# Patient Record
Sex: Female | Born: 1985
Health system: Southern US, Community
[De-identification: ages and names within clinical notes are randomized; demographics above are authoritative.]

## PROBLEM LIST (undated history)

## (undated) DIAGNOSIS — R569 Unspecified convulsions: Secondary | ICD-10-CM

## (undated) DIAGNOSIS — G039 Meningitis, unspecified: Secondary | ICD-10-CM

## (undated) DIAGNOSIS — E162 Hypoglycemia, unspecified: Secondary | ICD-10-CM

## (undated) DIAGNOSIS — F53 Postpartum depression: Secondary | ICD-10-CM

## (undated) DIAGNOSIS — F411 Generalized anxiety disorder: Principal | ICD-10-CM

## (undated) DIAGNOSIS — F419 Anxiety disorder, unspecified: Secondary | ICD-10-CM

## (undated) DIAGNOSIS — R6889 Other general symptoms and signs: Secondary | ICD-10-CM

## (undated) DIAGNOSIS — G9389 Other specified disorders of brain: Secondary | ICD-10-CM

## (undated) HISTORY — DX: Hypoglycemia, unspecified: E16.2

## (undated) HISTORY — DX: Generalized anxiety disorder: F41.1

## (undated) HISTORY — DX: Unspecified convulsions: R56.9

## (undated) HISTORY — DX: Other specified disorders of brain: G93.89

## (undated) HISTORY — PX: TYMPANOSTOMY TUBE PLACEMENT: SHX32

## (undated) HISTORY — DX: Meningitis, unspecified: G03.9

## (undated) HISTORY — DX: Other general symptoms and signs: R68.89

## (undated) HISTORY — DX: Anxiety disorder, unspecified: F41.9

## (undated) HISTORY — PX: EYE SURGERY: SHX253

---
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
## 2015-10-27 ENCOUNTER — Ambulatory Visit (INDEPENDENT_AMBULATORY_CARE_PROVIDER_SITE_OTHER): Payer: BLUE CROSS/BLUE SHIELD | Admitting: Neurology

## 2015-10-27 ENCOUNTER — Encounter: Payer: Self-pay | Admitting: Neurology

## 2015-10-27 VITALS — BP 125/71 | HR 81 | Ht 61.0 in | Wt 106.0 lb

## 2015-10-27 DIAGNOSIS — Z8661 Personal history of infections of the central nervous system: Secondary | ICD-10-CM | POA: Diagnosis not present

## 2015-10-27 DIAGNOSIS — R5383 Other fatigue: Secondary | ICD-10-CM

## 2015-10-27 DIAGNOSIS — R930 Abnormal findings on diagnostic imaging of skull and head, not elsewhere classified: Secondary | ICD-10-CM

## 2015-10-27 NOTE — Progress Notes (Signed)
PATIENT: Chelsea Collins DOB: 05-02-1986  Chief Complaint  Patient presents with  . Heat Intolerance    She has been having intermittent difficulty with heat intolerance.  When this occurs, she also experiences blurred vision, weakness and fatigue.  She has a history of bacterial meningitis and abnormal MRI scans.     HISTORICAL  Chelsea Collins is a 30 years old right-handed female, seen in refer by [facility] Valora Piccolo in October 27 2015 for evaluation of intermittent blurry vision, heat intolerance, fatigue  I reviewed summarized previous neurology record from [facility] by Dr. Clare Gandy June 2012 to October 2014, she was evaluated for sudden onset left side numbness, weakness, she did have a history of infant meningitis, strabismus, field deficit on the left side  She had a history of partial seizure had childhood, last seizure was reported at age 19, presented with staring spells, she was treated with phenobarbital then, was tapered off at age 27, she deny recurrent seizure, but previous neurology note described episodes of staring spells, sudden onset intense conversation without patient wearing off it, there was also episode she was taken to the emergency room found lost in the campus, herself did not know why she was there,  She contributed her previous symptoms for anxiety when she was under a lot of pressure school  Per record, EEG showed no significant abnormality  I was able to review MRI of the brain without contrast from [facility] October 2014, bilateral occipital and encephalomalacia, left worse than right. Visual evoked potential was prolonged on the left side.  Laboratory in October 15 2015, showed normal CMP, TSH, CBC with hemoglobin of 13 point 4, creatinine was 0.63  She denies recurrent seizure, but reported episodes of sudden onset feeling extreme fatigue, blurry vision usually triggered by heat, this happened more frequent in the past few  weeks, per patient, has resolved with decreased the room temperature, there was one particular episode, she complains of blurry vision, to the point of cannot tell apart for 2 people in front of her.  REVIEW OF SYSTEMS: Full 14 system review of systems performed and notable only for fatigue, blurred vision, ALLERGIES: No Known Allergies  HOME MEDICATIONS: Current Outpatient Prescriptions  Medication Sig Dispense Refill  . mirtazapine (REMERON) 15 MG tablet Take by mouth.     No current facility-administered medications for this visit.    PAST MEDICAL HISTORY: Past Medical History  Diagnosis Date  . Meningitis   . Anxiety disorder   . Heat intolerance     PAST SURGICAL HISTORY: Past Surgical History  Procedure Laterality Date  . Eye surgery    . Tympanostomy tube placement      FAMILY HISTORY: Family History  Problem Relation Age of Onset  . Hypertension Maternal Grandfather   . Heart disease Maternal Grandfather     SOCIAL HISTORY:  Social History   Social History  . Marital Status: Single    Spouse Name: N/A  . Number of Children: 0  . Years of Education: 14   Occupational History  . Student     [employer]   Social History Main Topics  . Smoking status: Never Smoker   . Smokeless tobacco: Not on file  . Alcohol Use: No  . Drug Use: No  . Sexual Activity: Not on file   Other Topics Concern  . Not on file   Social History Narrative   Lives with two roommates.     Right-handed.  Two 12oz cans of soda daily.        PHYSICAL EXAM   Filed Vitals:   10/27/15 1017  BP: 125/71  Pulse: 81  Height:  (1.549 m)  Weight: 106 lb (48.081 kg)    Not recorded      Body mass index is 20.04 kg/(m^2).  PHYSICAL EXAMNIATION:  Gen: NAD, conversant, well nourised, obese, well groomed                     Cardiovascular: Regular rate rhythm, no peripheral edema, warm, nontender. Eyes: Conjunctivae clear without exudates or hemorrhage Neck: Supple, no  carotid bruise. Pulmonary: Clear to auscultation bilaterally   NEUROLOGICAL EXAM:  MENTAL STATUS: Speech:    Speech is normal; fluent and spontaneous with normal comprehension.  Cognition:     Orientation to time, place and person     Normal recent and remote memory     Normal Attention span and concentration     Normal Language, naming, repeating,spontaneous speech     Fund of knowledge   CRANIAL NERVES: CN II: Visual fields are full to confrontation. Fundoscopic exam is normal with sharp discs and no vascular changes. Pupils are round equal and briskly reactive to light. CN III, IV, VI: Right is dominant, she has bilateral, she has bilateral esotropia. CN V: Facial sensation is intact to pinprick in all 3 divisions bilaterally. Corneal responses are intact.  CN VII: Face is symmetric with normal eye closure and smile. CN VIII: Hearing is normal to rubbing fingers CN IX, X: Palate elevates symmetrically. Phonation is normal. CN XI: Head turning and shoulder shrug are intact CN XII: Tongue is midline with normal movements and no atrophy.  MOTOR: There is no pronator drift of out-stretched arms. Muscle bulk and tone are normal. Muscle strength is normal.  REFLEXES: Reflexes are 2+ and symmetric at the biceps, triceps, knees, and ankles. Plantar responses are flexor.  SENSORY: Intact to light touch, pinprick, position sense, and vibration sense are intact in fingers and toes.  COORDINATION: Rapid alternating movements and fine finger movements are intact. There is no dysmetria on finger-to-nose and heel-knee-shin.    GAIT/STANCE: She tends to walk with bilateral toes pointed inward, steady   DIAGNOSTIC DATA (LABS, IMAGING, TESTING) - I reviewed patient records, labs, notes, testing and imaging myself where available.   ASSESSMENT AND PLAN  Chelsea Collins is a 30 y.o. female    History of infantile meningitis, bilateral occipital encephalomalacia Previous history of  complex partial seizure Recurrent episode of transient confusion, blurry vision, heat intolerance, fatigue,  Complex partial seizure remain on the differentiation diagnosis,  I have suggested a repeat EEG, even consider empirically treatment with lamotrigine was frequent occurrence,  She decided not to proceed with the test or medication treatment  I have advised her continued document all event, call clinic for any episodes suspicious for seizure  Levert Feinstein, M.D. Ph.D.  [facility] [address] [place], [place] [address] Ph: [phone] Fax: [phone]  CC: Valora Piccolo, PA-C

## 2015-11-20 ENCOUNTER — Telehealth: Payer: Self-pay | Admitting: Neurology

## 2015-11-20 NOTE — Telephone Encounter (Signed)
Pt called about maybe starting any mediation that was suggested. She wants to know if it will affect her in any way. She is an Engineer, mining and wants to know how or if it will change anything for her in the future. Will it impact treating other people with disabilities. She also would like to know if Dr. Terrace Arabia can speak with her Psychiatrist before prescribing medication to make sure medication is compatible or not.  Dr. Ralene Muskrat phone: [phone]. Please call and advise pt at  [phone]

## 2015-11-21 NOTE — Telephone Encounter (Signed)
Pt called back today, said was calling about starting any medication that Dr Terrace ArabiaYan suggest. 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 Is there anything else would be out? And could Dr Terrace ArabiaYan call psych.

## 2015-11-24 MED ORDER — LAMOTRIGINE 100 MG PO TABS
100.0000 mg | ORAL_TABLET | Freq: Two times a day (BID) | ORAL | Status: DC
Start: 2015-11-24 — End: 2015-12-12

## 2015-11-24 MED ORDER — LAMOTRIGINE 25 MG PO TABS: ORAL_TABLET | ORAL | Status: DC

## 2015-11-24 NOTE — Addendum Note (Signed)
Addended by: Levert FeinsteinYAN, Murice Barbar on: 11/24/2015 01:50 PM   Modules accepted: Orders

## 2015-11-24 NOTE — Telephone Encounter (Signed)
Rx placed up front for pick up - patient aware.  She will keep her pending appts for EEG and follow up.

## 2015-11-24 NOTE — Telephone Encounter (Signed)
Patient called back and would like a return call [phone].  She goes to work @ 1:30 but may also be reached after 5:00 at same number.  Thanks!

## 2015-11-24 NOTE — Telephone Encounter (Signed)
[name], please let patient know, I was able to tolerate with a psychiatrist Dr. Ralene Muskratoreen Hughes Fax [phone], office No. [phone].  She had a diagnosis of generalized anxiety disorder, Dr. Kizzie BaneHughes agreed on Lamotrigine, start from 25 mg, titrating to 100 mg twice a day,

## 2015-11-24 NOTE — Telephone Encounter (Addendum)
I have called her psychiatrist Dr. Ralene Muskratoreen Hughes phone: [phone]. Left message twice  I also called patient at  [phone], left message, I have suggested EEG, lamotrigine treatment at her initial visit October 27 2015, she refused both  [name]:  Please call patient, check on her symptoms, we may start lamotrigine treatment, titrating to 100 mg twice a day, potential side effect includes lightheadedness, dizziness, rash,  She should also have EEG, and a follow-up appointment

## 2015-11-26 ENCOUNTER — Telehealth: Payer: Self-pay | Admitting: Neurology

## 2015-11-26 NOTE — Telephone Encounter (Signed)
Left message for a return call

## 2015-11-26 NOTE — Telephone Encounter (Signed)
Reviewed dosing instructions with patient again.  She verbalized understanding.

## 2015-11-26 NOTE — Telephone Encounter (Signed)
She has some questions on the RX that she was giving she wants to know how long she is taking 3 Times a day before she moves up to the full dose. The best number to contact patient is [phone]

## 2015-12-11 ENCOUNTER — Telehealth: Payer: Self-pay | Admitting: Neurology

## 2015-12-11 NOTE — Telephone Encounter (Signed)
The patient called, she has recent started Lamictal within the last 3 weeks, she has developed a slightly tender swollen lymph node just under the jaw on the right side. No associated sore throat, fevers. I do not think that this is a drug allergy, the patient believes that the lymph node is reducing in size at this point. She is to monitor this, if more diffuse involvement of lymph nodes is noted, she is to go to her primary care physician. I would continue the Lamictal for now. There has been no rash.

## 2015-12-12 MED ORDER — LEVETIRACETAM 500 MG PO TABS: ORAL_TABLET | ORAL | Status: DC

## 2015-12-12 NOTE — Telephone Encounter (Signed)
Patient called back to advise she went to PCP and they didn't find any infection. Patient states she now has a rash on arms and legs.

## 2015-12-12 NOTE — Telephone Encounter (Signed)
I called the patient, left a message. The patient will stop the Lamictal. I will call in a prescription for the Keppra that she will start once the rash goes away.

## 2015-12-12 NOTE — Addendum Note (Signed)
Addended by: Stephanie AcreWILLIS, CHARLES on: 12/12/2015 01:40 PM   Modules accepted: Orders, Medications

## 2015-12-12 NOTE — Telephone Encounter (Signed)
Pt called back.  [facility].  She now has rash on legs and upper arms.(red, blotchy).  No other sx, other then swollen lymph glands in front slight larger.  She is not running fever, is tired.  Did have mono test done and was negative.   Will have a repeat on Monday when [facility].  She is on lamictal 150mg  po bid starting today.  (1200-2200) .  Has not taken today.  ? Continue taking lamictal.  Will send to [facility] today.  Told to hold until consulted.  She is using mobile phone.(Home #).

## 2015-12-17 ENCOUNTER — Ambulatory Visit (INDEPENDENT_AMBULATORY_CARE_PROVIDER_SITE_OTHER): Payer: BLUE CROSS/BLUE SHIELD | Admitting: Neurology

## 2015-12-17 ENCOUNTER — Telehealth: Payer: Self-pay | Admitting: Neurology

## 2015-12-17 DIAGNOSIS — R569 Unspecified convulsions: Secondary | ICD-10-CM | POA: Diagnosis not present

## 2015-12-17 NOTE — Procedures (Signed)
   HISTORY: [age] years old female, with history of seizure, bilateral occipital encephalomalacia, presented with intermittent confusion episodes  TECHNIQUE:  16 channel EEG was performed based on standard 10-16 international system. One channel was dedicated to EKG, which has demonstrates sinus rhythm of 54 beats per minutes.  Upon awakening, the posterior background activity was well-developed, in alpha range, 8-9 Hz, reactive to eye opening and closure.  Photic stimulation was performed, which induced a symmetric photic driving.  Hyperventilation was performed twice, during hyperventilation, there was intermittent short bursts of generalized spike sharp wave, which is most noticeable at age 30, 6770, 8171, 66166.  Stage II sleep was achieved.  CONCLUSION: This is an abnormal EEG.  There is electrodiagnostic evidence of short bursting of spikes slow waves induced by hyperventilation, indicating a generalized epilepsy disorder

## 2015-12-17 NOTE — Telephone Encounter (Signed)
I have called Chelsea Collins, her EEG showed bilateral generalized spike slow waves, consistent with epileptiform discharge, she had abnormal MRI of the brain, bilateral occipital encephalomalacia, she continue have spells of confusion, we have tried her on lamotrigine early March 2017, she noticed that bilateral upper extremity rash, was stopped, Dr. Anne HahnWillis has called in Keppra 500 twice a day December 12 2015, she has not started the medication yet  I have advised her to start Keppra 500 mg twice a day, keep follow-up in December 24 2015

## 2015-12-22 ENCOUNTER — Telehealth: Payer: Self-pay | Admitting: Neurology

## 2015-12-22 ENCOUNTER — Ambulatory Visit (INDEPENDENT_AMBULATORY_CARE_PROVIDER_SITE_OTHER): Payer: BLUE CROSS/BLUE SHIELD | Admitting: Neurology

## 2015-12-22 ENCOUNTER — Encounter: Payer: Self-pay | Admitting: Neurology

## 2015-12-22 VITALS — BP 103/69 | HR 95 | Ht 61.0 in | Wt 106.0 lb

## 2015-12-22 DIAGNOSIS — Z8661 Personal history of infections of the central nervous system: Secondary | ICD-10-CM | POA: Diagnosis not present

## 2015-12-22 DIAGNOSIS — R569 Unspecified convulsions: Secondary | ICD-10-CM

## 2015-12-22 MED ORDER — CETIRIZINE HCL 10 MG PO TABS: 10.0000 mg | ORAL_TABLET | Freq: Every day | ORAL | Status: DC

## 2015-12-22 MED ORDER — METHYLPREDNISOLONE 4 MG PO TBPK: ORAL_TABLET | ORAL | Status: DC

## 2015-12-22 MED ORDER — METHYLPREDNISOLONE 4 MG PO TBPK
ORAL_TABLET | ORAL | Status: DC
Start: 2015-12-22 — End: 2015-12-22

## 2015-12-22 MED ORDER — ALPRAZOLAM 0.5 MG PO TABS: ORAL_TABLET | ORAL | Status: DC

## 2015-12-22 NOTE — Telephone Encounter (Signed)
Chelsea Collins called on April second 2017, weekend, she complains of diffuse rash after taking Keppra, I have advised her stopped taking Keppra keep herself well hydration, she has follow-up appointment in December 24 2015  [name], please call patient check on her again,

## 2015-12-22 NOTE — Telephone Encounter (Signed)
Dr. Terrace ArabiaYan spoke with patient and she is being worked into the schedule today.

## 2015-12-22 NOTE — Telephone Encounter (Signed)
Pt called sts her eyes are almost swollen shut, face and neck swollen and is covered in a rash. This call was transferred to Dr Terrace ArabiaYan.

## 2015-12-22 NOTE — Progress Notes (Signed)
Chief Complaint  Patient presents with  . Seizures    She is here to discuss a medication reaction to Keppra.  She started devolping a rash and is now experiencing facial/neck swelling.  She also had a rash with Lamictal.      PATIENT: Chelsea Collins DOB: 1986-02-26  Chief Complaint  Patient presents with  . Seizures    She is here to discuss a medication reaction to Keppra.  She started devolping a rash and is now experiencing facial/neck swelling.  She also had a rash with Lamictal.     HISTORICAL  Chelsea Collins is a 30 years old right-handed female, seen in refer by [facility] Valora Piccolo in October 27 2015 for evaluation of intermittent blurry vision, heat intolerance, fatigue  I reviewed summarized previous neurology record from [facility] by Dr. Clare Gandy June 2012 to October 2014, she was evaluated for sudden onset left side numbness, weakness, she did have a history of infant meningitis, strabismus, field deficit on the left side  She had a history of partial seizure had childhood, last seizure was reported at age 30, presented with staring spells, she was treated with phenobarbital then, was tapered off at age 20, she deny recurrent seizure, but previous neurology note described episodes of staring spells, sudden onset intense conversation without patient wearing off it, there was also episode she was taken to the emergency room found lost in the campus, herself did not know why she was there,  She contributed her previous symptoms for anxiety when she was under a lot of pressure school  Per record, EEG showed no significant abnormality  I was able to review MRI of the brain without contrast from [facility] October 2014, bilateral occipital and encephalomalacia, left worse than right. Visual evoked potential was prolonged on the left side.  Laboratory in October 15 2015, showed normal CMP, TSH, CBC with hemoglobin of 13 point 4, creatinine was  0.63  She denies recurrent seizure, but reported episodes of sudden onset feeling extreme fatigue, blurry vision usually triggered by heat, this happened more frequent in the past few weeks, per patient, has resolved with decreased the room temperature, there was one particular episode, she complains of blurry vision, to the point of cannot tell apart for 2 people in front of her.  UPDATE December 22 2015: She was started on lamotrigine titrating dose since early March 2017, did complain of rash at bilateral anterior thigh, lamotrigine was stopped in December 11 2015, she has not taking any antiepileptic medication from March 24 to December 17 2015, the rash from lamotrigine at bilateral anterior thigh disappeared in 2-3 days around December 14 2015.   I called her for abnormal EEG in December 17 2015 which showed evidence of generalized epileptiform discharge, she started taking Keppra 500 mg half tablet twice a day since December 18 2015, she totally only take 5 of half tablet of Keppra 500 mg, by Saturday April first 2017, she noticed rash at bilateral lateral arm, then quickly spread to involving her arms, face, trunk, has tried Benadryl without benefit, last dose of Keppra 250 mg was April first 2017.  She has no recurrent seizure.  REVIEW OF SYSTEMS: Full 14 system review of systems performed and notable only for fatigue, blurred vision, ALLERGIES: Allergies  Allergen Reactions  . Keppra [Levetiracetam] Swelling and Rash  . Lamictal [Lamotrigine] Rash    HOME MEDICATIONS: Current Outpatient Prescriptions  Medication Sig Dispense Refill  . DiphenhydrAMINE HCl (BENADRYL PO)  Take by mouth as needed.    . mirtazapine (REMERON) 15 MG tablet Take by mouth.     No current facility-administered medications for this visit.    PAST MEDICAL HISTORY: Past Medical History  Diagnosis Date  . Meningitis   . Anxiety disorder   . Heat intolerance     PAST SURGICAL HISTORY: Past Surgical History  Procedure  Laterality Date  . Eye surgery    . Tympanostomy tube placement      FAMILY HISTORY: Family History  Problem Relation Age of Onset  . Hypertension Maternal Grandfather   . Heart disease Maternal Grandfather     SOCIAL HISTORY:  Social History   Social History  . Marital Status: Single    Spouse Name: N/A  . Number of Children: 0  . Years of Education: 14   Occupational History  . Student     [employer]   Social History Main Topics  . Smoking status: Never Smoker   . Smokeless tobacco: Not on file  . Alcohol Use: No  . Drug Use: No  . Sexual Activity: Not on file   Other Topics Concern  . Not on file   Social History Narrative   Lives with two roommates.     Right-handed.   Two 12oz cans of soda daily.        PHYSICAL EXAM   Filed Vitals:   12/22/15 1240  BP: 103/69  Pulse: 95  Height:  (1.549 m)  Weight: 106 lb (48.081 kg)    Not recorded      Body mass index is 20.04 kg/(m^2).  PHYSICAL EXAMNIATION:  Gen: NAD, conversant, well nourised, obese, well groomed                     Cardiovascular: Regular rate rhythm, no peripheral edema, warm, nontender. Eyes: Conjunctivae clear without exudates or hemorrhage Neck: Supple, no carotid bruise. Pulmonary: Clear to auscultation bilaterally  Skin: She has diffuse erythematous rash fused together at her face, arms, chest,  NEUROLOGICAL EXAM:  MENTAL STATUS: Speech:    Speech is normal; fluent and spontaneous with normal comprehension.  Cognition:     Orientation to time, place and person     Normal recent and remote memory     Normal Attention span and concentration     Normal Language, naming, repeating,spontaneous speech     Fund of knowledge   CRANIAL NERVES: CN II: Visual fields are full to confrontation. Fundoscopic exam is normal with sharp discs and no vascular changes. Pupils are round equal and briskly reactive to light. CN III, IV, VI: Right is dominant, she has bilateral, she has  bilateral esotropia. CN V: Facial sensation is intact to pinprick in all 3 divisions bilaterally. Corneal responses are intact.  CN VII: Face is symmetric with normal eye closure and smile. CN VIII: Hearing is normal to rubbing fingers CN IX, X: Palate elevates symmetrically. Phonation is normal. CN XI: Head turning and shoulder shrug are intact CN XII: Tongue is midline with normal movements and no atrophy.  MOTOR: There is no pronator drift of out-stretched arms. Muscle bulk and tone are normal. Muscle strength is normal.  REFLEXES: Reflexes are 2+ and symmetric at the biceps, triceps, knees, and ankles. Plantar responses are flexor.  SENSORY: Intact to light touch, pinprick, position sense, and vibration sense are intact in fingers and toes.  COORDINATION: Rapid alternating movements and fine finger movements are intact. There is no dysmetria on finger-to-nose and  heel-knee-shin.    GAIT/STANCE: She tends to walk with bilateral toes pointed inward, steady   DIAGNOSTIC DATA (LABS, IMAGING, TESTING) - I reviewed patient records, labs, notes, testing and imaging myself where available.   ASSESSMENT AND PLAN  Chelsea Collins is a 30 y.o. female    History of infantile meningitis, bilateral occipital encephalomalacia Complex partial seizure Severe allergic reaction to Keppra  Could not tolerate lamotrigine, develop a rash at bilateral lower extremity,  Severe allergic reaction to Keppra  I have written Metro pack, Zyrtec  Laboratory evaluations   Levert FeinsteinYijun Fortunato Nordin, M.D. Ph.D.  [facility] [address], [address] [place], [place] [address] Ph: [phone] Fax: [phone]  CC: Valora PiccoloMatthew Strupp, PA-C

## 2015-12-23 ENCOUNTER — Telehealth: Payer: Self-pay | Admitting: Neurology

## 2015-12-23 LAB — COMPREHENSIVE METABOLIC PANEL
A/G RATIO: 1.7 (ref 1.2–2.2)
ALBUMIN: 4.3 g/dL (ref 3.5–5.5)
ALT: 35 IU/L — AB (ref 0–32)
AST: 20 IU/L (ref 0–40)
Alkaline Phosphatase: 54 IU/L (ref 39–117)
BUN / CREAT RATIO: 13 (ref 9–23)
BUN: 8 mg/dL (ref 6–20)
Bilirubin Total: 0.7 mg/dL (ref 0.0–1.2)
CALCIUM: 9.3 mg/dL (ref 8.7–10.2)
CO2: 20 mmol/L (ref 18–29)
Chloride: 102 mmol/L (ref 96–106)
Creatinine, Ser: 0.64 mg/dL (ref 0.57–1.00)
GFR, EST AFRICAN AMERICAN: 140 mL/min/{1.73_m2} (ref >59)
GFR, EST NON AFRICAN AMERICAN: 122 mL/min/{1.73_m2} (ref >59)
Globulin, Total: 2.6 g/dL (ref 1.5–4.5)
Glucose: 92 mg/dL (ref 65–99)
POTASSIUM: 4.4 mmol/L (ref 3.5–5.2)
Sodium: 140 mmol/L (ref 134–144)
TOTAL PROTEIN: 6.9 g/dL (ref 6.0–8.5)

## 2015-12-23 LAB — CBC
Hematocrit: 40.5 % (ref 34.0–46.6)
Hemoglobin: 13.5 g/dL (ref 11.1–15.9)
MCH: 29 pg (ref 26.6–33.0)
MCHC: 33.3 g/dL (ref 31.5–35.7)
MCV: 87 fL (ref 79–97)
PLATELETS: 343 10*3/uL (ref 150–379)
RBC: 4.66 x10E6/uL (ref 3.77–5.28)
RDW: 13.3 % (ref 12.3–15.4)
WBC: 7.5 10*3/uL (ref 3.4–10.8)

## 2015-12-23 LAB — ANA W/REFLEX IF POSITIVE: Anti Nuclear Antibody(ANA): NEGATIVE

## 2015-12-23 NOTE — Telephone Encounter (Signed)
Please call her, laboratory evaluation showed no significant abnormality, check on to make sure she is doing well, rashes is not getting worse.

## 2015-12-23 NOTE — Telephone Encounter (Signed)
Spoke to patient - aware of lab results.  She still has a rash but her swelling is getting better.

## 2015-12-24 ENCOUNTER — Ambulatory Visit: Payer: Self-pay | Admitting: Neurology

## 2015-12-24 NOTE — Telephone Encounter (Signed)
Patient called back regarding allergic reaction to Keppra, facial swelling and rash, states no one has called her back.

## 2015-12-24 NOTE — Telephone Encounter (Signed)
Pt said the rt eyelid is puffy in the morning when the medication wears off. This is only occuring during the night because there is a big gap between doses. It is effective when she takes it during the day. Sts the left eyelid is fine. Pt was upset and sts she is not coming back in that this can be figured out over the phone.

## 2015-12-24 NOTE — Telephone Encounter (Signed)
Spoke to Schering-PloughCrystal - reports her rash and swelling have started to improve but she thought her symptoms would be gone completely within two days of starting her steroids.  I explained to her it will take longer for the medications to work and to continue her medications, as prescribed.  Instructed her to call back with any further concerns.

## 2016-01-07 ENCOUNTER — Encounter: Payer: Self-pay | Admitting: Neurology

## 2016-01-07 ENCOUNTER — Ambulatory Visit (INDEPENDENT_AMBULATORY_CARE_PROVIDER_SITE_OTHER): Payer: BLUE CROSS/BLUE SHIELD | Admitting: Neurology

## 2016-01-07 VITALS — BP 98/63 | HR 62 | Ht 61.0 in | Wt 106.0 lb

## 2016-01-07 DIAGNOSIS — Z8661 Personal history of infections of the central nervous system: Secondary | ICD-10-CM

## 2016-01-07 DIAGNOSIS — R569 Unspecified convulsions: Secondary | ICD-10-CM | POA: Diagnosis not present

## 2016-01-07 DIAGNOSIS — R930 Abnormal findings on diagnostic imaging of skull and head, not elsewhere classified: Secondary | ICD-10-CM | POA: Diagnosis not present

## 2016-01-07 MED ORDER — OXCARBAZEPINE 300 MG PO TABS: 300.0000 mg | ORAL_TABLET | Freq: Two times a day (BID) | ORAL | Status: DC

## 2016-01-07 NOTE — Progress Notes (Signed)
Chief Complaint  Patient presents with  . Seizures    She denies any seizure activity. The rash and swelling from her Keppra reaction has now resolved.      PATIENT: Chelsea Collins DOB: Oct 11, 1985  Chief Complaint  Patient presents with  . Seizures    She denies any seizure activity. The rash and swelling from her Keppra reaction has now resolved.     HISTORICAL  Chelsea Collins is a 30 years old right-handed female, seen in refer by [facility] Valora Piccolo in October 27 2015 for evaluation of intermittent blurry vision, heat intolerance, fatigue  I reviewed summarized previous neurology record from [facility] by Dr. Clare Gandy June 2012 to October 2014, she was evaluated for sudden onset left side numbness, weakness, she did have a history of infant meningitis, strabismus, field deficit on the left side  She had a history of partial seizure had childhood, last seizure was reported at age 11, presented with staring spells, she was treated with phenobarbital then, was tapered off at age 48, she deny recurrent seizure, but previous neurology note described episodes of staring spells, sudden onset intense conversation without patient wearing off it, there was also episode she was taken to the emergency room found lost in the campus, herself did not know why she was there,  She contributed her previous symptoms for anxiety when she was under a lot of pressure school  Per record, EEG showed no significant abnormality  I was able to review MRI of the brain without contrast from [facility] October 2014, bilateral occipital and encephalomalacia, left worse than right. Visual evoked potential was prolonged on the left side.  Laboratory in October 15 2015, showed normal CMP, TSH, CBC with hemoglobin of 13 point 4, creatinine was 0.63  She denies recurrent seizure, but reported episodes of sudden onset feeling extreme fatigue, blurry vision usually triggered by heat, this  happened more frequent in the past few weeks, per patient, has resolved with decreased the room temperature, there was one particular episode, she complains of blurry vision, to the point of cannot tell apart for 2 people in front of her.  UPDATE December 22 2015: She was started on lamotrigine titrating dose since early March 2017, did complain of rash at bilateral anterior thigh, lamotrigine was stopped in December 11 2015, she has not taking any antiepileptic medication from March 24 to December 17 2015, the rash from lamotrigine at bilateral anterior thigh disappeared in 2-3 days around December 14 2015.   I called her for abnormal EEG in December 17 2015 which showed evidence of generalized epileptiform discharge, she started taking Keppra 500 mg half tablet twice a day since December 18 2015, she totally only take 5 of half tablet of Keppra 500 mg, by Saturday April first 2017, she noticed rash at bilateral lateral arm, then quickly spread to involving her arms, face, trunk, has tried Benadryl without benefit, last dose of Keppra 250 mg was April first 2017.  She has no recurrent seizure.  UPDATE April 19th 2017: Her severe allergic rash from Keppra has disappeared, laboratory evaluation showed normal CMP CBC negative ANA,  We have discussed long-term antiepileptic medication choice, reviewed related data at [facility], we decided to proceed with Trileptal 300 mg twice a day, which is pregnancy category C, potential side effect explained to her.    REVIEW OF SYSTEMS: Full 14 system review of systems performed and notable only for fatigue, blurred vision, ALLERGIES: Allergies  Allergen Reactions  .  Keppra [Levetiracetam] Swelling and Rash  . Lamictal [Lamotrigine] Rash    HOME MEDICATIONS: Current Outpatient Prescriptions  Medication Sig Dispense Refill  . ALPRAZolam (XANAX) 0.5 MG tablet Take 1 tablet as needed for seizure 30 tablet 0   No current facility-administered medications for this visit.     PAST MEDICAL HISTORY: Past Medical History  Diagnosis Date  . Meningitis   . Anxiety disorder   . Heat intolerance     PAST SURGICAL HISTORY: Past Surgical History  Procedure Laterality Date  . Eye surgery    . Tympanostomy tube placement      FAMILY HISTORY: Family History  Problem Relation Age of Onset  . Hypertension Maternal Grandfather   . Heart disease Maternal Grandfather     SOCIAL HISTORY:  Social History   Social History  . Marital Status: Single    Spouse Name: N/A  . Number of Children: 0  . Years of Education: 14   Occupational History  . Student     [employer]   Social History Main Topics  . Smoking status: Never Smoker   . Smokeless tobacco: Not on file  . Alcohol Use: No  . Drug Use: No  . Sexual Activity: Not on file   Other Topics Concern  . Not on file   Social History Narrative   Lives with two roommates.     Right-handed.   Two 12oz cans of soda daily.        PHYSICAL EXAM   Filed Vitals:   01/07/16 1336  BP: 98/63  Pulse: 62  Height: 5\' 1"  (1.549 m)  Weight: 106 lb (48.081 kg)    Not recorded      Body mass index is 20.04 kg/(m^2).  PHYSICAL EXAMNIATION:  Gen: NAD, conversant, well nourised, obese, well groomed                     Cardiovascular: Regular rate rhythm, no peripheral edema, warm, nontender. Eyes: Conjunctivae clear without exudates or hemorrhage Neck: Supple, no carotid bruise. Pulmonary: Clear to auscultation bilaterally  Skin: She has diffuse erythematous rash fused together at her face, arms, chest,  NEUROLOGICAL EXAM:  MENTAL STATUS: Speech:    Speech is normal; fluent and spontaneous with normal comprehension.  Cognition:     Orientation to time, place and person     Normal recent and remote memory     Normal Attention span and concentration     Normal Language, naming, repeating,spontaneous speech     Fund of knowledge   CRANIAL NERVES: CN II: Peripheral visual field are severely  decreased bilaterally.. Fundoscopic exam is normal with sharp discs and no vascular changes. Pupils are round equal and briskly reactive to light. CN III, IV, VI: Right is dominant, she has bilateral, she has bilateral esotropia. CN V: Facial sensation is intact to pinprick in all 3 divisions bilaterally. Corneal responses are intact.  CN VII: Face is symmetric with normal eye closure and smile. CN VIII: Hearing is normal to rubbing fingers CN IX, X: Palate elevates symmetrically. Phonation is normal. CN XI: Head turning and shoulder shrug are intact CN XII: Tongue is midline with normal movements and no atrophy.  MOTOR: There is no pronator drift of out-stretched arms. Muscle bulk and tone are normal. Muscle strength is normal.  REFLEXES: Reflexes are 2+ and symmetric at the biceps, triceps, knees, and ankles. Plantar responses are flexor.  SENSORY: Intact to light touch, pinprick, position sense, and vibration sense are intact  in fingers and toes.  COORDINATION: Rapid alternating movements and fine finger movements are intact. There is no dysmetria on finger-to-nose and heel-knee-shin.    GAIT/STANCE: She tends to walk with bilateral toes pointed inward, steady   DIAGNOSTIC DATA (LABS, IMAGING, TESTING) - I reviewed patient records, labs, notes, testing and imaging myself where available.   ASSESSMENT AND PLAN  Chelsea Collins is a 30 y.o. female    History of infantile meningitis, bilateral occipital encephalomalacia Complex partial seizure Severe allergic reaction to Keppra  Could not tolerate lamotrigine, develop a rash at bilateral lower extremity,  Severe allergic reaction to Keppra  After prolonged discussion with Merrie, we decided to start her on Trileptal 300 mg twice a day, it is pregnancy category C, I also reviewed known data on pregnancy registry, there was no reported human abnormality based on the database at [facility].   No driving because severe peripheral  visual field defect  Face to face time was 40 minutes, greater than 50% of the time was spent in counseling and coordination of care with the patient   Levert Feinstein, M.D. Ph.D.  [facility] [address], [address] [place], [place] [address] Ph: [phone] Fax: [phone]  CC: Valora Piccolo, PA-C

## 2016-04-06 ENCOUNTER — Ambulatory Visit (INDEPENDENT_AMBULATORY_CARE_PROVIDER_SITE_OTHER): Payer: BLUE CROSS/BLUE SHIELD | Admitting: Neurology

## 2016-04-06 ENCOUNTER — Encounter: Payer: Self-pay | Admitting: Neurology

## 2016-04-06 VITALS — BP 104/68 | HR 89 | Ht 61.0 in | Wt 96.2 lb

## 2016-04-06 DIAGNOSIS — R5383 Other fatigue: Secondary | ICD-10-CM | POA: Diagnosis not present

## 2016-04-06 DIAGNOSIS — Z8661 Personal history of infections of the central nervous system: Secondary | ICD-10-CM | POA: Diagnosis not present

## 2016-04-06 DIAGNOSIS — R930 Abnormal findings on diagnostic imaging of skull and head, not elsewhere classified: Secondary | ICD-10-CM | POA: Diagnosis not present

## 2016-04-06 DIAGNOSIS — R569 Unspecified convulsions: Secondary | ICD-10-CM

## 2016-04-06 NOTE — Progress Notes (Signed)
Chief Complaint  Patient presents with  . Seizures    She is currently taking Trileptal 300mg , one tablet BID.  Reports having a seizure on 03/09/16.  She has also noticed weight loss and needs to discuss her anticonvulsant and psychiatic medications.  Her psychiatrist is Ralene MuskratDoreen Hughes at [facility] in [place] [phone]).      PATIENT: Chelsea Collins DOB: Oct 10, 1985  Chief Complaint  Patient presents with  . Seizures    She is currently taking Trileptal 300mg , one tablet BID.  Reports having a seizure on 03/09/16.  She has also noticed weight loss and needs to discuss her anticonvulsant and psychiatic medications.  Her psychiatrist is Ralene MuskratDoreen Hughes at [facility] in [place] [phone]).     HISTORICAL  Shivonne Collins is a 30 years old right-handed female, [facility] Valora PiccoloMatthew Strupp in October 27 2015 for evaluation of intermittent blurry vision, heat intolerance, fatigue  I reviewed summarized previous neurology record from [facility] by Dr. Clare Gandyavid Meyer,from June 2012 to October 2014, she was evaluated for sudden onset left side numbness, weakness, she did have a history of infant meningitis, strabismus, field deficit on the left side  She had a history of partial seizure at childhood, last seizure was reported at age 89, presented with staring spells, she was treated with phenobarbital then, was tapered off at age 30, she deny recurrent seizure, but previous neurology note described episodes of staring spells, sudden onset intense conversation without patient aware of it, there was also episode she was taken to the emergency room found lost in the campus, herself did not know why she was there,  She contributed her previous symptoms for anxiety when she was under a lot of pressure school  Per record, EEG showed no significant abnormality  I was able to review MRI of the brain without contrast [facility] October 2014, bilateral  occipital and encephalomalacia, left worse than right. Visual evoked potential was prolonged on the left side.  Laboratory in October 15 2015, showed normal CMP, TSH, CBC with hemoglobin of 13 point 4, creatinine was 0.63  She denies recurrent seizure, but reported episodes of sudden onset feeling extreme fatigue, blurry vision usually triggered by heat, this happened more frequent in the past few weeks, per patient, has resolved with decreased the room temperature, there was one particular episode, she complains of blurry vision, to the point of cannot tell apart for 2 people in front of her.  UPDATE December 22 2015: She was started on lamotrigine titrating dose since early March 2017, did complain of rash at bilateral anterior thigh, lamotrigine was stopped in December 11 2015, she has not taking any antiepileptic medication from March 24 to December 17 2015, the rash from lamotrigine at bilateral anterior thigh disappeared in 2-3 days around December 14 2015.   I called her for abnormal EEG in December 17 2015 which showed evidence of generalized epileptiform discharge, she started taking Keppra 500 mg half tablet twice a day since December 18 2015, she totally only take 5 of half tablet of Keppra 500 mg, by Saturday April first 2017, she noticed rash at bilateral lateral arm, then quickly spread to involving her arms, face, trunk, has tried Benadryl without benefit, last dose of Keppra 250 mg was April first 2017.  She has no recurrent seizure.  UPDATE April 19th 2017: Her severe allergic rash from Keppra has disappeared, laboratory evaluation showed normal CMP CBC negative ANA,  We  have discussed long-term antiepileptic medication choice, reviewed related data at [facility], we decided to proceed with Trileptal 300 mg twice a day, which is pregnancy category C, potential side effect explained to her.   UPDATE July 18th 2017: She had one episode suspicious for seizure in June 2017,  She was sitting at the  edge of the pool with her fee at the water, suddenly she had blurry vision, this happened in this scenario she was forced to swim, when she feel very nervous about it, this particular episode could be panic attack, it is not her typical seizure episodes.  she is tolerating trileptal  bid, She is overly stressed for few months because of her schoolwork,.  She has lost 10 Lb over last 3 months, she has stopped take mirtazapine  REVIEW OF SYSTEMS: Full 14 system review of systems performed and notable only for fatigue, blurred vision, ALLERGIES: Allergies  Allergen Reactions  . Keppra [Levetiracetam] Swelling and Rash  . Lamictal [Lamotrigine] Rash    HOME MEDICATIONS: Current Outpatient Prescriptions  Medication Sig Dispense Refill  . Oxcarbazepine (TRILEPTAL) 300 MG tablet Take 1 tablet (300 mg total) by mouth 2 (two) times daily. 60 tablet 11   No current facility-administered medications for this visit.    PAST MEDICAL HISTORY: Past Medical History  Diagnosis Date  . Meningitis   . Anxiety disorder   . Heat intolerance     PAST SURGICAL HISTORY: Past Surgical History  Procedure Laterality Date  . Eye surgery    . Tympanostomy tube placement      FAMILY HISTORY: Family History  Problem Relation Age of Onset  . Hypertension Maternal Grandfather   . Heart disease Maternal Grandfather     SOCIAL HISTORY:  Social History   Social History  . Marital Status: Single    Spouse Name: N/A  . Number of Children: 0  . Years of Education: 14   Occupational History  . Student     [employer]   Social History Main Topics  . Smoking status: Never Smoker   . Smokeless tobacco: Not on file  . Alcohol Use: No  . Drug Use: No  . Sexual Activity: Not on file   Other Topics Concern  . Not on file   Social History Narrative   Lives with two roommates.     Right-handed.   Two 12oz cans of soda daily.        PHYSICAL EXAM   Filed Vitals:   04/06/16 1414  BP: 104/68    Pulse: 89  Height:  (1.549 m)  Weight: 96 lb 4 oz (43.659 kg)    Not recorded      Body mass index is 18.2 kg/(m^2).  PHYSICAL EXAMNIATION:  Gen: NAD, conversant, well nourised, obese, well groomed                     Cardiovascular: Regular rate rhythm, no peripheral edema, warm, nontender. Eyes: Conjunctivae clear without exudates or hemorrhage Neck: Supple, no carotid bruise. Pulmonary: Clear to auscultation bilaterally  Skin: She has diffuse erythematous rash fused together at her face, arms, chest,  NEUROLOGICAL EXAM:  MENTAL STATUS: Speech:    Speech is normal; fluent and spontaneous with normal comprehension.  Cognition:     Orientation to time, place and person     Normal recent and remote memory     Normal Attention span and concentration     Normal Language, naming, repeating,spontaneous speech     [employer]  of knowledge   CRANIAL NERVES: CN II: Peripheral visual field are severely decreased bilaterally.. Fundoscopic exam is normal with sharp discs and no vascular changes. Pupils are round equal and briskly reactive to light. CN III, IV, VI: Right is dominant, she has bilateral, she has bilateral esotropia. CN V: Facial sensation is intact to pinprick in all 3 divisions bilaterally. Corneal responses are intact.  CN VII: Face is symmetric with normal eye closure and smile. CN VIII: Hearing is normal to rubbing fingers CN IX, X: Palate elevates symmetrically. Phonation is normal. CN XI: Head turning and shoulder shrug are intact CN XII: Tongue is midline with normal movements and no atrophy.  MOTOR: There is no pronator drift of out-stretched arms. Muscle bulk and tone are normal. Muscle strength is normal.  REFLEXES: Reflexes are 2+ and symmetric at the biceps, triceps, knees, and ankles. Plantar responses are flexor.  SENSORY: Intact to light touch, pinprick, position sense, and vibration sense are intact in fingers and toes.  COORDINATION: Rapid  alternating movements and fine finger movements are intact. There is no dysmetria on finger-to-nose and heel-knee-shin.    GAIT/STANCE: She tends to walk with bilateral toes pointed inward, steady   DIAGNOSTIC DATA (LABS, IMAGING, TESTING) - I reviewed patient records, labs, notes, testing and imaging myself where available.   ASSESSMENT AND PLAN  Tamitha Collins is a 30 y.o. female    History of infantile meningitis, bilateral occipital encephalomalacia Complex partial seizure Severe allergic reaction to Keppra  Could not tolerate lamotrigine, develop a rash at bilateral lower extremity,  Severe allergic reaction to Keppra  She is overall doing well onTrileptal 300 mg twice a day,   No driving because severe peripheral visual field defect     Levert Feinstein, M.D. Ph.D.  [facility] [address], [address] [place], [place] [address] Ph: [phone] Fax: [phone]  CC: Valora Piccolo, PA-C

## 2016-10-07 ENCOUNTER — Ambulatory Visit: Payer: BLUE CROSS/BLUE SHIELD | Admitting: Adult Health

## 2016-10-07 ENCOUNTER — Ambulatory Visit: Payer: BLUE CROSS/BLUE SHIELD | Admitting: Neurology

## 2016-11-01 ENCOUNTER — Ambulatory Visit (INDEPENDENT_AMBULATORY_CARE_PROVIDER_SITE_OTHER): Payer: BLUE CROSS/BLUE SHIELD | Admitting: Neurology

## 2016-11-01 ENCOUNTER — Encounter: Payer: Self-pay | Admitting: Neurology

## 2016-11-01 VITALS — BP 110/70 | HR 62 | Ht 61.0 in | Wt 91.0 lb

## 2016-11-01 DIAGNOSIS — R569 Unspecified convulsions: Secondary | ICD-10-CM | POA: Diagnosis not present

## 2016-11-01 DIAGNOSIS — Z8661 Personal history of infections of the central nervous system: Secondary | ICD-10-CM | POA: Diagnosis not present

## 2016-11-01 MED ORDER — LACOSAMIDE 100 MG PO TABS: 100.0000 mg | ORAL_TABLET | Freq: Two times a day (BID) | ORAL | 11 refills | Status: DC

## 2016-11-01 NOTE — Progress Notes (Signed)
Chief Complaint  Patient presents with  . Seizures    She is here with her fiance, Chelsea NeedleMichael.  They are getting married in May and she would like to start birth control.  She would like to discuss her Trileptal and the interaction it has with birth control.  No seizure activity reported.      PATIENT: Chelsea Collins DOB: Jan 04, 1986  Chief Complaint  Patient presents with  . Seizures    She is here with her fiance, Chelsea NeedleMichael.  They are getting married in May and she would like to start birth control.  She would like to discuss her Trileptal and the interaction it has with birth control.  No seizure activity reported.     HISTORICAL  Chelsea Collins is a 31 years old right-handed female, [facility] Valora PiccoloMatthew Strupp in October 27 2015 for evaluation of intermittent blurry vision, heat intolerance, fatigue  I reviewed summarized previous neurology record from [facility] by Dr. Clare Gandyavid Meyer,from June 2012 to October 2014, she was evaluated for sudden onset left side numbness, weakness, she did have a history of infant meningitis, strabismus, field deficit on the left side  She had a history of partial seizure at childhood, last seizure was reported at age 89, presented with staring spells, she was treated with phenobarbital then, was tapered off at age 31, she deny recurrent seizure, but previous neurology note described episodes of staring spells, sudden onset intense conversation without patient aware of it, there was also episode she was taken to the emergency room found lost in the campus, herself did not know why she was there,  She contributed her previous symptoms for anxiety when she was under a lot of pressure school  Per record, EEG showed no significant abnormality  I was able to review MRI of the brain without contrast from [facility] October 2014, bilateral occipital and encephalomalacia, left worse than right. Visual evoked potential was prolonged on the  left side.  Laboratory in October 15 2015, showed normal CMP, TSH, CBC with hemoglobin of 13 point 4, creatinine was 0.63  She denies recurrent seizure, but reported episodes of sudden onset feeling extreme fatigue, blurry vision usually triggered by heat, this happened more frequent in the past few weeks, per patient, has resolved with decreased the room temperature, there was one particular episode, she complains of blurry vision, to the point of cannot tell apart for 2 people in front of her.  UPDATE December 22 2015: She was started on lamotrigine titrating dose since early March 2017, did complain of rash at bilateral anterior thigh, lamotrigine was stopped in December 11 2015, she has not taking any antiepileptic medication from March 24 to December 17 2015, the rash from lamotrigine at bilateral anterior thigh disappeared in 2-3 days around December 14 2015.   I called her for abnormal EEG in December 17 2015 which showed evidence of generalized epileptiform discharge, she started taking Keppra 500 mg half tablet twice a day since December 18 2015, she totally only take 5 of half tablet of Keppra 500 mg, by Saturday April first 2017, she noticed rash at bilateral lateral arm, then quickly spread to involving her arms, face, trunk, has tried Benadryl without benefit, last dose of Keppra 250 mg was April first 2017.  She has no recurrent seizure.  UPDATE April 19th 2017: Her severe allergic rash from Keppra has disappeared, laboratory evaluation showed normal CMP CBC negative ANA,  We have discussed long-term antiepileptic  medication choice, reviewed related data at [facility], we decided to proceed with Trileptal 300 mg twice a day, which is pregnancy category C, potential side effect explained to her.   UPDATE July 18th 2017: She had one episode suspicious for seizure in June 2017,  She was sitting at the edge of the pool with her fee at the water, suddenly she had blurry vision, this happened in this  scenario she was forced to swim, when she feel very nervous about it, this particular episode could be panic attack, it is not her typical seizure episodes.  she is tolerating trileptal 300mg  bid, She is overly stressed for few months because of her schoolwork,.  She has lost 10 Lb over last 3 months, she has stopped take mirtazapine  UPDATE Feb 12th 2018: She is with her fianc at today's clinical visit, had no recurrent spells after taking Trileptal 300 mg twice a day, previously developed rash with lamotrigine, and Keppra,  She is going to be put on contraceptives, concerned about the potential interaction with Trileptal, after discussion, we decided to switch to Vimpat 100 mg twice a day, potential side effect including unknown side effect for fetus was explained to her, she voiced understanding,   REVIEW OF SYSTEMS: Full 14 system review of systems performed and notable only for fatigue, blurred vision, ALLERGIES: Allergies  Allergen Reactions  . Keppra [Levetiracetam] Swelling and Rash  . Lamictal [Lamotrigine] Rash    HOME MEDICATIONS: Current Outpatient Prescriptions  Medication Sig Dispense Refill  . mirtazapine (REMERON) 15 MG tablet Take 15 mg by mouth at bedtime.    . Oxcarbazepine (TRILEPTAL) 300 MG tablet Take 1 tablet (300 mg total) by mouth 2 (two) times daily. 60 tablet 11   No current facility-administered medications for this visit.     PAST MEDICAL HISTORY: Past Medical History:  Diagnosis Date  . Anxiety disorder   . Heat intolerance   . Meningitis     PAST SURGICAL HISTORY: Past Surgical History:  Procedure Laterality Date  . EYE SURGERY    . TYMPANOSTOMY TUBE PLACEMENT      FAMILY HISTORY: Family History  Problem Relation Age of Onset  . Hypertension Maternal Grandfather   . Heart disease Maternal Grandfather     SOCIAL HISTORY:  Social History   Social History  . Marital status: Single    Spouse name: N/A  . Number of children: 0  .  Years of education: 14   Occupational History  . Student     [employer]   Social History Main Topics  . Smoking status: Never Smoker  . Smokeless tobacco: Never Used  . Alcohol use No  . Drug use: No  . Sexual activity: Not on file   Other Topics Concern  . Not on file   Social History Narrative   Lives with two roommates.     Right-handed.   Two 12oz cans of soda daily.        PHYSICAL EXAM   Vitals:   11/01/16 0725  BP: 110/70  Pulse: 62  Weight: 91 lb (41.3 kg)  Height: 5\' 1"  (1.549 m)    Not recorded      Body mass index is 17.19 kg/m.  PHYSICAL EXAMNIATION:  Gen: NAD, conversant, well nourised, obese, well groomed                     Cardiovascular: Regular rate rhythm, no peripheral edema, warm, nontender. Eyes: Conjunctivae clear without exudates or hemorrhage Neck:  Supple, no carotid bruise. Pulmonary: Clear to auscultation bilaterally  Skin: She has diffuse erythematous rash fused together at her face, arms, chest,  NEUROLOGICAL EXAM:  MENTAL STATUS: Speech:    Speech is normal; fluent and spontaneous with normal comprehension.  Cognition:     Orientation to time, place and person     Normal recent and remote memory     Normal Attention span and concentration     Normal Language, naming, repeating,spontaneous speech     Fund of knowledge   CRANIAL NERVES: CN II: Peripheral visual field are severely decreased bilaterally.. Fundoscopic exam is normal with sharp discs and no vascular changes. Pupils are round equal and briskly reactive to light. CN III, IV, VI: Right is dominant, she has bilateral, she has bilateral esotropia. CN V: Facial sensation is intact to pinprick in all 3 divisions bilaterally. Corneal responses are intact.  CN VII: Face is symmetric with normal eye closure and smile. CN VIII: Hearing is normal to rubbing fingers CN IX, X: Palate elevates symmetrically. Phonation is normal. CN XI: Head turning and shoulder shrug are  intact CN XII: Tongue is midline with normal movements and no atrophy.  MOTOR: There is no pronator drift of out-stretched arms. Muscle bulk and tone are normal. Muscle strength is normal.  REFLEXES: Reflexes are 2+ and symmetric at the biceps, triceps, knees, and ankles. Plantar responses are flexor.  SENSORY: Intact to light touch, pinprick, position sense, and vibration sense are intact in fingers and toes.  COORDINATION: Rapid alternating movements and fine finger movements are intact. There is no dysmetria on finger-to-nose and heel-knee-shin.    GAIT/STANCE: She tends to walk with bilateral toes pointed inward, steady   DIAGNOSTIC DATA (LABS, IMAGING, TESTING) - I reviewed patient records, labs, notes, testing and imaging myself where available.   ASSESSMENT AND PLAN  Sarahi Collins is a 31 y.o. female    History of infantile meningitis, bilateral occipital encephalomalacia Complex partial seizure Severe allergic reaction to Keppra, and lamotrigine   After discuss with patient, we decided to switch her to Vimpat 100 mg twice a day, other option would be Topamax low-dose less than 200 mg daily  No driving because severe peripheral visual field defect     Levert Feinstein, M.D. Ph.D.  [facility] [address], [address] [place], [place] [address] Ph: [phone] Fax: [phone]  CC: Valora Piccolo, PA-C

## 2017-01-24 ENCOUNTER — Ambulatory Visit (INDEPENDENT_AMBULATORY_CARE_PROVIDER_SITE_OTHER): Payer: BLUE CROSS/BLUE SHIELD | Admitting: Neurology

## 2017-01-24 ENCOUNTER — Encounter: Payer: Self-pay | Admitting: Neurology

## 2017-01-24 VITALS — BP 98/46 | HR 102 | Temp 98.1°F | Ht 60.75 in | Wt 96.0 lb

## 2017-01-24 DIAGNOSIS — G40309 Generalized idiopathic epilepsy and epileptic syndromes, not intractable, without status epilepticus: Secondary | ICD-10-CM | POA: Diagnosis not present

## 2017-01-24 MED ORDER — OXCARBAZEPINE 300 MG PO TABS: 300.0000 mg | ORAL_TABLET | Freq: Two times a day (BID) | ORAL | 11 refills | Status: DC

## 2017-01-24 NOTE — Patient Instructions (Addendum)
1. Restart Trileptal 300mg : Take 1/2 tablet twice a day for 1 week, then increase to 1 tablet twice a day 2. After 2 weeks of taking Trileptal, start reducing Vimpat 100mg  to 1/2 tablet twice a day for a week, then reduce to 1/2 tablet daily for 1 week, then stop 3. Follow-up in 3 months, call for any changes  Seizure Precautions: 1. If medication has been prescribed for you to prevent seizures, take it exactly as directed.  Do not stop taking the medicine without talking to your doctor first, even if you have not had a seizure in a long time.   2. Avoid activities in which a seizure would cause danger to yourself or to others.  Don't operate dangerous machinery, swim alone, or climb in high or dangerous places, such as on ladders, roofs, or girders.  Do not drive unless your doctor says you may.  3. If you have any warning that you may have a seizure, lay down in a safe place where you can't hurt yourself.    4.  No driving for 6 months from last seizure, [place].   Please refer to the following link on the Epilepsy Foundation of America's website for more information: http://www.epilepsyfoundation.org/answerplace/Social/driving/drivingu.cfm   5.  Maintain good sleep hygiene. Avoid alcohol  6.  Notify your neurology if you are planning pregnancy or if you become pregnant.  7.  Contact your doctor if you have any problems that may be related to the medicine you are taking.  8.  Call 911 and bring the patient back to the ED if:        A.  The seizure lasts longer than 5 minutes.       B.  The patient doesn't awaken shortly after the seizure  C.  The patient has new problems such as difficulty seeing, speaking or moving  D.  The patient was injured during the seizure  E.  The patient has a temperature over 102 F (39C)  F.  The patient vomited and now is having trouble breathing

## 2017-01-24 NOTE — Progress Notes (Signed)
NEUROLOGY CONSULTATION NOTE  Chelsea Collins MRN: [id number] DOB: 09-02-1986  Referring provider: Dr. Roland Rack Primary care provider: Dr. Roland Rack  Reason for consult:  seizures  Dear Dr Louanna Raw:  Thank you for your kind referral of Chelsea Collins for consultation of the above symptoms. Although her history is well known to you, please allow me to reiterate it for the purpose of our medical record. Records and images were personally reviewed where available.  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 She was diagnosed with epilepsy in childhood when she was having staring spells. She reports that seizures stopped at age 75 and she was taken off seizure medication at age 31. She denies any myoclonic jerks or convulsions. In 2012, she started having episodes where her left left would become weak for a minute or so, sometimes it felt like her left arm was also affected. She had an MRI brain in [place] reported as showing previous injury to the occipital lobe. She saw a neurologist at that time with concern for MS, she was reassured there was no evidence of MS. In 2014, she was in a classroom and had an episode where she apparently walked toward the wall and stood there just staring and unresponsive. She was amnestic of the event until her supervisor called her to the office and told her what happened. She recalls another episode that same semester, she was stressed and waiting in the lobby, then did not know how she got there. She had an MRI brain without contrast ehich did not show any acute changes, there was note of ulceration in the calcarine cortex of both hemispheres, left greater than right, increased FLAIR signal going into the deep while ammer with decreased signal centrally. Wake and drowsy EEG was normal. When she left  school, she reports episodes were not as bad, she was still having difficulties with her left side and started having troubles with anxiety as well. She would be at a store and would not remember a word or a color. 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 She had to sit on the floor and was pale. When she moved to [place], she started having episodes of feeling hot, nauseated, weird, pale, as well as zoning out ("walking into traffic"). She saw neurologist Dr. Terrace Arabia and had a repeat EEG which was abnormal, during hyperventilation which was performed twice, there were intermittent short bursts of generalized spike sharp wave, indicating a generalized epilepsy disorder. She was started on Lamotrigine but 5 weeks later had a severe allergic reaction with swollen lymph nodes, night sweats, then rash. She was switched to Keppra and had a more severe immediate reaction within 1-2 days with rash and facial swelling. She was then switched to Trileptal which she tolerated well, and reports that all the symptoms she was having went away. She last saw Dr. Terrace Arabia in February to discuss Trileptal and contraceptives, and made the decision to switch to Vimpat. She presents today asking to be switched back to Trileptal, stating that the Vimpat was not controlling her symptoms. She would have episodes of turning Chelsea Collins, nausea, vision change, staring off into space unresponsive. She  was also having headaches with vomiting. Her psychiatrist was concerned that Remeron was also contributing to symptoms, but she had been taking Remeron with Trileptal without these symptoms.   She also reports episodes where she would have a sudden speech impediment lasting 30 seconds, where she knows what she wants to say but cannot get the words out or form the words. Twice  in the past 2 years she has felt a sensation of fear prior to the speech difficulties. She denies any olfactory/gustatory hallucinations, the left-sided symptoms have not recurred since 2014, no myoclonic jerks. She has had headaches every other day when she is late to take her Vimpat dose, resolving after she takes the medication. Pain is around her eyes with some photosensitivity, no nausea/vomiting. She denies any diplopia, dizziness, neck/back pain, bowel/bladder dysfunction.  Epilepsy Risk Factors:  Bacterial meningitis in infancy with encephalomalacia in the bilateral occipital lobes. Her maternal cousin had seizures in childhood, a maternal uncle had 2 seizures felt to be related to glucose levels. Otherwise she had a normal birth and early development.  There is no history of febrile convulsions, significant traumatic brain injury, neurosurgical procedures.  Prior AEDs: Keppra, Lamotrigine, Trileptal  PAST MEDICAL HISTORY: Past Medical History:  Diagnosis Date  . Anxiety disorder   . Heat intolerance   . Meningitis     PAST SURGICAL HISTORY: Past Surgical History:  Procedure Laterality Date  . EYE SURGERY    . TYMPANOSTOMY TUBE PLACEMENT      MEDICATIONS: Current Outpatient Prescriptions on File Prior to Visit  Medication Sig Dispense Refill  . Lacosamide (VIMPAT) 100 MG TABS Take 1 tablet (100 mg total) by mouth 2 (two) times daily. 60 tablet 11  . mirtazapine (REMERON) 15 MG tablet Take 15 mg by mouth at bedtime.    . Oxcarbazepine (TRILEPTAL) 300 MG tablet Take 1 tablet (300 mg total) by mouth 2 (two) times daily. (Patient not taking: Reported on 01/24/2017) 60 tablet 11   No current facility-administered medications on file prior to visit.     ALLERGIES: Allergies  Allergen Reactions  . Keppra [Levetiracetam] Swelling and Rash  . Lamictal [Lamotrigine] Rash    FAMILY HISTORY: Family History  Problem Relation Age of Onset  . Hypertension Maternal Grandfather   .  Heart disease Maternal Grandfather     SOCIAL HISTORY: Social History   Social History  . Marital status: Single    Spouse name: N/A  . Number of children: 0  . Years of education: 14   Occupational History  . Student     [employer]   Social History Main Topics  . Smoking status: Never Smoker  . Smokeless tobacco: Never Used  . Alcohol use No  . Drug use: No  . Sexual activity: Not on file   Other Topics Concern  . Not on file   Social History Narrative   Lives with two roommates.     Right-handed.   Two 12oz cans of soda daily.       REVIEW OF SYSTEMS: Constitutional: No fevers, chills, or sweats, no generalized fatigue, change in appetite Eyes: No visual changes, double vision, eye pain Ear, nose and throat: No hearing loss, ear pain, nasal congestion, sore throat Cardiovascular: No chest pain, palpitations Respiratory:  No shortness of breath at rest or with exertion, wheezes GastrointestinaI: No nausea, vomiting, diarrhea, abdominal pain, fecal incontinence Genitourinary:  No dysuria, urinary retention or frequency Musculoskeletal:  No neck pain, back pain Integumentary: No rash, pruritus, skin lesions  Neurological: as above Psychiatric: No depression, insomnia, anxiety Endocrine: No palpitations, fatigue, diaphoresis, mood swings, change in appetite, change in weight, increased thirst Hematologic/Lymphatic:  No anemia, purpura, petechiae. Allergic/Immunologic: no itchy/runny eyes, nasal congestion, recent allergic reactions, rashes  PHYSICAL EXAM: Vitals:   01/24/17 1005  BP: (!) 98/46  Pulse: (!) 102  Temp: 98.1 F (36.7 C)   General: No acute distress Head:  Normocephalic/atraumatic Eyes: Fundoscopic exam shows bilateral sharp discs, no vessel changes, exudates, or hemorrhages Neck: supple, no paraspinal tenderness, full range of motion Back: No paraspinal tenderness Heart: regular rate and rhythm Lungs: Clear to auscultation bilaterally. Vascular: No  carotid bruits. Skin/Extremities: No rash, no edema Neurological Exam: Mental status: alert and oriented to person, place, and time, no dysarthria or aphasia, Fund of knowledge is appropriate.  Recent and remote memory are intact. 3/3 delayed recall. Attention and concentration are normal.    Able to name objects and repeat phrases. Cranial nerves: CN I: not tested CN II: pupils equal, round and reactive to light, visual acuity 20/40 OD 20/50 OS, peripheral field deficits bilaterally, she sees movement in the superior quadrants, unable to see in the inferior quadrants, fundi unremarkable. CN III, IV, VI:  Limited left eye abduction with left esotropia, no nystagmus, no ptosis CN V: facial sensation intact CN VII: upper and lower face symmetric CN VIII: hearing intact to finger rub CN IX, X: gag intact, uvula midline CN XI: sternocleidomastoid and trapezius muscles intact CN XII: tongue midline Bulk & Tone: normal, no fasciculations. Motor: 5/5 throughout with no pronator drift. Sensation: intact to light touch, cold, pin, vibration and joint position sense.  No extinction to double simultaneous stimulation.  Romberg test negative Deep Tendon Reflexes: +2 throughout, no ankle clonus Plantar responses: downgoing bilaterally Cerebellar: no incoordination on finger to nose, heel to shin. No dysdiadochokinesia Gait: narrow-based and steady, able to tandem walk adequately. Tremor: none  IMPRESSION: This is a pleasant 31 year old right-handed woman with a history of bacterial meningitis in infancy with bilateral occipital encephalomalacia, seizures in childhood with staring spells that initially stopped at age 89 but recurred in 2014. She also started having recurrent episodes of left-sided weakness in 2014, denies any further similar symptoms since then. She reports recurrent episodes where she becomes pale, nauseated, and has had staring spells that stopped after Trileptal, but recurred with switch  to Vimpat. EEG showed generalized discharged during hyperventilation suggestive of a generalized epilepsy. She is getting married this week and is not as worried about contraceptives as before, wanting to resume Trileptal. We had an extensive discussion about issues in women with epilepsy, including Trileptal and pregnancy, starting a daily folic acid. She will restart Trileptal 300mg  1/2 tab BID x 1 week, then increase to 1 tab BID. She will continue taking Vimpat for another 2 weeks, then start tapering down as instructed. She does not drive. She will follow-up in 3 months and knows to call for any changes.   Thank you for allowing me to participate in the care of this patient. Please do not hesitate to call for any questions or concerns.   Patrcia DollyKaren Aquino, M.D.  CC: Dr. Louanna RawAbbott

## 2017-01-30 DIAGNOSIS — G40909 Epilepsy, unspecified, not intractable, without status epilepticus: Secondary | ICD-10-CM | POA: Insufficient documentation

## 2017-02-07 ENCOUNTER — Telehealth: Payer: Self-pay | Admitting: Neurology

## 2017-02-07 NOTE — Telephone Encounter (Signed)
Patient called and states that [facility] when she was here in the office at her appt.  On 01-24-17. She stating she needs this today please call her when ready [phone]

## 2017-02-07 NOTE — Telephone Encounter (Signed)
Patient needs a letter for work but Casimiro NeedleMichael ( who called in ) was unsure of what it needed to say. He said that you could call patient at [phone] and if you didn't get her you could call him at [phone]

## 2017-02-07 NOTE — Telephone Encounter (Signed)
LMOM letting pt know that I had faxed her paperwork to her employer on Thursday, May 17th, 2018

## 2017-03-03 ENCOUNTER — Ambulatory Visit: Payer: Self-pay | Admitting: Neurology

## 2017-04-21 ENCOUNTER — Ambulatory Visit (INDEPENDENT_AMBULATORY_CARE_PROVIDER_SITE_OTHER): Payer: 59 | Admitting: Neurology

## 2017-04-21 ENCOUNTER — Encounter: Payer: Self-pay | Admitting: Neurology

## 2017-04-21 VITALS — BP 118/70 | HR 75 | Ht 60.75 in | Wt 94.0 lb

## 2017-04-21 DIAGNOSIS — G40309 Generalized idiopathic epilepsy and epileptic syndromes, not intractable, without status epilepticus: Secondary | ICD-10-CM | POA: Diagnosis not present

## 2017-04-21 MED ORDER — OXCARBAZEPINE 300 MG PO TABS: 300.0000 mg | ORAL_TABLET | Freq: Two times a day (BID) | ORAL | 3 refills | Status: DC

## 2017-04-21 NOTE — Progress Notes (Signed)
NEUROLOGY FOLLOW UP OFFICE NOTE  Chelsea Collins [id number] [birth year]  HISTORY OF PRESENT ILLNESS: I had the pleasure of seeing Chelsea Collins in follow-up in the neurology clinic on 04/21/2017. She is accompanied by her husband who helps supplement the history today. The patient was last seen 3 months ago for primary generalized epilepsy. She reported improvement of symptoms with Trileptal that was previously prescribed, and asked to be tapered off Vimpat on her last visit in May. Since her last visit, she and her husband deny and seizures or seizure-like symptoms. She has been doing very well on the Trileptal but wanted to come today to discuss if she needed to switch medication due to pregnancy planning for next year. They are scheduled for preconception counseling in December. She denies any staring/unresponsive episodes, gaps in time, speech changes, olfactory/gustatory hallucinations, focal numbness/tingling/weakness, myoclonic jerks. No headaches, dizziness, diplopia, neck/back pain, no falls.   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 She was diagnosed with epilepsy in childhood when she was having staring spells. She reports that seizures stopped at age 89 and she was taken off seizure medication at age 31. She denies any myoclonic jerks or convulsions. In 2012, she started having episodes where her left leg would become weak for a minute or so, sometimes it felt like her left arm was also affected. She had an MRI brain in [place] reported as showing previous injury to the occipital lobe. She saw a neurologist at that time with concern for MS, she was reassured there was no evidence of MS. In 2014, she was in a classroom and had an episode where she apparently walked toward the wall and stood there just staring and unresponsive. She was amnestic of the event until her supervisor called  her to the office and told her what happened. She recalls another episode that same semester, she was stressed and waiting in the lobby, then did not know how she got there. She had an MRI brain without contrast which did not show any acute changes, there was note of ulceration in the calcarine cortex of both hemispheres, left greater than right, increased FLAIR signal going into the deep while matter with decreased signal centrally. Wake and drowsy EEG was normal. When she left school, she reports episodes were not as bad, she was still having difficulties with her left side and started having troubles with anxiety as well. She would be at a store and would not remember a word or a color. 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 She had to sit on the floor and was pale. When she moved to [place], she started having episodes of feeling hot, nauseated, weird, pale, as well as zoning out ("walking into traffic"). She saw neurologist Dr. Terrace ArabiaYan and had a repeat EEG which was abnormal, during hyperventilation which was performed twice, there were intermittent short bursts of generalized spike sharp wave, indicating a generalized epilepsy disorder. She was started on Lamotrigine but 5 weeks later had a severe allergic reaction with swollen lymph nodes, night sweats, then rash. She was switched to Keppra and had a more severe immediate reaction within 1-2 days with rash and facial swelling. She was then switched  to Trileptal which she tolerated well, and reports that all the symptoms she was having went away. She last saw Dr. Terrace Arabia in February to discuss Trileptal and contraceptives, and made the decision to switch to Vimpat. She presents today asking to be switched back to Trileptal, stating that the Vimpat was not controlling her symptoms.  She would have episodes of turning [place], nausea, vision change, staring off into space unresponsive. She was also having headaches with vomiting. Her psychiatrist was concerned that Remeron was also contributing to symptoms, but she had been taking Remeron with Trileptal without these symptoms.   She also reports episodes where she would have a sudden speech impediment lasting 30 seconds, where she knows what she wants to say but cannot get the words out or form the words. Twice in the past 2 years she has felt a sensation of fear prior to the speech difficulties. She denies any olfactory/gustatory hallucinations, the left-sided symptoms have not recurred since 2014, no myoclonic jerks. She has had headaches every other day when she is late to take her Vimpat dose, resolving after she takes the medication. Pain is around her eyes with some photosensitivity, no nausea/vomiting. She denies any diplopia, dizziness, neck/back pain, bowel/bladder dysfunction.  Epilepsy Risk Factors:  Bacterial meningitis in infancy with encephalomalacia in the bilateral occipital lobes. Her maternal cousin had seizures in childhood, a maternal uncle had 2 seizures felt to be related to glucose levels. Otherwise she had a normal birth and early development.  There is no history of febrile convulsions, significant traumatic brain injury, neurosurgical procedures.  Prior AEDs: Keppra, Lamotrigine, Trileptal  PAST MEDICAL HISTORY: Past Medical History:  Diagnosis Date  . Anxiety disorder   . Heat intolerance   . Meningitis     MEDICATIONS: Current Outpatient Prescriptions on File Prior to Visit  Medication Sig Dispense Refill  . mirtazapine (REMERON) 15 MG tablet Take 15 mg by mouth at bedtime.    . norethindrone (MICRONOR,CAMILA,ERRIN) 0.35 MG tablet Take 1 tablet by mouth daily.    . Oxcarbazepine (TRILEPTAL) 300 MG tablet Take 1 tablet (300 mg total) by mouth 2 (two) times daily. 60 tablet 11   No current  facility-administered medications on file prior to visit.     ALLERGIES: Allergies  Allergen Reactions  . Keppra [Levetiracetam] Swelling and Rash  . Lamictal [Lamotrigine] Rash    FAMILY HISTORY: Family History  Problem Relation Age of Onset  . Hypertension Maternal Grandfather   . Heart disease Maternal Grandfather     SOCIAL HISTORY: Social History   Social History  . Marital status: Married    Spouse name: N/A  . Number of children: 0  . Years of education: 14   Occupational History  . Student     [employer]   Social History Main Topics  . Smoking status: Never Smoker  . Smokeless tobacco: Never Used  . Alcohol use No  . Drug use: No  . Sexual activity: Not on file   Other Topics Concern  . Not on file   Social History Narrative   Lives with two roommates.     Right-handed.   Two 12oz cans of soda daily.       REVIEW OF SYSTEMS: Constitutional: No fevers, chills, or sweats, no generalized fatigue, change in appetite Eyes: No visual changes, double vision, eye pain Ear, nose and throat: No hearing loss, ear pain, nasal congestion, sore throat Cardiovascular: No chest pain, palpitations Respiratory:  No shortness of breath at rest or  with exertion, wheezes GastrointestinaI: No nausea, vomiting, diarrhea, abdominal pain, fecal incontinence Genitourinary:  No dysuria, urinary retention or frequency Musculoskeletal:  No neck pain, back pain Integumentary: No rash, pruritus, skin lesions Neurological: as above Psychiatric: No depression, insomnia, anxiety Endocrine: No palpitations, fatigue, diaphoresis, mood swings, change in appetite, change in weight, increased thirst Hematologic/Lymphatic:  No anemia, purpura, petechiae. Allergic/Immunologic: no itchy/runny eyes, nasal congestion, recent allergic reactions, rashes  PHYSICAL EXAM: Vitals:   04/21/17 1005  BP: 118/70  Pulse: 75   General: No acute distress Head:  Normocephalic/atraumatic Neck: supple,  no paraspinal tenderness, full range of motion Back: No paraspinal tenderness Heart: regular rate and rhythm Lungs: Clear to auscultation bilaterally. Vascular: No carotid bruits. Skin/Extremities: No rash, no edema Neurological Exam: Mental status: alert and oriented to person, place, and time, no dysarthria or aphasia, Fund of knowledge is appropriate.  Recent and remote memory are intact. Attention and concentration are normal.    Able to name objects and repeat phrases. Cranial nerves: CN I: not tested CN II: pupils equal, round and reactive to light, peripheral field deficits bilaterally, she sees movement in the superior quadrants, unable to see in the inferior quadrants (similar to prior) CN III, IV, VI:  Limited left eye abduction with left esotropia, no nystagmus, no ptosis CN V: facial sensation intact CN VII: upper and lower face symmetric CN VIII: hearing intact to finger rub CN IX, X: gag intact, uvula midline CN XI: sternocleidomastoid and trapezius muscles intact CN XII: tongue midline Bulk & Tone: normal, no fasciculations. Motor: 5/5 throughout with no pronator drift. Sensation: intact to light touch.  No extinction to double simultaneous stimulation.  Romberg test negative Deep Tendon Reflexes: +2 throughout, no ankle clonus Plantar responses: downgoing bilaterally Cerebellar: no incoordination on finger to nose testing Gait: narrow-based and steady, able to tandem walk adequately. Tremor: none  IMPRESSION: This is a pleasant 31 yo RH woman with a history of bacterial meningitis in infancy with bilateral occipital encephalomalacia, seizures in childhood with staring spells that initially stopped at age 89 but recurred in 2014. She also started having recurrent episodes of left-sided weakness in 2014, denies any further similar symptoms since then. She reports recurrent episodes where she becomes pale, nauseated, and has had staring spells that stopped after Trileptal,  but recurred with switch to Vimpat. EEG showed generalized discharged during hyperventilation suggestive of a generalized epilepsy. She has tapered off Vimpat and is back on Trileptal 300mg  BID with no seizures or side effects. She is wondering about switching medications in preparation for pregnancy. We discussed medication options in women with epilepsy and effects on the unborn fetus, she had significant allergic reactions to Lamotrigine and Levetiracetam, which are usually the first choice medications when planning for pregnancy (2% risk of major malformations). We discussed that Trileptal has a reported risk of around 2% but the sample size is lower compared to LTG and LEV. Current studies still show that the risk of malformations are still lower with the newer AEDs such as Trileptal, compared to traditional AEDs (valproate, phenytoin). We also discussed that women who are seizure-free for 9 months or more prior to becoming pregnant are likely to remain seizure-free during pregnancy, so the fact that she has been doing well on Trileptal, I would be hesitant to switch to a different medication at this time. Agree with taking higher dose folic acid 4-5mg  daily. Continue prenatal vitamins. We discussed that more than 95% of women with epilepsy have full-term pregnancies and  a healthy child. Continue follow-up with high-risk OB. She does not drive. She will follow-up in 5 months and knows to call for any changes.   Thank you for allowing me to participate in her care.  Please do not hesitate to call for any questions or concerns.  The duration of this appointment visit was 25 minutes of face-to-face time with the patient.  Greater than 50% of this time was spent in counseling, explanation of diagnosis, planning of further management, and coordination of care.   Patrcia Dolly, M.D.   CC: Dr. Louanna Raw, Dr. Senaida Ores

## 2017-04-21 NOTE — Patient Instructions (Signed)
1. Continue Trileptal 300mg  twice a day 2. Start taking folic acid 5mg  daily 3. Follow-up in 5 months, call for any changes  Seizure Precautions: 1. If medication has been prescribed for you to prevent seizures, take it exactly as directed.  Do not stop taking the medicine without talking to your doctor first, even if you have not had a seizure in a long time.   2. Avoid activities in which a seizure would cause danger to yourself or to others.  Don't operate dangerous machinery, swim alone, or climb in high or dangerous places, such as on ladders, roofs, or girders.  Do not drive unless your doctor says you may.  3. If you have any warning that you may have a seizure, lay down in a safe place where you can't hurt yourself.    4.  No driving for 6 months from last seizure, as per [place].   Please refer to the following link on the [employer]'s website for more information: http://www.epilepsyfoundation.org/answerplace/Social/driving/drivingu.cfm   5.  Maintain good sleep hygiene. Avoid alcohol.  6.  Notify your neurology if you are planning pregnancy or if you become pregnant.  7.  Contact your doctor if you have any problems that may be related to the medicine you are taking.  8.  Call 911 and bring the patient back to the ED if:        A.  The seizure lasts longer than 5 minutes.       B.  The patient doesn't awaken shortly after the seizure  C.  The patient has new problems such as difficulty seeing, speaking or moving  D.  The patient was injured during the seizure  E.  The patient has a temperature over 102 F (39C)  F.  The patient vomited and now is having trouble breathing

## 2017-04-26 ENCOUNTER — Ambulatory Visit: Payer: BLUE CROSS/BLUE SHIELD | Admitting: Adult Health

## 2017-04-26 ENCOUNTER — Encounter: Payer: Self-pay | Admitting: Neurology

## 2017-04-26 NOTE — Progress Notes (Signed)
Note faxed.

## 2017-05-10 ENCOUNTER — Ambulatory Visit: Payer: BLUE CROSS/BLUE SHIELD | Admitting: Neurology

## 2017-07-27 DIAGNOSIS — H5211 Myopia, right eye: Secondary | ICD-10-CM | POA: Diagnosis not present

## 2017-09-14 DIAGNOSIS — G40909 Epilepsy, unspecified, not intractable, without status epilepticus: Secondary | ICD-10-CM | POA: Diagnosis not present

## 2017-09-14 DIAGNOSIS — Z319 Encounter for procreative management, unspecified: Secondary | ICD-10-CM | POA: Diagnosis not present

## 2017-10-04 ENCOUNTER — Encounter: Payer: Self-pay | Admitting: Neurology

## 2017-10-04 ENCOUNTER — Ambulatory Visit: Payer: 59 | Admitting: Neurology

## 2017-10-04 VITALS — BP 102/68 | HR 56 | Resp 10 | Ht 60.75 in | Wt 93.0 lb

## 2017-10-04 DIAGNOSIS — G40309 Generalized idiopathic epilepsy and epileptic syndromes, not intractable, without status epilepticus: Secondary | ICD-10-CM

## 2017-10-04 MED ORDER — OXCARBAZEPINE 300 MG PO TABS: 300.0000 mg | ORAL_TABLET | Freq: Two times a day (BID) | ORAL | 3 refills | Status: DC

## 2017-10-04 NOTE — Progress Notes (Signed)
NEUROLOGY FOLLOW UP OFFICE NOTE  Chelsea LoaCrystal Collins [id number] 03/04/1986  HISTORY OF PRESENT ILLNESS: I had the pleasure of seeing Chelsea Collins in follow-up in the neurology clinic on 10/04/2017. She is again accompanied by her husband who helps supplement the history today. The patient was last seen 5 months ago for primary generalized epilepsy. She continues to do well on Trileptal 300mg  BID with no seizures or seizure-like symptoms. They are currently planning for pregnancy, she is taking high dose folic acid and sees high-risk OB. She denies any staring/unresponsive episodes, gaps in time, speech changes, olfactory/gustatory hallucinations, focal numbness/tingling/weakness, myoclonic jerks. No headaches, dizziness, diplopia, neck/back pain, no falls. She has been switched from mirtazapine to Buspar, with no side effects. She has a new job as a Scientist, physiologicalreceptionist.   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 She was diagnosed with epilepsy in childhood when she was having staring spells. She reports that seizures stopped at age 89 and she was taken off seizure medication at age 32. She denies any myoclonic jerks or convulsions. In 2012, she started having episodes where her left leg would become weak for a minute or so, sometimes it felt like her left arm was also affected. She had [place] brain in [place] reported as showing previous injury to the occipital lobe. She saw a neurologist at that time with concern for MS, she was reassured there was no evidence of MS. In 2014, she was in a classroom and had an episode where she apparently walked toward the wall and stood there just staring and unresponsive. She was amnestic of the event until her supervisor called her to the office and told her what happened. She recalls another episode that same semester, she was stressed and waiting in the lobby, then did  not know how she got there. She had an MRI brain without contrast which did not show any acute changes, there was note of ulceration in the calcarine cortex of both hemispheres, left greater than right, increased FLAIR signal going into the deep while matter with decreased signal centrally. Wake and drowsy EEG was normal. When she left school, she reports episodes were not as bad, she was still having difficulties with her left side and started having troubles with anxiety as well. She would be at a store and would not remember a word or a color. She had an incident in December 2016 while working at [employer] in [place], she was setting up then started feeling sick and nauseated, walked to the back of the store and felt like she could not stand up, vision was blurred. She had to sit on the floor and was pale. When she moved to [place], she started having episodes of feeling hot, nauseated, weird, pale, as well as zoning out ("walking into traffic"). She saw neurologist Dr. Terrace ArabiaYan and had a repeat EEG which was abnormal, during hyperventilation which was performed twice, there were intermittent short bursts of generalized spike sharp wave, indicating a generalized epilepsy disorder. She was started on Lamotrigine but 5 weeks later had a severe allergic reaction with swollen lymph nodes, night sweats, then rash. She was switched to Keppra and had a more severe immediate reaction within 1-2 days with rash and facial swelling. She was then switched to Trileptal which she tolerated well, and reports that all the symptoms she was having went away. She last saw Dr. Terrace ArabiaYan  in February to discuss Trileptal and contraceptives, and made the decision to switch to Vimpat. She presents today asking to be switched back to Trileptal, stating that the Vimpat was not controlling her symptoms. She would have episodes of turning Babich, nausea, vision change, staring off into space unresponsive. She was also having headaches with vomiting.  Her psychiatrist was concerned that Remeron was also contributing to symptoms, but she had been taking Remeron with Trileptal without these symptoms.   She also reports episodes where she would have a sudden speech impediment lasting 30 seconds, where she knows what she wants to say but cannot get the words out or form the words. Twice in the past 2 years she has felt a sensation of fear prior to the speech difficulties. She denies any olfactory/gustatory hallucinations, the left-sided symptoms have not recurred since 2014, no myoclonic jerks. She has had headaches every other day when she is late to take her Vimpat dose, resolving after she takes the medication. Pain is around her eyes with some photosensitivity, no nausea/vomiting. She denies any diplopia, dizziness, neck/back pain, bowel/bladder dysfunction.  Epilepsy Risk Factors:  Bacterial meningitis in infancy with encephalomalacia in the bilateral occipital lobes. Her maternal cousin had seizures in childhood, a maternal uncle had 2 seizures felt to be related to glucose levels. Otherwise she had a normal birth and early development.  There is no history of febrile convulsions, significant traumatic brain injury, neurosurgical procedures.  Prior AEDs: Keppra, Lamotrigine, Trileptal  PAST MEDICAL HISTORY: Past Medical History:  Diagnosis Date  . Anxiety disorder   . Heat intolerance   . Meningitis     MEDICATIONS: Current Outpatient Medications on File Prior to Visit  Medication Sig Dispense Refill  . BUSPIRONE HCL PO buspirone    . folic acid (FOLVITE) 1 MG tablet folic acid 1 mg tablet  Take 2 tablets twice a day by oral route.    . mirtazapine (REMERON) 15 MG tablet Take 15 mg by mouth at bedtime.    . Oxcarbazepine (TRILEPTAL) 300 MG tablet Take 1 tablet (300 mg total) by mouth 2 (two) times daily. 180 tablet 3  . Prenatal MV-Min-Fe Fum-FA-DHA (PRENATAL 1 PO) Take by mouth.     No current facility-administered medications on  file prior to visit.     ALLERGIES: Allergies  Allergen Reactions  . Keppra [Levetiracetam] Swelling and Rash  . Lamictal [Lamotrigine] Rash    FAMILY HISTORY: Family History  Problem Relation Age of Onset  . Hypertension Maternal Grandfather   . Heart disease Maternal Grandfather     SOCIAL HISTORY: Social History   Socioeconomic History  . Marital status: Married    Spouse name: Not on file  . Number of children: 0  . Years of education: 45  . Highest education level: Not on file  Social Needs  . Financial resource strain: Not on file  . Food insecurity - worry: Not on file  . Food insecurity - inability: Not on file  . Transportation needs - medical: Not on file  . Transportation needs - non-medical: Not on file  Occupational History  . Occupation: Student    Comment: [employer]  Tobacco Use  . Smoking status: Never Smoker  . Smokeless tobacco: Never Used  Substance and Sexual Activity  . Alcohol use: No    Alcohol/week: 0.0 oz  . Drug use: No  . Sexual activity: Not on file  Other Topics Concern  . Not on file  Social History Narrative   Lives  with two roommates.     Right-handed.   Two 12oz cans of soda daily.    REVIEW OF SYSTEMS: Constitutional: No fevers, chills, or sweats, no generalized fatigue, change in appetite Eyes: No visual changes, double vision, eye pain Ear, nose and throat: No hearing loss, ear pain, nasal congestion, sore throat Cardiovascular: No chest pain, palpitations Respiratory:  No shortness of breath at rest or with exertion, wheezes GastrointestinaI: No nausea, vomiting, diarrhea, abdominal pain, fecal incontinence Genitourinary:  No dysuria, urinary retention or frequency Musculoskeletal:  No neck pain, back pain Integumentary: No rash, pruritus, skin lesions Neurological: as above Psychiatric: No depression, insomnia, anxiety Endocrine: No palpitations, fatigue, diaphoresis, mood swings, change in appetite, change in weight,  increased thirst Hematologic/Lymphatic:  No anemia, purpura, petechiae. Allergic/Immunologic: no itchy/runny eyes, nasal congestion, recent allergic reactions, rashes  PHYSICAL EXAM: Vitals:   10/04/17 1544  BP: 102/68  Pulse: (!) 56  Resp: 10   General: No acute distress Head:  Normocephalic/atraumatic Neck: supple, no paraspinal tenderness, full range of motion Back: No paraspinal tenderness Heart: regular rate and rhythm Lungs: Clear to auscultation bilaterally. Vascular: No carotid bruits. Skin/Extremities: No rash, no edema Neurological Exam: Mental status: alert and oriented to person, place, and time, no dysarthria or aphasia, Fund of knowledge is appropriate.  Recent and remote memory are intact. Attention and concentration are normal.    Able to name objects and repeat phrases. Cranial nerves: CN I: not tested CN II: pupils equal, round and reactive to light, peripheral field deficits bilaterally, she sees movement in the superior quadrants, unable to see in the inferior quadrants (similar to prior) CN III, IV, VI:  Limited left eye abduction with left esotropia, no nystagmus, no ptosis CN V: facial sensation intact CN VII: upper and lower face symmetric CN VIII: hearing intact to finger rub CN IX, X: gag intact, uvula midline CN XI: sternocleidomastoid and trapezius muscles intact CN XII: tongue midline Bulk & Tone: normal, no fasciculations. Motor: 5/5 throughout with no pronator drift. Sensation: intact to light touch.  No extinction to double simultaneous stimulation.  Romberg test negative Deep Tendon Reflexes: +2 throughout, no ankle clonus Plantar responses: downgoing bilaterally Cerebellar: no incoordination on finger to nose testing Gait: narrow-based and steady, able to tandem walk adequately. Tremor: none  IMPRESSION: This is a pleasant 32 yo RH woman with a history of bacterial meningitis in infancy with bilateral occipital encephalomalacia, seizures in  childhood with staring spells that initially stopped at age 38 but recurred in 2014. She also started having recurrent episodes of left-sided weakness in 2014, denies any further similar symptoms since then. She reports recurrent episodes where she becomes pale, nauseated, and has had staring spells that stopped after Trileptal, but recurred with switch to Vimpat. EEG showed generalized discharges during hyperventilation suggestive of a generalized epilepsy. She has tapered off Vimpat and is back on Trileptal 300mg  BID with no seizures or side effects. We had discussed issues in women with epilepsy and malformations risks with Trileptal on her last visit. They are planning for pregnancy, she is on high dose folic acid. No seizures. Continue current dose Trileptal 300mg  BID. A baseline Trileptal level will be checked. We will be periodically checking her Trileptal level during pregnancy to maintain pre-pregnancy level. She does not drive. She will follow-up in 6 months and knows to call for any changes.   Thank you for allowing me to participate in her care.  Please do not hesitate to call for any questions  or concerns.  The duration of this appointment visit was 15 minutes of face-to-face time with the patient.  Greater than 50% of this time was spent in counseling, explanation of diagnosis, planning of further management, and coordination of care.   Patrcia Dolly, M.D.   CC: Dr. Louanna Raw, Dr. Senaida Ores

## 2017-10-04 NOTE — Patient Instructions (Addendum)
Great seeing both of you! Continue Trileptal 300mg  twice a day. We will check a baseline oxcarbazepine level.Continue folic acid. Follow-up in 6 months, call for any changes.  We have asked to have your trileptal levels checked.  The best time to do this is in the morning, prior to your first does.  Our lab is located within [facility], [id number] in this building.  Since you will be coming on a day that you do not have an appointment with us, we do ask that you check in at our front office ([id number]) prior to going to the lab.    Seizure Precautions: 1. If medication has been prescribed for you to prevent seizures, take it exactly as directed.  Do not stop taking the medicine without talking to your doctor first, even if you have not had a seizure in a long time.   2. Avoid activities in which a seizure would cause danger to yourself or to others.  Don't operate dangerous machinery, swim alone, or climb in high or dangerous places, such as on ladders, roofs, or girders.  Do not drive unless your doctor says you may.  3. If you have any warning that you may have a seizure, lay down in a safe place where you can't hurt yourself.    4.  No driving for 6 months from last seizure, as per [place] [facility] [place] law.   Please refer to the following link on the Epilepsy Foundation of America's website for more information: http://www.epilepsyfoundation.org/answerplace/Social/driving/drivingu.cfm   5.  Maintain good sleep hygiene. Avoid alcohol.  6.  Notify your neurology if you are planning pregnancy or if you become pregnant.  7.  Contact your doctor if you have any problems that may be related to the medicine you are taking.  8.  Call 911 and bring the patient back to the ED if:        A.  The seizure lasts longer than 5 minutes.       B.  The patient doesn't awaken shortly after the seizure  C.  The patient has new problems such as difficulty seeing, speaking or moving  D.  The patient  was injured during the seizure  E.  The patient has a temperature over 102 F (39C)  F.  The patient vomited and now is having trouble breathing

## 2017-10-10 MED FILL — FOLIC ACID 1 MG TABLET: 1 | 30 days supply | Qty: 120 | Fill #0

## 2017-10-10 MED FILL — OXcarbazepine 300 MG TABS: 300 | 90 days supply | Qty: 180 | Fill #0

## 2017-10-11 MED FILL — busPIRone HCL 10 MG TABS: 10 | 30 days supply | Qty: 60 | Fill #0

## 2017-10-31 MED FILL — busPIRone HCL 10 MG TABS: 10 | 30 days supply | Qty: 120 | Fill #0

## 2017-11-16 MED FILL — FOLIC ACID 1 MG TABLET: 1 | 30 days supply | Qty: 120 | Fill #1

## 2017-11-28 ENCOUNTER — Telehealth: Payer: Self-pay | Admitting: Neurology

## 2017-11-28 NOTE — Telephone Encounter (Signed)
Patient called regarding needing to have a Referral sent to a new Psychiatrist or a Designer[facility] fashion[facility]clothingamily Practice. She said the Doctor she is seeing is having to reduce the number of patients that she is seeing. Please Call. Thanks

## 2017-12-01 MED FILL — busPIRone HCL 10 MG TABS: 10 | 30 days supply | Qty: 120 | Fill #1

## 2017-12-01 NOTE — Telephone Encounter (Signed)
Was she seeing a psychiatrist? Does she need both? If just PCP, pls give her [facility] info to make an appt. Thanks

## 2017-12-16 MED FILL — FOLIC ACID 1 MG TABLET: 1 | 30 days supply | Qty: 120 | Fill #2

## 2017-12-19 LAB — HM PAP SMEAR

## 2018-01-02 MED FILL — busPIRone HCL 10 MG TABS: 10 | 30 days supply | Qty: 120 | Fill #2

## 2018-01-03 DIAGNOSIS — Z681 Body mass index (BMI) 19 or less, adult: Secondary | ICD-10-CM | POA: Diagnosis not present

## 2018-01-03 DIAGNOSIS — Z01419 Encounter for gynecological examination (general) (routine) without abnormal findings: Secondary | ICD-10-CM | POA: Diagnosis not present

## 2018-01-03 DIAGNOSIS — Z130 Encounter for screening for diseases of the blood and blood-forming organs and certain disorders involving the immune mechanism: Secondary | ICD-10-CM | POA: Diagnosis not present

## 2018-01-03 DIAGNOSIS — Z1389 Encounter for screening for other disorder: Secondary | ICD-10-CM | POA: Diagnosis not present

## 2018-01-03 DIAGNOSIS — Z124 Encounter for screening for malignant neoplasm of cervix: Secondary | ICD-10-CM | POA: Diagnosis not present

## 2018-01-12 MED FILL — OXcarbazepine 300 MG TABS: 300 | 90 days supply | Qty: 180 | Fill #1

## 2018-01-12 MED FILL — FOLIC ACID 1 MG TABLET: 1 | 30 days supply | Qty: 120 | Fill #3

## 2018-01-16 ENCOUNTER — Telehealth: Payer: Self-pay | Admitting: Neurology

## 2018-01-16 NOTE — Telephone Encounter (Signed)
Pt left a VM message saying she had a positive urine test and wanted to know if she still needed a blood test also to start treatment

## 2018-01-16 NOTE — Telephone Encounter (Signed)
Spoke with pt.  She states that she has a positive pregnancy urine test and thought we required a positive blood test.  I advised that we do not, however we would like to check her Trileptal levels.  Pt states she will be in the lab tomorrow morning for the draw.  Pt advised not to take AM Trileptal dose.

## 2018-01-17 ENCOUNTER — Other Ambulatory Visit: Payer: 59

## 2018-01-17 DIAGNOSIS — G40309 Generalized idiopathic epilepsy and epileptic syndromes, not intractable, without status epilepticus: Secondary | ICD-10-CM | POA: Diagnosis not present

## 2018-01-20 LAB — 10-HYDROXYCARBAZEPINE: Triliptal/MTB(Oxcarbazepin): 9.6 ug/mL (ref 8.0–35.0)

## 2018-01-31 DIAGNOSIS — Z3201 Encounter for pregnancy test, result positive: Secondary | ICD-10-CM | POA: Diagnosis not present

## 2018-01-31 DIAGNOSIS — N912 Amenorrhea, unspecified: Secondary | ICD-10-CM | POA: Diagnosis not present

## 2018-02-06 MED FILL — DICLEGIS DR 10-10 MG TABLET: 10-10 | 30 days supply | Qty: 120 | Fill #0

## 2018-02-09 MED FILL — busPIRone HCL 10 MG TABS: 10 | 30 days supply | Qty: 120 | Fill #0

## 2018-02-20 MED FILL — FOLIC ACID 1 MG TABS: 1 | 30 days supply | Qty: 120 | Fill #4

## 2018-02-27 DIAGNOSIS — Z3A00 Weeks of gestation of pregnancy not specified: Secondary | ICD-10-CM | POA: Diagnosis not present

## 2018-02-27 DIAGNOSIS — Z113 Encounter for screening for infections with a predominantly sexual mode of transmission: Secondary | ICD-10-CM | POA: Diagnosis not present

## 2018-02-27 DIAGNOSIS — O99351 Diseases of the nervous system complicating pregnancy, first trimester: Secondary | ICD-10-CM | POA: Diagnosis not present

## 2018-02-27 DIAGNOSIS — O99352 Diseases of the nervous system complicating pregnancy, second trimester: Secondary | ICD-10-CM | POA: Diagnosis not present

## 2018-02-27 DIAGNOSIS — Z363 Encounter for antenatal screening for malformations: Secondary | ICD-10-CM | POA: Diagnosis not present

## 2018-02-27 DIAGNOSIS — O99340 Other mental disorders complicating pregnancy, unspecified trimester: Secondary | ICD-10-CM | POA: Diagnosis not present

## 2018-02-27 DIAGNOSIS — O99341 Other mental disorders complicating pregnancy, first trimester: Secondary | ICD-10-CM | POA: Diagnosis not present

## 2018-02-27 DIAGNOSIS — O26891 Other specified pregnancy related conditions, first trimester: Secondary | ICD-10-CM | POA: Diagnosis not present

## 2018-02-27 DIAGNOSIS — Z3A19 19 weeks gestation of pregnancy: Secondary | ICD-10-CM | POA: Diagnosis not present

## 2018-02-27 DIAGNOSIS — Z3689 Encounter for other specified antenatal screening: Secondary | ICD-10-CM | POA: Diagnosis not present

## 2018-02-27 DIAGNOSIS — Z3A10 10 weeks gestation of pregnancy: Secondary | ICD-10-CM | POA: Diagnosis not present

## 2018-02-27 DIAGNOSIS — O99350 Diseases of the nervous system complicating pregnancy, unspecified trimester: Secondary | ICD-10-CM | POA: Diagnosis not present

## 2018-02-27 LAB — OB RESULTS CONSOLE ANTIBODY SCREEN: ANTIBODY SCREEN: NEGATIVE

## 2018-02-27 LAB — OB RESULTS CONSOLE GC/CHLAMYDIA
Chlamydia: NEGATIVE
GC PROBE AMP, GENITAL: NEGATIVE

## 2018-02-27 LAB — OB RESULTS CONSOLE HIV ANTIBODY (ROUTINE TESTING): HIV: NONREACTIVE

## 2018-02-27 LAB — OB RESULTS CONSOLE ABO/RH: RH TYPE: POSITIVE

## 2018-02-27 LAB — OB RESULTS CONSOLE RUBELLA ANTIBODY, IGM: Rubella: NON-IMMUNE/NOT IMMUNE

## 2018-02-27 LAB — OB RESULTS CONSOLE HEPATITIS B SURFACE ANTIGEN: Hepatitis B Surface Ag: NEGATIVE

## 2018-02-27 LAB — OB RESULTS CONSOLE RPR: RPR: NONREACTIVE

## 2018-03-01 ENCOUNTER — Ambulatory Visit (HOSPITAL_COMMUNITY): Payer: 59 | Admitting: Psychiatry

## 2018-03-21 ENCOUNTER — Ambulatory Visit: Payer: 59 | Admitting: Family Medicine

## 2018-03-21 ENCOUNTER — Other Ambulatory Visit: Payer: Self-pay

## 2018-03-21 ENCOUNTER — Encounter: Payer: Self-pay | Admitting: Family Medicine

## 2018-03-21 VITALS — BP 102/68 | HR 98 | Temp 98.6°F | Ht 63.0 in | Wt 98.2 lb

## 2018-03-21 DIAGNOSIS — F419 Anxiety disorder, unspecified: Secondary | ICD-10-CM | POA: Insufficient documentation

## 2018-03-21 DIAGNOSIS — G9389 Other specified disorders of brain: Secondary | ICD-10-CM

## 2018-03-21 DIAGNOSIS — F411 Generalized anxiety disorder: Secondary | ICD-10-CM | POA: Diagnosis not present

## 2018-03-21 DIAGNOSIS — G40209 Localization-related (focal) (partial) symptomatic epilepsy and epileptic syndromes with complex partial seizures, not intractable, without status epilepticus: Secondary | ICD-10-CM | POA: Insufficient documentation

## 2018-03-21 DIAGNOSIS — Z331 Pregnant state, incidental: Secondary | ICD-10-CM

## 2018-03-21 HISTORY — DX: Other specified disorders of brain: G93.89

## 2018-03-21 HISTORY — DX: Generalized anxiety disorder: F41.1

## 2018-03-21 MED ORDER — BUSPIRONE HCL 10 MG PO TABS: 20.0000 mg | ORAL_TABLET | Freq: Two times a day (BID) | ORAL | 3 refills | Status: AC

## 2018-03-21 MED FILL — busPIRone HCL 10 MG TABS: 10 | 90 days supply | Qty: 360 | Fill #0

## 2018-03-21 NOTE — Progress Notes (Signed)
Subjective  CC:  Chief Complaint  Patient presents with  . Establish Care    Last seen with PA at [facility], Patient is currently Pregnant, sees Dr. Senaida Ores   . Medication Management    wants to have medications managed here     HPI: Chelsea Collins is a 32 y.o. female who presents to [facility] today to establish care with me as a new patient.   She has the following concerns or needs:  Pleasant 32 year old G1, P0 at approximately [redacted] weeks EGA who is seeing Dr. Senaida Ores for [facility] care presents as a new patient.  I reviewed medical records.  She is currently seeing of our neurology to manage her complex partial seizure disorder.  She has history of bacterial meningitis as an infant and  diagnosed with secondary seizure disorder.  She reports a positive EEG.  She has an abnormal brain MRI with occipital bilateral encephalomalacia.  Seizures are currently stable on antiepileptics.  History of anxiety disorder: Had to take medical leave while in college.  Had been seeing Dr. Kizzie Bane, psychiatry for the last 3 to 4 years.  Has been well controlled on BuSpar.  She denies any other diagnoses or mood disorders.  She states she is doing well.  Married a year ago, excited about expecting their first child together.  She graduated from [employer] with a Banker therapy.  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  Her husband is a Lawyer at [employer].  She is originally from [facility] maintenance: Currently up-to-date.  Recent normal Pap smear reported.  Assessment  1. GAD (generalized anxiety disorder)   2. Pregnant state, incidental   3. Partial symptomatic epilepsy with complex partial seizures, not intractable, without status epilepticus (HCC)      Plan   Anxiety disorder: Well-controlled on BuSpar twice daily.  We discussed current dosing.  Consider decreasing to 50 mg twice daily if she remains well controlled.   Discussed safety of use during pregnancy.  Seizure disorder managed by neurology.  They are managing her antiepileptic dosing during her pregnancy.  She has OB follow-up.  She will be due for an update on Tdap during this pregnancy.  Follow up:  Return in about 6 months (around 09/21/2018) for mood follow up. Orders Placed This Encounter  Procedures  . HM PAP SMEAR   Meds ordered this encounter  Medications  . busPIRone (BUSPAR) 10 MG tablet    Sig: Take 2 tablets (20 mg total) by mouth 2 (two) times daily.    Dispense:  360 tablet    Refill:  3     Depression screen PHQ 2/9 03/21/2018  Decreased Interest 0  Down, Depressed, Hopeless 0  PHQ - 2 Score 0  Altered sleeping 0  Tired, decreased energy 0  Change in appetite 0  Feeling bad or failure about yourself  0  Trouble concentrating 0  Moving slowly or fidgety/restless 0  Suicidal thoughts 0  PHQ-9 Score 0  Difficult doing work/chores Not difficult at all    We updated and reviewed the patient's past history in detail and it is documented below.  Patient Active Problem List   Diagnosis Date Noted  . Complex partial seizure (HCC) 03/21/2018  . Encephalomalacia 03/21/2018    Occipital bilateral   . GAD (generalized anxiety disorder) 03/21/2018    DXd by Dr. Kizzie Bane, psychiatry: started tx with mirtazpine. Now pregnant on buspar   .  Epilepsy (HCC) 01/30/2017  . Abnormal MRI of head 10/27/2015  . History of bacterial meningitis in infancy 10/27/2015   Health Maintenance  Topic Date Due  . INFLUENZA VACCINE  04/20/2018  . PAP SMEAR  12/19/2020  . TETANUS/TDAP  04/20/2025  . HIV Screening  Discontinued   Immunization History  Administered Date(s) Administered  . Tdap 04/21/2015   Current Meds  Medication Sig  . folic acid (FOLVITE) 1 MG tablet folic acid 1 mg tablet  Take 2 tablets twice a day by oral route.  . Oxcarbazepine (TRILEPTAL) 300 MG tablet Take 1 tablet (300 mg total) by mouth 2 (two) times daily.  .  Prenatal MV-Min-Fe Fum-FA-DHA (PRENATAL 1 PO) Take by mouth.  . [DISCONTINUED] BUSPIRONE HCL PO buspirone    Allergies: Patient is allergic to keppra [levetiracetam] and lamictal [lamotrigine]. Past Medical History Patient  has a past medical history of Anxiety disorder, Depression, Encephalomalacia (03/21/2018), GAD (generalized anxiety disorder) (03/21/2018), Heat intolerance, Hypoglycemia, Meningitis, and Seizures (HCC). Past Surgical History Patient  has a past surgical history that includes Eye surgery and Tympanostomy tube placement. Family History: Patient family history includes Depression in her brother and mother; Diabetes in her mother; Heart attack in her maternal grandfather; Heart disease in her maternal grandfather; Hypertension in her maternal grandfather; Intellectual disability in her brother; Mental retardation in her brother; Miscarriages / [place] in her mother. Social History:  Patient  reports that she has never smoked. She has never used smokeless tobacco. She reports that she does not drink alcohol or use drugs.  Review of Systems: Constitutional: negative for fever or malaise Ophthalmic: negative for photophobia, double vision or loss of vision Cardiovascular: negative for chest pain, dyspnea on exertion, or new LE swelling Respiratory: negative for SOB or persistent cough Gastrointestinal: negative for abdominal pain, change in bowel habits or melena Genitourinary: negative for dysuria or gross hematuria Musculoskeletal: negative for new gait disturbance or muscular weakness Integumentary: negative for new or persistent rashes Neurological: negative for TIA or stroke symptoms Psychiatric: negative for SI or delusions Allergic/Immunologic: negative for hives  Patient Care Team    Relationship Specialty Notifications Start End  [name], MD PCP - General Family Medicine  03/21/18   Valora Piccolo, PA-C Physician Assistant Physician Assistant  10/27/15     Huel Cote, MD Consulting Physician Obstetrics and Gynecology  03/21/18   Van Clines, MD Consulting Physician Neurology  03/21/18     Objective  Vitals: BP 102/68   Pulse 98   Temp 98.6 F (37 C)   Ht 5\' 3"  (1.6 m)   Wt 98 lb 3.2 oz (44.5 kg)   LMP 12/19/2017 (Exact Date)   SpO2 98%   BMI 17.40 kg/m  General:  Well developed, well nourished, no acute distress  Psych:  Alert and oriented,normal mood and affect HEENT:  Normocephalic, atraumatic, non-icteric sclera, PERRL,  Cardiovascular:  RRR without gallop, rub or murmur, nondisplaced PMI Respiratory:  Good breath sounds bilaterally, CTAB with normal respiratory effort Gastrointestinal: normal bowel sounds, soft, non-tender, no noted masses. No HSM MSK: no deformities, contusions. Joints are without erythema or swelling Skin:  Warm, no rashes or suspicious lesions noted Neurologic:    Mental status is normal.Normal gait   Commons side effects, risks, benefits, and alternatives for medications and treatment plan prescribed today were discussed, and the patient expressed understanding of the given instructions. Patient is instructed to call or message via MyChart if he/she has any questions or concerns regarding our treatment  plan. No barriers to understanding were identified. We discussed Red Flag symptoms and signs in detail. Patient expressed understanding regarding what to do in case of urgent or emergency type symptoms.   Medication list was reconciled, printed and provided to the patient in AVS. Patient instructions and summary information was reviewed with the patient as documented in the AVS. This note was prepared with assistance of Dragon voice recognition software. Occasional wrong-word or sound-a-like substitutions may have occurred due to the inherent limitations of voice recognition software

## 2018-03-21 NOTE — Patient Instructions (Signed)
Please return in 6 months to recheck anxiety. Best of luck with your pregnancy.  It was a pleasure meeting you today! Thank you for choosing us to meet your healthcare needs! I truly look forward to working with you. If you have any questions or concerns, please send me a message via Mychart or call the office at [phone].

## 2018-04-04 ENCOUNTER — Ambulatory Visit: Payer: 59 | Admitting: Neurology

## 2018-04-04 MED FILL — FOLIC ACID 1 MG TABS: 1 | 30 days supply | Qty: 120 | Fill #5

## 2018-04-06 ENCOUNTER — Other Ambulatory Visit: Payer: Self-pay

## 2018-04-06 DIAGNOSIS — R569 Unspecified convulsions: Secondary | ICD-10-CM

## 2018-04-06 DIAGNOSIS — G40309 Generalized idiopathic epilepsy and epileptic syndromes, not intractable, without status epilepticus: Secondary | ICD-10-CM

## 2018-04-06 MED FILL — DOXYLAMINE-PYRIDOXINE 10-10: 10-10 | 30 days supply | Qty: 120 | Fill #1

## 2018-04-11 ENCOUNTER — Other Ambulatory Visit: Payer: 59

## 2018-04-11 DIAGNOSIS — R569 Unspecified convulsions: Secondary | ICD-10-CM | POA: Diagnosis not present

## 2018-04-11 DIAGNOSIS — G40309 Generalized idiopathic epilepsy and epileptic syndromes, not intractable, without status epilepticus: Secondary | ICD-10-CM | POA: Diagnosis not present

## 2018-04-12 ENCOUNTER — Other Ambulatory Visit: Payer: Self-pay

## 2018-04-12 ENCOUNTER — Encounter: Payer: Self-pay | Admitting: Neurology

## 2018-04-12 ENCOUNTER — Ambulatory Visit: Payer: 59 | Admitting: Neurology

## 2018-04-12 VITALS — BP 100/58 | HR 79 | Wt 103.0 lb

## 2018-04-12 DIAGNOSIS — G44209 Tension-type headache, unspecified, not intractable: Secondary | ICD-10-CM | POA: Diagnosis not present

## 2018-04-12 DIAGNOSIS — G40309 Generalized idiopathic epilepsy and epileptic syndromes, not intractable, without status epilepticus: Secondary | ICD-10-CM

## 2018-04-12 MED ORDER — OXCARBAZEPINE 300 MG PO TABS: 300.0000 mg | ORAL_TABLET | Freq: Two times a day (BID) | ORAL | 3 refills | Status: DC

## 2018-04-12 NOTE — Patient Instructions (Signed)
1. Continue Trileptal 300mg  twice a day. Our office will call once we get blood level results if we need to make any dose changes  2. Continue to monitor headaches, if significantly worsening, go to ER  3. Follow-up after January 2020. Have a safe delivery!  Seizure Precautions: 1. If medication has been prescribed for you to prevent seizures, take it exactly as directed.  Do not stop taking the medicine without talking to your doctor first, even if you have not had a seizure in a long time.   2. Avoid activities in which a seizure would cause danger to yourself or to others.  Don't operate dangerous machinery, swim alone, or climb in high or dangerous places, such as on ladders, roofs, or girders.  Do not drive unless your doctor says you may.  3. If you have any warning that you may have a seizure, lay down in a safe place where you can't hurt yourself.    4.  No driving for 6 months from last seizure, [place].   Please refer to the following link on the [employer]ile[employer] of [employer]'s website for more information: http://www.epilepsyfoundation.org/answerplace/Social/driving/drivingu.cfm   5.  Maintain good sleep hygiene. Avoid alcohol.  6.  Notify your neurology if you are planning pregnancy or if you become pregnant.  7.  Contact your doctor if you have any problems that may be related to the medicine you are taking.  8.  Call 911 and bring the patient back to the ED if:        A.  The seizure lasts longer than 5 minutes.       B.  The patient doesn't awaken shortly after the seizure  C.  The patient has new problems such as difficulty seeing, speaking or moving  D.  The patient was injured during the seizure  E.  The patient has a temperature over 102 F (39C)  F.  The patient vomited and now is having trouble breathing

## 2018-04-12 NOTE — Progress Notes (Signed)
NEUROLOGY FOLLOW UP OFFICE NOTE  Chelsea Collins [id number] 09/20/1986  HISTORY OF PRESENT ILLNESS: I had the pleasure of seeing Chelsea Collins in follow-up in the neurology clinic on 04/12/2018. She is again accompanied by her husband who helps supplement the history today. The patient was last seen 6 months ago for primary generalized epilepsy. She is now [redacted] weeks pregnant and denies any seizures on Trileptal 300mg  BID. She and her husband deny any staring/unresponsive episodes, gaps in time, speech changes, olfactory/gustatory hallucinations, focal numbness/tingling/weakness, myoclonic jerks. She has been having more headaches recently, around three days a week, with pressure or throbbing in the frontal regions, L>R. Sometimes the pain goes down her neck but she denies any neck pain. She has been dealing with morning sickness but one time vomited from the headache. Tylenol helps with the headaches. There is no associated dizziness, focal numbness/tingling/weakness, vision changes. She was prescribed Diclegis for the vomiting because she was unable to keep medications down. This is better but she still takes nausea medication. She is taking Buspar for anxiety. She reports some stress at work with scheduling. Sleep is fair, she gets 6 hours of sleep. Last night she was up every hour. No falls. She is due for delivery in January 2020.  Trileptal level on 01/17/18 was 9.6. She had repeat level done yesterday, results unavailable at this time.  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 She was diagnosed with epilepsy in childhood when she was having staring spells. She reports that seizures stopped at age 32 and she was taken off seizure medication at age 32. She denies any myoclonic jerks or convulsions. In 2012, she started having episodes where her left leg would become  weak for a minute or so, sometimes it felt like her left arm was also affected. She had an MRI brain in [place] reported as showing previous injury to the occipital lobe. She saw a neurologist at that time with concern for MS, she was reassured there was no evidence of MS. In 2014, she was in a classroom and had an episode where she apparently walked toward the wall and stood there just staring and unresponsive. She was amnestic of the event until her supervisor called her to the office and told her what happened. She recalls another episode that same semester, she was stressed and waiting in the lobby, then did not know how she got there. She had an MRI brain without contrast which did not show any acute changes, there was note of ulceration in the calcarine cortex of both hemispheres, left greater than right, increased FLAIR signal going into the deep while matter with decreased signal centrally. Wake and drowsy EEG was normal. When she left school, she reports episodes were not as bad, she was still having difficulties with her left side and started having troubles with anxiety as well. She would be at a store and would not remember a word or a color. 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 She had to sit on the floor and was pale. When she moved to [place], she started having episodes of feeling hot, nauseated, weird, pale, as well as zoning out ("walking into  traffic"). She saw neurologist Dr. Terrace Arabia and had a repeat EEG which was abnormal, during hyperventilation which was performed twice, there were intermittent short bursts of generalized spike sharp wave, indicating a generalized epilepsy disorder. She was started on Lamotrigine but 5 weeks later had a severe allergic reaction with swollen lymph nodes, night sweats, then rash.  She was switched to Keppra and had a more severe immediate reaction within 1-2 days with rash and facial swelling. She was then switched to Trileptal which she tolerated well, and reports that all the symptoms she was having went away. She last saw Dr. Terrace Arabia in February to discuss Trileptal and contraceptives, and made the decision to switch to Vimpat. She presents today asking to be switched back to Trileptal, stating that the Vimpat was not controlling her symptoms. She would have episodes of turning Doss, nausea, vision change, staring off into space unresponsive. She was also having headaches with vomiting. Her psychiatrist was concerned that Remeron was also contributing to symptoms, but she had been taking Remeron with Trileptal without these symptoms.   She also reports episodes where she would have a sudden speech impediment lasting 30 seconds, where she knows what she wants to say but cannot get the words out or form the words. Twice in the past 2 years she has felt a sensation of fear prior to the speech difficulties. She denies any olfactory/gustatory hallucinations, the left-sided symptoms have not recurred since 2014, no myoclonic jerks. She has had headaches every other day when she is late to take her Vimpat dose, resolving after she takes the medication. Pain is around her eyes with some photosensitivity, no nausea/vomiting. She denies any diplopia, dizziness, neck/back pain, bowel/bladder dysfunction.  Epilepsy Risk Factors:  Bacterial meningitis in infancy with encephalomalacia in the bilateral occipital lobes. Her maternal cousin had seizures in childhood, a maternal uncle had 2 seizures felt to be related to glucose levels. Otherwise she had a normal birth and early development.  There is no history of febrile convulsions, significant traumatic brain injury, neurosurgical procedures.  Prior AEDs: Keppra, Lamotrigine, Trileptal  PAST MEDICAL HISTORY: Past Medical History:  Diagnosis  Date  . Anxiety disorder   . Depression   . Encephalomalacia 03/21/2018   Occipital bilateral  . GAD (generalized anxiety disorder) 03/21/2018  . Heat intolerance   . Hypoglycemia   . Meningitis   . Seizures (HCC)    as an infant    MEDICATIONS: Current Outpatient Medications on File Prior to Visit  Medication Sig Dispense Refill  . busPIRone (BUSPAR) 10 MG tablet Take 2 tablets (20 mg total) by mouth 2 (two) times daily. 360 tablet 3  . Doxylamine-Pyridoxine 10-10 MG TBEC   3  . folic acid (FOLVITE) 1 MG tablet folic acid 1 mg tablet  Take 2 tablets twice a day by oral route.    . Oxcarbazepine (TRILEPTAL) 300 MG tablet Take 1 tablet (300 mg total) by mouth 2 (two) times daily. 180 tablet 3  . Prenatal MV-Min-Fe Fum-FA-DHA (PRENATAL 1 PO) Take by mouth.     No current facility-administered medications on file prior to visit.     ALLERGIES: Allergies  Allergen Reactions  . Keppra [Levetiracetam] Swelling and Rash  . Lamictal [Lamotrigine] Rash    FAMILY HISTORY: Family History  Problem Relation Age of Onset  . Depression Mother   . Diabetes Mother   . Miscarriages / [place] Mother   . Hypertension Maternal Grandfather   . Heart disease Maternal Grandfather   .  Heart attack Maternal Grandfather   . Depression Brother   . Intellectual disability Brother   . Mental retardation Brother     SOCIAL HISTORY: Social History   Socioeconomic History  . Marital status: Married    Spouse name: Not on file  . Number of children: 0  . Years of education: 4314  . Highest education level: Not on file  Occupational History    Comment: [employer]  Social Needs  . Financial resource strain: Not on file  . Food insecurity:    Worry: Not on file    Inability: Not on file  . Transportation needs:    Medical: Not on file    Non-medical: Not on file  Tobacco Use  . Smoking status: Never Smoker  . Smokeless tobacco: Never Used  Substance and Sexual Activity  . Alcohol use: No     Alcohol/week: 0.0 oz  . Drug use: No  . Sexual activity: Yes    Comment: currently pregnant   Lifestyle  . Physical activity:    Days per week: Not on file    Minutes per session: Not on file  . Stress: Not on file  Relationships  . Social connections:    Talks on phone: Not on file    Gets together: Not on file    Attends religious service: Not on file    Active member of club or organization: Not on file    Attends meetings of clubs or organizations: Not on file    Relationship status: Not on file  . Intimate partner violence:    Fear of current or ex partner: Not on file    Emotionally abused: Not on file    Physically abused: Not on file    Forced sexual activity: Not on file  Other Topics Concern  . Not on file  Social History Narrative   Lives with two roommates.     Right-handed.   Two 12oz cans of soda daily.    REVIEW OF SYSTEMS: Constitutional: No fevers, chills, or sweats, no generalized fatigue, change in appetite Eyes: No visual changes, double vision, eye pain Ear, nose and throat: No hearing loss, ear pain, nasal congestion, sore throat Cardiovascular: No chest pain, palpitations Respiratory:  No shortness of breath at rest or with exertion, wheezes GastrointestinaI: No nausea, vomiting, diarrhea, abdominal pain, fecal incontinence Genitourinary:  No dysuria, urinary retention or frequency Musculoskeletal:  No neck pain, back pain Integumentary: No rash, pruritus, skin lesions Neurological: as above Psychiatric: No depression, insomnia, anxiety Endocrine: No palpitations, fatigue, diaphoresis, mood swings, change in appetite, change in weight, increased thirst Hematologic/Lymphatic:  No anemia, purpura, petechiae. Allergic/Immunologic: no itchy/runny eyes, nasal congestion, recent allergic reactions, rashes  PHYSICAL EXAM: Vitals:   04/12/18 1308  BP: (!) 100/58  Pulse: 79  SpO2: 95%   General: No acute distress Head:   Normocephalic/atraumatic Neck: supple, no paraspinal tenderness, full range of motion Back: No paraspinal tenderness Heart: regular rate and rhythm Lungs: Clear to auscultation bilaterally. Vascular: No carotid bruits. Skin/Extremities: No rash, no edema Neurological Exam: Mental status: alert and oriented to person, place, and time, no dysarthria or aphasia, Fund of knowledge is appropriate.  Recent and remote memory are intact. Attention and concentration are normal.    Able to name objects and repeat phrases. Cranial nerves: CN I: not tested CN II: pupils equal, round and reactive to light, peripheral field deficits bilaterally, she sees movement in the superior quadrants, unable to see in the inferior quadrants (similar  to prior). Unable to visualize fundi bilaterally. CN III, IV, VI:  Limited left eye abduction with left esotropia, no nystagmus, no ptosis CN V: facial sensation intact CN VII: upper and lower face symmetric CN VIII: hearing intact to finger rub CN IX, X: gag intact, uvula midline CN XI: sternocleidomastoid and trapezius muscles intact CN XII: tongue midline Bulk & Tone: normal, no fasciculations. Motor: 5/5 throughout with no pronator drift. Sensation: intact to light touch.  No extinction to double simultaneous stimulation.  Romberg test negative Deep Tendon Reflexes: +2 throughout, no ankle clonus Plantar responses: downgoing bilaterally Cerebellar: no incoordination on finger to nose testing Gait: narrow-based and steady, able to tandem walk adequately. Tremor: none  IMPRESSION: This is a pleasant 32 yo RH woman with a history of bacterial meningitis in infancy with bilateral occipital encephalomalacia, seizures in childhood with staring spells that initially stopped at age 56 but recurred in 2014. She also started having recurrent episodes of left-sided weakness in 2014, denies any further similar symptoms since then. She reports recurrent episodes where she  becomes pale, nauseated, and has had staring spells that stopped after Trileptal, but recurred with switch to Vimpat. EEG showed generalized discharges during hyperventilation suggestive of a generalized epilepsy. She has tapered off Vimpat and is back on Trileptal 300mg  BID with no seizures or side effects since May 2018. She is now [redacted] weeks pregnant, monitor Trileptal levels during pregnancy. She is reporting an increase in headaches, neurological exam non-focal, we discussed different causes of headaches, continue to monitor, but if significantly worsening, go to ER. She does not drive. She will follow-up in 8 months and knows to call for any changes.   Thank you for allowing me to participate in her care.  Please do not hesitate to call for any questions or concerns.  The duration of this appointment visit was 27 minutes of face-to-face time with the patient.  Greater than 50% of this time was spent in counseling, explanation of diagnosis, planning of further management, and coordination of care.   Patrcia Dolly, M.D.   CC: Dr. Mardelle Matte

## 2018-04-13 LAB — 10-HYDROXYCARBAZEPINE: TRILIPTAL/MTB(OXCARBAZEPIN): 5.8 ug/mL — AB (ref 8.0–35.0)

## 2018-04-14 ENCOUNTER — Telehealth: Payer: Self-pay | Admitting: Neurology

## 2018-04-14 DIAGNOSIS — G40309 Generalized idiopathic epilepsy and epileptic syndromes, not intractable, without status epilepticus: Secondary | ICD-10-CM

## 2018-04-14 MED ORDER — OXCARBAZEPINE 300 MG PO TABS: ORAL_TABLET | ORAL | 1 refills | Status: DC

## 2018-04-14 NOTE — Telephone Encounter (Signed)
-----   Message from Van ClinesKaren M Aquino, MD sent at 04/14/2018 10:09 AM EDT ----- Pls let Chelsea Collins know that there has been a drop in her Trileptal level (as expected during pregnancy, prior level was 9.6, currently 5.8), so we do need to increase her Trileptal dose, increase Trileptal 300mg : Take 1 tab in AM, 2 tabs in PM. Pls send in Rx for updated dosing. Recheck level in a month. Thanks

## 2018-04-14 NOTE — Telephone Encounter (Signed)
Left message on machine for patient to call back.

## 2018-04-14 NOTE — Telephone Encounter (Signed)
Increased RX dose sent. Still awaiting call back to make patient aware. Reminder sent to [place], Dr. Rosalyn GessAquino's CMA, to follow up on lab in a month.

## 2018-04-17 NOTE — Telephone Encounter (Signed)
Meagen- patient never called back can you follow up on this?

## 2018-04-18 NOTE — Telephone Encounter (Signed)
Pt made aware of results.  made aware that [facility].  Advised pt to have levels re-checked in a few weeks.  Pt verbalized understanding.

## 2018-04-20 MED FILL — OXcarbazepine 300 MG TABS: 300 | 90 days supply | Qty: 270 | Fill #0

## 2018-05-03 DIAGNOSIS — Z3A19 19 weeks gestation of pregnancy: Secondary | ICD-10-CM | POA: Diagnosis not present

## 2018-05-03 DIAGNOSIS — Z363 Encounter for antenatal screening for malformations: Secondary | ICD-10-CM | POA: Diagnosis not present

## 2018-05-03 DIAGNOSIS — O99352 Diseases of the nervous system complicating pregnancy, second trimester: Secondary | ICD-10-CM | POA: Diagnosis not present

## 2018-05-03 MED FILL — FOLIC ACID 1 MG TABS: 1 | 30 days supply | Qty: 120 | Fill #6

## 2018-05-04 ENCOUNTER — Ambulatory Visit (HOSPITAL_COMMUNITY): Payer: 59 | Admitting: Psychiatry

## 2018-05-31 DIAGNOSIS — Z3A23 23 weeks gestation of pregnancy: Secondary | ICD-10-CM | POA: Diagnosis not present

## 2018-05-31 DIAGNOSIS — O09292 Supervision of pregnancy with other poor reproductive or obstetric history, second trimester: Secondary | ICD-10-CM | POA: Diagnosis not present

## 2018-05-31 DIAGNOSIS — Z362 Encounter for other antenatal screening follow-up: Secondary | ICD-10-CM | POA: Diagnosis not present

## 2018-05-31 DIAGNOSIS — Z23 Encounter for immunization: Secondary | ICD-10-CM | POA: Diagnosis not present

## 2018-06-14 MED FILL — DOXYLAMINE-PYRIDOXINE 10-10: 10-10 | 30 days supply | Qty: 120 | Fill #0

## 2018-06-23 MED FILL — BUTALB-ACETAMIN-CAFF 50-300: 50-300-40 | 3 days supply | Qty: 20 | Fill #0

## 2018-06-23 MED FILL — FOLIC ACID 1 MG TABS: 1 | 30 days supply | Qty: 120 | Fill #7

## 2018-06-28 DIAGNOSIS — O26849 Uterine size-date discrepancy, unspecified trimester: Secondary | ICD-10-CM | POA: Diagnosis not present

## 2018-06-28 DIAGNOSIS — Z23 Encounter for immunization: Secondary | ICD-10-CM | POA: Diagnosis not present

## 2018-06-28 DIAGNOSIS — Z3A27 27 weeks gestation of pregnancy: Secondary | ICD-10-CM | POA: Diagnosis not present

## 2018-06-28 DIAGNOSIS — Z3689 Encounter for other specified antenatal screening: Secondary | ICD-10-CM | POA: Diagnosis not present

## 2018-06-28 DIAGNOSIS — F419 Anxiety disorder, unspecified: Secondary | ICD-10-CM | POA: Diagnosis not present

## 2018-06-28 DIAGNOSIS — G40909 Epilepsy, unspecified, not intractable, without status epilepticus: Secondary | ICD-10-CM | POA: Diagnosis not present

## 2018-07-12 ENCOUNTER — Telehealth: Payer: Self-pay | Admitting: Neurology

## 2018-07-12 DIAGNOSIS — R569 Unspecified convulsions: Secondary | ICD-10-CM

## 2018-07-12 DIAGNOSIS — G40309 Generalized idiopathic epilepsy and epileptic syndromes, not intractable, without status epilepticus: Secondary | ICD-10-CM

## 2018-07-12 NOTE — Telephone Encounter (Signed)
Patient called and is needing to have blood work and has been forgetting to come by the office. She is seeing her obgyn every 2 weeks now and she is wondering could she have her labs there at [facility]? Please Call. She also needed to know if it had to be a certain time of the day? Thanks

## 2018-07-13 DIAGNOSIS — Z3A29 29 weeks gestation of pregnancy: Secondary | ICD-10-CM | POA: Diagnosis not present

## 2018-07-13 DIAGNOSIS — O26843 Uterine size-date discrepancy, third trimester: Secondary | ICD-10-CM | POA: Diagnosis not present

## 2018-07-13 NOTE — Telephone Encounter (Signed)
Lab orders placed in Epic.  Lab slip printed and placed with today's outgoing [employer]PS mail.

## 2018-07-17 MED FILL — busPIRone HCL 10 MG TABS: 10 | 90 days supply | Qty: 360 | Fill #1

## 2018-07-27 ENCOUNTER — Telehealth: Payer: Self-pay | Admitting: Neurology

## 2018-07-27 DIAGNOSIS — O99353 Diseases of the nervous system complicating pregnancy, third trimester: Secondary | ICD-10-CM | POA: Diagnosis not present

## 2018-08-01 NOTE — Telephone Encounter (Signed)
Increase to 2 po bid (so a total of 600 mg twice per day) and repeat level in 10 days

## 2018-08-01 NOTE — Telephone Encounter (Signed)
Error

## 2018-08-01 NOTE — Telephone Encounter (Signed)
Patient called in stating that she is pregnant and that the OBGYN told her to call in and have her medications changed. Her levels were at a 7 and they need to be at 10. She said that it was UREGNT per she was pregnant. Please call her back at [phone]. Thanks!

## 2018-08-01 NOTE — Telephone Encounter (Signed)
Patient made aware of increase in medication and need for repeat labs. She will call back and let me know if she wants labs done here or at [facility]. Will await call back.

## 2018-08-01 NOTE — Telephone Encounter (Signed)
Left message on machine for patient to call back.

## 2018-08-01 NOTE — Telephone Encounter (Signed)
Patient also called on Thursday 07/27/18. She was at her OBGYN office and was needing to verify with Meagen the correct medication that labs were being checked for. Spoke with Meagen and relayed information to patient. Thanks

## 2018-08-01 NOTE — Telephone Encounter (Signed)
Received lab results Oxycarbazepine level is 7.  She is currently taking Trileptal 300 mg - 1 in the AM, 2 in the PM.  Please advise.

## 2018-08-09 ENCOUNTER — Telehealth: Payer: Self-pay | Admitting: Neurology

## 2018-08-09 NOTE — Telephone Encounter (Signed)
-----   Message from Silvio PateJade L Shatarra Wehling, [place] sent at 08/02/2018 11:52 AM EST ----- Remember to order labs if patient doesn't call back

## 2018-08-09 NOTE — Telephone Encounter (Signed)
Left message on machine for patient to call back.  Need to see if she wants me to order labs to have drawn here or at her OBGYN, we never heard back from her last week. Awaiting call back.

## 2018-08-09 NOTE — Telephone Encounter (Signed)
Spoke with patient. She states her OB has an order for labs and will draw this tomorrow morning before her appt.

## 2018-08-10 DIAGNOSIS — O99353 Diseases of the nervous system complicating pregnancy, third trimester: Secondary | ICD-10-CM | POA: Diagnosis not present

## 2018-08-10 DIAGNOSIS — Z3A33 33 weeks gestation of pregnancy: Secondary | ICD-10-CM | POA: Diagnosis not present

## 2018-08-10 DIAGNOSIS — O365930 Maternal care for other known or suspected poor fetal growth, third trimester, not applicable or unspecified: Secondary | ICD-10-CM | POA: Diagnosis not present

## 2018-08-14 MED FILL — FOLIC ACID 1 MG TABS: 1 | 30 days supply | Qty: 120 | Fill #8

## 2018-08-15 ENCOUNTER — Telehealth: Payer: Self-pay | Admitting: Neurology

## 2018-08-15 NOTE — Telephone Encounter (Signed)
Patient left a vm with after hours and stated her levels in her blood were up by one point. She went from a 7 to an 8.

## 2018-08-15 NOTE — Telephone Encounter (Signed)
Patient called and was needing to speak with you regarding her Labs? She also wanted to [facility] aware that she had gotten sick two days in a row last week right after taking her medication. Thanks

## 2018-08-15 NOTE — Telephone Encounter (Signed)
Lab results received from OB/GYN via fax this AM.  Given to Dr. Karel JarvisAquino for review.

## 2018-08-16 ENCOUNTER — Telehealth: Payer: Self-pay

## 2018-08-16 DIAGNOSIS — G40309 Generalized idiopathic epilepsy and epileptic syndromes, not intractable, without status epilepticus: Secondary | ICD-10-CM

## 2018-08-16 MED ORDER — OXCARBAZEPINE 300 MG PO TABS: ORAL_TABLET | ORAL | 3 refills | Status: DC

## 2018-08-16 NOTE — Telephone Encounter (Signed)
Spoke with pt relaying message below.  She states that she does not need to me place order - her OB/GYN will continue to check levels and send results to our office.  Verified preferred pharmacy.   Oxcarbazepine 300mg  #180 with 3 refills Sig = take (2) tabs AM and (3) tabs PM for one week, then increase to (3) tabs BID  [facility]

## 2018-08-16 NOTE — Telephone Encounter (Signed)
Recommend increasing Trileptal 300mg : Take 2 tabs in AM, 3 tabs in PM for a week, then 3 tabs BID, then re-check level a week after increasing dose. Thanks

## 2018-08-16 NOTE — Telephone Encounter (Signed)
-----   Message from Van ClinesKaren M Aquino, MD sent at 08/15/2018  4:51 PM EST ----- Regarding: Trileptal Pls ask how she is doing with the pregnancy and if she has had any seizures. Her level was 8, our baseline was 9.6. I would opt to increase the dose further, but it sounds like she is having side effects on higher dose. Is it after each dose or more in the morning? How far along is her pregnancy? Thanks

## 2018-08-16 NOTE — Telephone Encounter (Signed)
Spoke with pt.  pls see phone encounter for 08/16/2018

## 2018-08-16 NOTE — Telephone Encounter (Signed)
Pt called this AM and I was able to relay message below.  Pt wants to know what next step is.  States that OB says that Trileptal needs to be increased to prevent seizure.  Pt states that she is unsure what "side effects" she had on higher dose.  States that OB concerned if Trileptal level below 10.  I advised pt that her personal baseline is 9.6.  Pt expressed concern as she dose not want to experience seizure with possible fall during pregnancy - then went on to state that she fell just last week due to her vision problems but never contacted her OB/GYN because "it wasn't seizure related".  I advised that I would send message to Dr. Karel JarvisAquino in regards to increase and return call with any recommendations.  pls advise.

## 2018-08-16 NOTE — Telephone Encounter (Signed)
Patient left a msg with after hours about her blood levels. Thanks!

## 2018-08-21 DIAGNOSIS — H5211 Myopia, right eye: Secondary | ICD-10-CM | POA: Diagnosis not present

## 2018-08-22 MED FILL — OXcarbazepine 300 MG TABS: 300 | 30 days supply | Qty: 180 | Fill #0

## 2018-08-28 ENCOUNTER — Ambulatory Visit: Payer: 59 | Admitting: Family Medicine

## 2018-08-28 ENCOUNTER — Encounter: Payer: Self-pay | Admitting: Family Medicine

## 2018-08-28 ENCOUNTER — Other Ambulatory Visit: Payer: Self-pay

## 2018-08-28 VITALS — BP 116/78 | HR 78 | Temp 98.0°F | Ht 63.0 in | Wt 124.8 lb

## 2018-08-28 DIAGNOSIS — F411 Generalized anxiety disorder: Secondary | ICD-10-CM

## 2018-08-28 DIAGNOSIS — Z331 Pregnant state, incidental: Secondary | ICD-10-CM | POA: Diagnosis not present

## 2018-08-28 NOTE — Progress Notes (Signed)
Subjective  CC:  Chief Complaint  Patient presents with  . Anxiety    doing well, doing well on medications    HPI: Chelsea Collins is a 32 y.o. female who presents to the office today to address the problems listed above in the chief complaint.  G1P0 in thrid trimester due Jan 6th - doing well. Coping well. Mildly anxious - normal. No new concerns.  Feeling well. Using buspar bid and working well.   EGA [redacted] weeks today; good FM and no pain.  Assessment  1. GAD (generalized anxiety disorder)   2. Pregnant state, incidental      Plan   GAD:  Well controlled. Continue same meds.    Follow up: Return in about 6 months (around 02/27/2019) for complete physical.   No orders of the defined types were placed in this encounter.  No orders of the defined types were placed in this encounter.     I reviewed the patients updated PMH, FH, and SocHx.    Patient Active Problem List   Diagnosis Date Noted  . Complex partial seizure (HCC) 03/21/2018  . Encephalomalacia 03/21/2018  . Anxiety disorder 03/21/2018  . Epilepsy (HCC) 01/30/2017  . Abnormal MRI of head 10/27/2015  . History of bacterial meningitis in infancy 10/27/2015   Current Meds  Medication Sig  . busPIRone (BUSPAR) 10 MG tablet Take 2 tablets (20 mg total) by mouth 2 (two) times daily.  . Doxylamine-Pyridoxine 10-10 MG TBEC   . folic acid (FOLVITE) 1 MG tablet folic acid 1 mg tablet  Take 2 tablets twice a day by oral route.  . Oxcarbazepine (TRILEPTAL) 300 MG tablet Take (2) Tablets in AM and (3) Tables in PM for 1 week, then increase to (3) Tablets twice daily  . Prenatal MV-Min-Fe Fum-FA-DHA (PRENATAL 1 PO) Take by mouth.    Allergies: Patient is allergic to keppra [levetiracetam] and lamictal [lamotrigine]. Family History: Patient family history includes Depression in her brother and mother; Diabetes in her mother; Heart attack in her maternal grandfather; Heart disease in her maternal grandfather;  Hypertension in her maternal grandfather; Intellectual disability in her brother; Mental retardation in her brother; Miscarriages / [place] in her mother. Social History:  Patient  reports that she has never smoked. She has never used smokeless tobacco. She reports that she does not drink alcohol or use drugs.  Review of Systems: Constitutional: Negative for fever malaise or anorexia Cardiovascular: negative for chest pain Respiratory: negative for SOB or persistent cough Gastrointestinal: negative for abdominal pain  Objective  Vitals: BP 116/78   Pulse 78   Temp 98 F (36.7 C)   Ht 5\' 3"  (1.6 m)   Wt 124 lb 12.8 oz (56.6 kg)   LMP 12/19/2017 (Exact Date)   SpO2 98%   BMI 22.11 kg/m  General: no acute distress , A&Ox3 HEENT: PEERL, conjunctiva normal, Oropharynx moist,neck is supple Cardiovascular:  RRR without murmur or gallop.  Respiratory:  Good breath sounds bilaterally, CTAB with normal respiratory effort Skin:  Warm, no rashes Abd: gravic     Commons side effects, risks, benefits, and alternatives for medications and treatment plan prescribed today were discussed, and the patient expressed understanding of the given instructions. Patient is instructed to call or message via MyChart if he/she has any questions or concerns regarding our treatment plan. No barriers to understanding were identified. We discussed Red Flag symptoms and signs in detail. Patient expressed understanding regarding what to do in case of urgent or emergency  type symptoms.   Medication list was reconciled, printed and provided to the patient in AVS. Patient instructions and summary information was reviewed with the patient as documented in the AVS. This note was prepared with assistance of Dragon voice recognition software. Occasional wrong-word or sound-a-like substitutions may have occurred due to the inherent limitations of voice recognition software

## 2018-08-28 NOTE — Patient Instructions (Signed)
Please return in 6-9 for your annual complete physical; please come fasting.  Best of lucky with your delivery and baby!!! :)  If you have any questions or concerns, please don't hesitate to send me a message via MyChart or call the office at [phone]. Thank you for visiting with us today! It's our pleasure caring for you.

## 2018-08-30 MED FILL — DOXYLAMINE-PYRIDOXINE 10-10: 10-10 | 30 days supply | Qty: 120 | Fill #2

## 2018-08-31 DIAGNOSIS — O99353 Diseases of the nervous system complicating pregnancy, third trimester: Secondary | ICD-10-CM | POA: Diagnosis not present

## 2018-08-31 DIAGNOSIS — O99343 Other mental disorders complicating pregnancy, third trimester: Secondary | ICD-10-CM | POA: Diagnosis not present

## 2018-08-31 DIAGNOSIS — Z3A36 36 weeks gestation of pregnancy: Secondary | ICD-10-CM | POA: Diagnosis not present

## 2018-08-31 DIAGNOSIS — Z3685 Encounter for antenatal screening for Streptococcus B: Secondary | ICD-10-CM | POA: Diagnosis not present

## 2018-08-31 LAB — OB RESULTS CONSOLE GBS: GBS: POSITIVE

## 2018-09-07 DIAGNOSIS — Z3A37 37 weeks gestation of pregnancy: Secondary | ICD-10-CM | POA: Diagnosis not present

## 2018-09-07 DIAGNOSIS — O365930 Maternal care for other known or suspected poor fetal growth, third trimester, not applicable or unspecified: Secondary | ICD-10-CM | POA: Diagnosis not present

## 2018-09-11 ENCOUNTER — Telehealth: Payer: Self-pay | Admitting: Neurology

## 2018-09-11 NOTE — Telephone Encounter (Signed)
Pt's, OB-GYN would like to know if the seizure medication needs to be altered after pt delivers baby? Pls call patient.

## 2018-09-14 NOTE — Telephone Encounter (Signed)
Attempted to return call to pt.  No answer.  Voicemail not set up

## 2018-09-18 NOTE — Telephone Encounter (Signed)
Spoke with pt - advised that she should have labwork done to check her Trileptal levels approximately 1 week postpartum.  Will adjust medications accordingly once we receive results.

## 2018-09-19 ENCOUNTER — Other Ambulatory Visit: Payer: Self-pay | Admitting: Advanced Practice Midwife

## 2018-09-20 NOTE — L&D Delivery Note (Signed)
Operative Delivery Note Pt reached complete dilation and began pushing at 130pm making good progress and bringing vertex to +2-+3 station.  The entire time of pushing the baby had deep variable decelerations which initially recovered between contractions, then were more prolonged.  Due to the variable decels the pt and husband were counseled and agreed to an assisted vaginal delivery with vacuum.  The bell vacuum was applied and suction in the green zone.  Over 3 contractions (with vacuum off between contractions) the vertex was brought down and delivered to crowning.  The pt then pushed out the remainder of the head.   At 2:03 PM a healthy female was delivered via Vaginal, Vacuum Investment banker, operational).  Presentation: vertex; Position: Right,, Occiput,, Anterior; Station: +2NICU team present at delivery but left without intervention when baby doing well.    Verbal consent: obtained from family.  Risks and benefits discussed in detail.  Risks include, but are not limited to the risks of anesthesia, bleeding, infection, damage to maternal tissues, fetal cephalhematoma.  There is also the risk of inability to effect vaginal delivery of the head, or shoulder dystocia that cannot be resolved by established maneuvers, leading to the need for emergency cesarean section.  APGAR: 9, 9; weight pending .   Placenta status: delivered spontaneously.   Cord:  with the following complications: tight nuchal delivered through.    Anesthesia: epidural Instruments: Bell vacuum Episiotomy: None Lacerations: partial 3rd degree Suture Repair: 0 and 2-0 vicryl to reapproximate the sphincter muscle in overlapping fashion, 3-0 vicryl rapide for remaining repair.  Rectum intact. Est. Blood Loss (mL):  Mom to postpartum.  Baby to Couplet care / Skin to Skin.  Chelsea Collins 09/25/2018, 2:50 PM

## 2018-09-21 ENCOUNTER — Encounter (HOSPITAL_COMMUNITY): Payer: Self-pay | Admitting: *Deleted

## 2018-09-21 ENCOUNTER — Telehealth (HOSPITAL_COMMUNITY): Payer: Self-pay | Admitting: *Deleted

## 2018-09-21 NOTE — Telephone Encounter (Signed)
Preadmission screen  

## 2018-09-24 NOTE — H&P (Deleted)
  The note originally documented on this encounter has been moved the the encounter in which it belongs.  

## 2018-09-24 NOTE — H&P (Signed)
Chelsea Collins is a 33 y.o. female G 1P0 at 40 weeks (EDD 09/25/18 by LMP c/w 10 week [place])  presenting for IOL at term.  Prenatal care significant for h/o seizure disorder stable on vimpat.  She has taken extra folic acid to decrease risk of craniofacial defects. Also generalized anxiety stable on buspirone.  OB History    Gravida  1   Para      Term      Preterm      AB      Living        SAB      TAB      Ectopic      Multiple      Live Births             Past Medical History:  Diagnosis Date  . Anxiety disorder   . Encephalomalacia 03/21/2018   Occipital bilateral  . GAD (generalized anxiety disorder) 03/21/2018  . Heat intolerance   . Hypoglycemia   . Meningitis   . Seizures (HCC)    as an infant   Past Surgical History:  Procedure Laterality Date  . EYE SURGERY    . TYMPANOSTOMY TUBE PLACEMENT     Family History: family history includes Autism in her brother; Bipolar disorder in her brother; Cancer in her maternal grandmother; Depression in her mother; Diabetes in her maternal grandmother; Heart attack in her maternal grandfather; Heart disease in her maternal grandfather; Hypertension in her maternal grandfather; Intellectual disability in her brother; Miscarriages / [place] in her mother. Social History:  reports that she has never smoked. She has never used smokeless tobacco. She reports that she does not drink alcohol or use drugs.     Maternal Diabetes: No Genetic Screening: Declined Maternal Ultrasounds/Referrals: Normal Fetal Ultrasounds or other Referrals:  None Maternal Substance Abuse:  No Significant Maternal Medications:  Meds include: Other: vimpat Significant Maternal Lab Results:  None Other Comments:  None  Review of Systems  Eyes: Negative for blurred vision.  Gastrointestinal: Negative for abdominal pain.  Neurological: Negative for headaches.   Maternal Medical History:  Contractions: Frequency: irregular.   Perceived severity is  mild.    Fetal activity: Perceived fetal activity is normal.    Prenatal complications: epilepsy  Prenatal Complications - Diabetes: none.      Last menstrual period 12/19/2017. Maternal Exam:  Uterine Assessment: Contraction strength is mild.  Contraction frequency is irregular.   Abdomen: Patient reports no abdominal tenderness. Fetal presentation: vertex  Introitus: Normal vulva. Normal vagina.  Pelvis: adequate for delivery.      Physical Exam  Constitutional: She appears well-developed and well-nourished.  Cardiovascular: Normal rate and regular rhythm.  Respiratory: Effort normal.  GI: Soft.  Genitourinary:    Vulva and vagina normal.   Neurological: She is alert.    Prenatal labs: ABO, Rh: A/Positive/-- (06/10 0000) Antibody: Negative (06/10 0000) Rubella: Nonimmune (06/10 0000) RPR: Nonreactive (06/10 0000)  HBsAg: Negative (06/10 0000)  HIV: Non-reactive (06/10 0000)  GBS: Positive (12/12 0000)  One hour GCT 89  Assessment/Plan: Pt admitted for IOL at term.  Will ripen with cytotec and then start  Pitocin in AM.  Continue vimpat   Oliver Pila 09/24/2018, 1:05 PM

## 2018-09-25 ENCOUNTER — Inpatient Hospital Stay (HOSPITAL_COMMUNITY): Payer: No Typology Code available for payment source | Admitting: Anesthesiology

## 2018-09-25 ENCOUNTER — Inpatient Hospital Stay (HOSPITAL_COMMUNITY)
Admission: RE | Admit: 2018-09-25 | Discharge: 2018-09-27 | DRG: 768 | Disposition: A | Discharge status: Home / Self Care | Payer: No Typology Code available for payment source | Attending: Obstetrics and Gynecology | Admitting: Obstetrics and Gynecology

## 2018-09-25 ENCOUNTER — Other Ambulatory Visit: Payer: Self-pay

## 2018-09-25 ENCOUNTER — Inpatient Hospital Stay (HOSPITAL_COMMUNITY)
Admission: AD | Admit: 2018-09-25 | Payer: No Typology Code available for payment source | Source: Ambulatory Visit | Admitting: Obstetrics and Gynecology

## 2018-09-25 ENCOUNTER — Encounter (HOSPITAL_COMMUNITY): Payer: Self-pay

## 2018-09-25 ENCOUNTER — Inpatient Hospital Stay (HOSPITAL_COMMUNITY)
Admission: RE | Admit: 2018-09-25 | Discharge: 2018-09-25 | Disposition: A | Discharge status: Home / Self Care | Payer: Self-pay | Source: Ambulatory Visit | Attending: Obstetrics and Gynecology | Admitting: Obstetrics and Gynecology

## 2018-09-25 DIAGNOSIS — F411 Generalized anxiety disorder: Secondary | ICD-10-CM | POA: Diagnosis present

## 2018-09-25 DIAGNOSIS — G40909 Epilepsy, unspecified, not intractable, without status epilepticus: Secondary | ICD-10-CM | POA: Diagnosis present

## 2018-09-25 DIAGNOSIS — O99354 Diseases of the nervous system complicating childbirth: Secondary | ICD-10-CM | POA: Diagnosis present

## 2018-09-25 DIAGNOSIS — Z3A40 40 weeks gestation of pregnancy: Secondary | ICD-10-CM

## 2018-09-25 DIAGNOSIS — O99344 Other mental disorders complicating childbirth: Secondary | ICD-10-CM | POA: Diagnosis present

## 2018-09-25 DIAGNOSIS — O480 Post-term pregnancy: Secondary | ICD-10-CM | POA: Diagnosis present

## 2018-09-25 DIAGNOSIS — O99824 Streptococcus B carrier state complicating childbirth: Secondary | ICD-10-CM | POA: Diagnosis present

## 2018-09-25 DIAGNOSIS — Z3483 Encounter for supervision of other normal pregnancy, third trimester: Secondary | ICD-10-CM | POA: Diagnosis present

## 2018-09-25 LAB — CBC
HEMATOCRIT: 39.3 % (ref 36.0–46.0)
Hemoglobin: 13.4 g/dL (ref 12.0–15.0)
MCH: 30.5 pg (ref 26.0–34.0)
MCHC: 34.1 g/dL (ref 30.0–36.0)
MCV: 89.5 fL (ref 80.0–100.0)
Platelets: 193 10*3/uL (ref 150–400)
RBC: 4.39 MIL/uL (ref 3.87–5.11)
RDW: 15.5 % (ref 11.5–15.5)
WBC: 11.5 10*3/uL — ABNORMAL HIGH (ref 4.0–10.5)

## 2018-09-25 LAB — RPR: RPR Ser Ql: NONREACTIVE

## 2018-09-25 LAB — TYPE AND SCREEN
ABO/RH(D): A POS
Antibody Screen: NEGATIVE

## 2018-09-25 LAB — ABO/RH: ABO/RH(D): A POS

## 2018-09-25 MED ORDER — WITCH HAZEL-GLYCERIN EX PADS: 1.0000 "application " | MEDICATED_PAD | CUTANEOUS | Status: DC | PRN

## 2018-09-25 MED ORDER — COCONUT OIL OIL: 1.0000 "application " | TOPICAL_OIL | Status: DC | PRN

## 2018-09-25 MED ORDER — EPHEDRINE 5 MG/ML INJ
10.0000 mg | INTRAVENOUS | Status: DC | PRN
  Filled 2018-09-25: qty 2

## 2018-09-25 MED ORDER — SIMETHICONE 80 MG PO CHEW: 80.0000 mg | CHEWABLE_TABLET | ORAL | Status: DC | PRN

## 2018-09-25 MED ORDER — FENTANYL 2.5 MCG/ML BUPIVACAINE 1/10 % EPIDURAL INFUSION (WH - ANES)
14.0000 mL/h | INTRAMUSCULAR | Status: DC | PRN
  Administered 2018-09-25: 14 mL/h via EPIDURAL
  Filled 2018-09-25: qty 100

## 2018-09-25 MED ORDER — OXYCODONE-ACETAMINOPHEN 5-325 MG PO TABS: 2.0000 | ORAL_TABLET | ORAL | Status: DC | PRN

## 2018-09-25 MED ORDER — OXYTOCIN 40 UNITS IN LACTATED RINGERS INFUSION - SIMPLE MED
1.0000 m[IU]/min | INTRAVENOUS | Status: DC
  Administered 2018-09-25: 2 m[IU]/min via INTRAVENOUS
  Filled 2018-09-25: qty 1000

## 2018-09-25 MED ORDER — OXYCODONE HCL 5 MG PO TABS
5.0000 mg | ORAL_TABLET | ORAL | Status: DC | PRN
  Administered 2018-09-26: 5 mg via ORAL
  Filled 2018-09-25: qty 1

## 2018-09-25 MED ORDER — OXYTOCIN 40 UNITS IN LACTATED RINGERS INFUSION - SIMPLE MED: 2.5000 [IU]/h | INTRAVENOUS | Status: DC

## 2018-09-25 MED ORDER — PRENATAL MULTIVITAMIN CH
1.0000 | ORAL_TABLET | Freq: Every day | ORAL | Status: DC
  Administered 2018-09-26: 1 via ORAL
  Filled 2018-09-25 (×2): qty 1

## 2018-09-25 MED ORDER — ONDANSETRON HCL 4 MG/2ML IJ SOLN: 4.0000 mg | INTRAMUSCULAR | Status: DC | PRN

## 2018-09-25 MED ORDER — DIBUCAINE 1 % RE OINT
1.0000 "application " | TOPICAL_OINTMENT | RECTAL | Status: DC | PRN
  Filled 2018-09-25: qty 28

## 2018-09-25 MED ORDER — SODIUM CHLORIDE 0.9 % IV SOLN
5.0000 10*6.[IU] | Freq: Once | INTRAVENOUS | Status: AC
  Administered 2018-09-25: 5 10*6.[IU] via INTRAVENOUS
  Filled 2018-09-25: qty 5

## 2018-09-25 MED ORDER — DOCUSATE SODIUM 100 MG PO CAPS
100.0000 mg | ORAL_CAPSULE | Freq: Two times a day (BID) | ORAL | Status: DC
  Administered 2018-09-25 – 2018-09-27 (×4): 100 mg via ORAL
  Filled 2018-09-25 (×4): qty 1

## 2018-09-25 MED ORDER — SOD CITRATE-CITRIC ACID 500-334 MG/5ML PO SOLN: 30.0000 mL | ORAL | Status: DC | PRN

## 2018-09-25 MED ORDER — PHENYLEPHRINE 40 MCG/ML (10ML) SYRINGE FOR IV PUSH (FOR BLOOD PRESSURE SUPPORT)
80.0000 ug | PREFILLED_SYRINGE | INTRAVENOUS | Status: DC | PRN
  Filled 2018-09-25 (×2): qty 10

## 2018-09-25 MED ORDER — LIDOCAINE HCL (PF) 1 % IJ SOLN
30.0000 mL | INTRAMUSCULAR | Status: DC | PRN
  Filled 2018-09-25: qty 30

## 2018-09-25 MED ORDER — PHENYLEPHRINE 40 MCG/ML (10ML) SYRINGE FOR IV PUSH (FOR BLOOD PRESSURE SUPPORT)
80.0000 ug | PREFILLED_SYRINGE | INTRAVENOUS | Status: DC | PRN
  Filled 2018-09-25: qty 10

## 2018-09-25 MED ORDER — BENZOCAINE-MENTHOL 20-0.5 % EX AERO
1.0000 "application " | INHALATION_SPRAY | CUTANEOUS | Status: DC | PRN
  Administered 2018-09-26: 1 via TOPICAL
  Filled 2018-09-25: qty 56

## 2018-09-25 MED ORDER — MISOPROSTOL 25 MCG QUARTER TABLET
25.0000 ug | ORAL_TABLET | ORAL | Status: AC | PRN
  Administered 2018-09-25 (×2): 25 ug via VAGINAL
  Filled 2018-09-25 (×2): qty 1

## 2018-09-25 MED ORDER — DIPHENHYDRAMINE HCL 25 MG PO CAPS: 25.0000 mg | ORAL_CAPSULE | Freq: Four times a day (QID) | ORAL | Status: DC | PRN

## 2018-09-25 MED ORDER — FENTANYL CITRATE (PF) 100 MCG/2ML IJ SOLN
50.0000 ug | INTRAMUSCULAR | Status: DC | PRN
  Administered 2018-09-25 (×2): 50 ug via INTRAVENOUS
  Filled 2018-09-25 (×2): qty 2

## 2018-09-25 MED ORDER — LACTATED RINGERS IV SOLN
500.0000 mL | INTRAVENOUS | Status: DC | PRN
  Administered 2018-09-25: 500 mL via INTRAVENOUS

## 2018-09-25 MED ORDER — OXYTOCIN BOLUS FROM INFUSION
500.0000 mL | Freq: Once | INTRAVENOUS | Status: AC
  Administered 2018-09-25: 500 mL via INTRAVENOUS

## 2018-09-25 MED ORDER — SENNOSIDES-DOCUSATE SODIUM 8.6-50 MG PO TABS
2.0000 | ORAL_TABLET | ORAL | Status: DC
  Administered 2018-09-25 – 2018-09-26 (×2): 2 via ORAL
  Filled 2018-09-25 (×2): qty 2

## 2018-09-25 MED ORDER — LACTATED RINGERS IV SOLN
INTRAVENOUS | Status: DC
  Administered 2018-09-25 (×4): via INTRAVENOUS

## 2018-09-25 MED ORDER — ONDANSETRON HCL 4 MG/2ML IJ SOLN
4.0000 mg | Freq: Four times a day (QID) | INTRAMUSCULAR | Status: DC | PRN
  Administered 2018-09-25: 4 mg via INTRAVENOUS
  Filled 2018-09-25: qty 2

## 2018-09-25 MED ORDER — ACETAMINOPHEN 325 MG PO TABS
650.0000 mg | ORAL_TABLET | ORAL | Status: DC | PRN
  Administered 2018-09-26: 650 mg via ORAL
  Filled 2018-09-25: qty 2

## 2018-09-25 MED ORDER — ACETAMINOPHEN 325 MG PO TABS: 650.0000 mg | ORAL_TABLET | ORAL | Status: DC | PRN

## 2018-09-25 MED ORDER — BUSPIRONE HCL 10 MG PO TABS
20.0000 mg | ORAL_TABLET | Freq: Two times a day (BID) | ORAL | Status: DC
  Administered 2018-09-26 – 2018-09-27 (×3): 20 mg via ORAL
  Filled 2018-09-25 (×5): qty 2

## 2018-09-25 MED ORDER — IBUPROFEN 600 MG PO TABS
600.0000 mg | ORAL_TABLET | Freq: Four times a day (QID) | ORAL | Status: DC
  Administered 2018-09-25 – 2018-09-27 (×8): 600 mg via ORAL
  Filled 2018-09-25 (×8): qty 1

## 2018-09-25 MED ORDER — OXYCODONE HCL 5 MG PO TABS
10.0000 mg | ORAL_TABLET | ORAL | Status: DC | PRN
  Administered 2018-09-26: 10 mg via ORAL
  Filled 2018-09-25: qty 2

## 2018-09-25 MED ORDER — TERBUTALINE SULFATE 1 MG/ML IJ SOLN
0.2500 mg | Freq: Once | INTRAMUSCULAR | Status: DC | PRN
  Filled 2018-09-25: qty 1

## 2018-09-25 MED ORDER — LIDOCAINE HCL (PF) 1 % IJ SOLN
INTRAMUSCULAR | Status: DC | PRN
  Administered 2018-09-25 (×2): 4 mL via EPIDURAL

## 2018-09-25 MED ORDER — FENTANYL CITRATE (PF) 100 MCG/2ML IJ SOLN: 50.0000 ug | INTRAMUSCULAR | Status: DC | PRN

## 2018-09-25 MED ORDER — LACTATED RINGERS IV SOLN
500.0000 mL | Freq: Once | INTRAVENOUS | Status: AC
  Administered 2018-09-25: 500 mL via INTRAVENOUS

## 2018-09-25 MED ORDER — OXYCODONE-ACETAMINOPHEN 5-325 MG PO TABS: 1.0000 | ORAL_TABLET | ORAL | Status: DC | PRN

## 2018-09-25 MED ORDER — PENICILLIN G 3 MILLION UNITS IVPB - SIMPLE MED
3.0000 10*6.[IU] | INTRAVENOUS | Status: DC
  Administered 2018-09-25 (×2): 3 10*6.[IU] via INTRAVENOUS
  Filled 2018-09-25 (×5): qty 100

## 2018-09-25 MED ORDER — DIPHENHYDRAMINE HCL 50 MG/ML IJ SOLN: 12.5000 mg | INTRAMUSCULAR | Status: DC | PRN

## 2018-09-25 MED ORDER — TETANUS-DIPHTH-ACELL PERTUSSIS 5-2.5-18.5 LF-MCG/0.5 IM SUSP: 0.5000 mL | Freq: Once | INTRAMUSCULAR | Status: DC

## 2018-09-25 MED ORDER — OXCARBAZEPINE 300 MG PO TABS
900.0000 mg | ORAL_TABLET | Freq: Two times a day (BID) | ORAL | Status: DC
  Administered 2018-09-25 – 2018-09-27 (×5): 900 mg via ORAL
  Filled 2018-09-25 (×7): qty 3

## 2018-09-25 MED ORDER — ZOLPIDEM TARTRATE 5 MG PO TABS: 5.0000 mg | ORAL_TABLET | Freq: Every evening | ORAL | Status: DC | PRN

## 2018-09-25 MED ORDER — ONDANSETRON HCL 4 MG PO TABS
4.0000 mg | ORAL_TABLET | ORAL | Status: DC | PRN
  Administered 2018-09-26: 4 mg via ORAL
  Filled 2018-09-25 (×2): qty 1

## 2018-09-25 NOTE — Anesthesia Pain Management Evaluation Note (Signed)
  CRNA Pain Management Visit Note  Patient: Chelsea Collins, 33 y.o., female  "Hello I am a member of the anesthesia team at [facility]. We have an anesthesia team available at all times to provide care throughout the hospital, including epidural management and anesthesia for C-section. I don't know your plan for the delivery whether it a natural birth, water birth, IV sedation, nitrous supplementation, doula or epidural, but we want to meet your pain goals."   1.Was your pain managed to your expectations on prior hospitalizations?   No prior hospitalizations  2.What is your expectation for pain management during this hospitalization?     Epidural  3.How can we help you reach that goal?   Record the patient's initial score and the patient's pain goal.   Pain: 0  Pain Goal: 3 [facility] wants you to be able to say your pain was always managed very well.  Laban Emperor 09/25/2018

## 2018-09-25 NOTE — Anesthesia Preprocedure Evaluation (Signed)
Anesthesia Evaluation  Patient identified by MRN, date of birth, ID band Patient awake    Reviewed: Allergy & Precautions, NPO status , Patient's Chart, lab work & pertinent test results  History of Anesthesia Complications Negative for: history of anesthetic complications  Airway Mallampati: III  TM Distance: >3 FB Neck ROM: Full    Dental no notable dental hx. (+) Dental Advisory Given   Pulmonary neg pulmonary ROS,    Pulmonary exam normal        Cardiovascular negative cardio ROS Normal cardiovascular exam     Neuro/Psych negative neurological ROS  negative psych ROS   GI/Hepatic negative GI ROS, Neg liver ROS,   Endo/Other  negative endocrine ROS  Renal/GU negative Renal ROS  negative genitourinary   Musculoskeletal negative musculoskeletal ROS (+)   Abdominal   Peds negative pediatric ROS (+)  Hematology negative hematology ROS (+)   Anesthesia Other Findings   Reproductive/Obstetrics (+) Pregnancy                             Anesthesia Physical Anesthesia Plan  ASA: II  Anesthesia Plan: Epidural   Post-op Pain Management:    Induction:   PONV Risk Score and Plan:   Airway Management Planned: Natural Airway  Additional Equipment:   Intra-op Plan:   Post-operative Plan:   Informed Consent: I have reviewed the patients History and Physical, chart, labs and discussed the procedure including the risks, benefits and alternatives for the proposed anesthesia with the patient or authorized representative who has indicated his/her understanding and acceptance.   Dental advisory given  Plan Discussed with: Anesthesiologist  Anesthesia Plan Comments:         Anesthesia Quick Evaluation

## 2018-09-25 NOTE — Progress Notes (Signed)
Patient ID: Chelsea Collins, female   DOB: 01/15/1986, 33 y.o.   MRN: [id number] Pt received 2 doses of cytotec and became very uncomfortable so received epidural  afeb VSS maternal pulse 70-80's FHR overall category 1 baseline 110-115 with occasional mild variable decelerations and great scalp stim/accels  Cervix c/4/-1 AROM clear  FSE and IUPC placed to follow FHR closely given lower baseline.  Will start pitocin if needed and MVU's not adequate

## 2018-09-25 NOTE — Anesthesia Procedure Notes (Signed)
Epidural Patient location during procedure: OB Start time: 09/25/2018 7:47 AM End time: 09/25/2018 7:58 AM  Staffing Anesthesiologist: Heather Roberts, MD Performed: anesthesiologist   Preanesthetic Checklist Completed: patient identified, site marked, pre-op evaluation, timeout performed, IV checked, risks and benefits discussed and monitors and equipment checked  Epidural Patient position: sitting Prep: DuraPrep Patient monitoring: heart rate, cardiac monitor, continuous pulse ox and blood pressure Approach: midline Location: L2-L3 Injection technique: LOR saline  Needle:  Needle type: Tuohy  Needle gauge: 17 G Needle length: 9 cm Needle insertion depth: 6 cm Catheter size: 20 Guage Catheter at skin depth: 10 cm Test dose: negative and Other  Assessment Events: blood not aspirated, injection not painful, no injection resistance and negative IV test  Additional Notes Informed consent obtained prior to proceeding including risk of failure, 1% risk of PDPH, risk of minor discomfort and bruising.  Discussed rare but serious complications including epidural abscess, permanent nerve injury, epidural hematoma.  Discussed alternatives to epidural analgesia and patient desires to proceed.  Timeout performed pre-procedure verifying patient name, procedure, and platelet count.  Patient tolerated procedure well.

## 2018-09-25 NOTE — Progress Notes (Signed)
Patient ID: Chelsea Collins, female   DOB: October 13, 1985, 33 y.o.   MRN: [id number] Pt comfortable with epidural  afeb VSS FHR category 1 with occasional mild variables  C/9+/+1  Will recheck in 30-45 min to see if can begin trial of pushing

## 2018-09-26 LAB — CBC
HEMATOCRIT: 30 % — AB (ref 36.0–46.0)
Hemoglobin: 10.3 g/dL — ABNORMAL LOW (ref 12.0–15.0)
MCH: 31.2 pg (ref 26.0–34.0)
MCHC: 34.3 g/dL (ref 30.0–36.0)
MCV: 90.9 fL (ref 80.0–100.0)
Platelets: 146 10*3/uL — ABNORMAL LOW (ref 150–400)
RBC: 3.3 MIL/uL — ABNORMAL LOW (ref 3.87–5.11)
RDW: 15.5 % (ref 11.5–15.5)
WBC: 13.4 10*3/uL — AB (ref 4.0–10.5)
nRBC: 0 % (ref 0.0–0.2)

## 2018-09-26 NOTE — Progress Notes (Signed)
PPD #1 No problems Afeb, VSS Fundus firm, NT at U-1 Continue routine postpartum care 

## 2018-09-26 NOTE — Anesthesia Postprocedure Evaluation (Signed)
Anesthesia Post Note  Patient: Chelsea Collins  Procedure(s) Performed: AN AD HOC LABOR EPIDURAL     Patient location during evaluation: Mother Baby Anesthesia Type: Epidural Level of consciousness: awake and alert and oriented Pain management: satisfactory to patient Vital Signs Assessment: post-procedure vital signs reviewed and stable Respiratory status: spontaneous breathing and nonlabored ventilation Cardiovascular status: stable Postop Assessment: no headache, no backache, no signs of nausea or vomiting, adequate PO intake and patient able to bend at knees (patient up walking) Anesthetic complications: no    Last Vitals:  Vitals:   09/26/18 0145 09/26/18 0516  BP: 101/66 102/63  Pulse: 67 67  Resp: 18 18  Temp: (!) 36.3 C 36.8 C  SpO2:      Last Pain:  Vitals:   09/26/18 0516  TempSrc: Oral  PainSc: 3    Pain Goal:                 Madison Hickman

## 2018-09-27 MED ORDER — PRENATAL 1 30-0.975-200 MG PO CAPS: 1.0000 | ORAL_CAPSULE | Freq: Every morning | ORAL | 3 refills | Status: DC

## 2018-09-27 MED ORDER — MEASLES, MUMPS & RUBELLA VAC IJ SOLR
0.5000 mL | Freq: Once | INTRAMUSCULAR | Status: AC
  Administered 2018-09-27: 0.5 mL via SUBCUTANEOUS
  Filled 2018-09-27: qty 0.5

## 2018-09-27 MED ORDER — ONDANSETRON 8 MG PO TBDP
8.0000 mg | ORAL_TABLET | Freq: Once | ORAL | Status: AC
  Administered 2018-09-27: 8 mg via ORAL
  Filled 2018-09-27: qty 1

## 2018-09-27 MED ORDER — IBUPROFEN 600 MG PO TABS: 600.0000 mg | ORAL_TABLET | Freq: Four times a day (QID) | ORAL | 1 refills | Status: DC | PRN

## 2018-09-27 MED ORDER — LACTATED RINGERS IV BOLUS
500.0000 mL | Freq: Once | INTRAVENOUS | Status: AC
  Administered 2018-09-27: 500 mL via INTRAVENOUS

## 2018-09-27 MED FILL — IBUPROFEN 600 MG TABLET: 600 | 11 days supply | Qty: 45 | Fill #0

## 2018-09-27 NOTE — Discharge Summary (Signed)
OB Discharge Summary     Patient Name: Chelsea Collins DOB: [birth year] MRN: [id number]  Date of admission: 09/25/2018 Delivering MD: Huel Cote   Date of discharge: 09/27/2018  Admitting diagnosis: INDUCTION Intrauterine pregnancy: [redacted]w[redacted]d     Secondary diagnosis:  Active Problems:   Indication for care in labor and delivery, antepartum   Vacuum extractor delivery, delivered  Additional problems: none     Discharge diagnosis: Term Pregnancy Delivered                                                                                                Post partum procedures:N/A  Augmentation: AROM, Pitocin and Cytotec  Complications: None  Hospital course:  Induction of Labor With Vaginal Delivery   33 y.o. yo G1P1001 at [redacted]w[redacted]d was admitted to the hospital 09/25/2018 for induction of labor.  Indication for induction: Postdates.  Patient had an uncomplicated labor course as follows: Membrane Rupture Time/Date: 8:35 AM ,09/25/2018   Intrapartum Procedures: Episiotomy: None [1]                                         Lacerations:  3rd degree [4];Perineal [11]  Patient had delivery of a Viable infant.  Information for the patient's newborn:  Darletha, Seamon [[id number]]  Delivery Method: Vaginal, Vacuum (Extractor)(Filed from Delivery Summary)   09/25/2018  Details of delivery can be found in separate delivery note.  Patient had a routine postpartum course. Patient is discharged home 09/27/18.  Physical exam  Vitals:   09/26/18 0516 09/26/18 1428 09/26/18 2353 09/27/18 0600  BP: 102/63 (!) 101/57 109/72 113/74  Pulse: 67 68 91 (!) 59  Resp: 18 16 16 16   Temp: 98.2 F (36.8 C) 97.9 F (36.6 C) 98.2 F (36.8 C) 97.6 F (36.4 C)  TempSrc: Oral Oral Oral Oral  SpO2:   100% 98%  Weight:      Height:       General: alert and no distress Lochia: appropriate Uterine Fundus: firm  Labs: Lab Results  Component Value Date   WBC 13.4 (H) 09/26/2018   HGB 10.3 (L) 09/26/2018   HCT 30.0 (L) 09/26/2018   MCV 90.9 09/26/2018   PLT 146 (L) 09/26/2018   CMP Latest Ref Rng & Units 12/22/2015  Glucose 65 - 99 mg/dL 92  BUN 6 - 20 mg/dL 8  Creatinine 9.93 - 5.70 mg/dL 1.77  Sodium 939 - 030 mmol/L 140  Potassium 3.5 - 5.2 mmol/L 4.4  Chloride 96 - 106 mmol/L 102  CO2 18 - 29 mmol/L 20  Calcium 8.7 - 10.2 mg/dL 9.3  Total Protein 6.0 - 8.5 g/dL 6.9  Total Bilirubin 0.0 - 1.2 mg/dL 0.7  Alkaline Phos 39 - 117 IU/L 54  AST 0 - 40 IU/L 20  ALT 0 - 32 IU/L 35(H)    Discharge instruction: per After Visit Summary and "Baby and Me Booklet".  After visit meds:  Allergies as of 09/27/2018      Reactions   Keppra [  levetiracetam] Swelling, Rash   Lamictal [lamotrigine] Rash      Medication List    STOP taking these medications   folic acid 1 MG tablet Commonly known as:  FOLVITE     TAKE these medications   busPIRone 10 MG tablet Commonly known as:  BUSPAR Take 2 tablets (20 mg total) by mouth 2 (two) times daily.   Doxylamine-Pyridoxine 10-10 MG Tbec   ibuprofen 600 MG tablet Commonly known as:  ADVIL,MOTRIN Take 1 tablet (600 mg total) by mouth every 6 (six) hours as needed.   Oxcarbazepine 300 MG tablet Commonly known as:  TRILEPTAL Take (2) Tablets in AM and (3) Tables in PM for 1 week, then increase to (3) Tablets twice daily   PRENATAL 1 30-0.975-200 MG Caps Take 1 tablet by mouth every morning. What changed:    medication strength  how much to take  when to take this       Diet: routine diet  Activity: Advance as tolerated. Pelvic rest for 6 weeks.   Outpatient follow up:6 weeks Follow up Appt: Future Appointments  Date Time Provider Department Center  11/29/2018  8:30 AM Van ClinesAquino, Karen M, MD [facility] None  03/01/2019  9:00 AM Willow OraAndy, Camille L, MD [facility]   Follow up Visit:No follow-ups on file.  Postpartum contraception: Undecided  Newborn Data: Live born female  Birth Weight: 7 lb 1.6 oz (3220 g) APGAR: 9, 9  Newborn  Delivery   Birth date/time:  09/25/2018 14:03:00 Delivery type:  Vaginal, Vacuum (Extractor)     Baby Feeding: Bottle Disposition:home with mother   09/27/2018 Sherian ReinJody Bovard-Stuckert, MD

## 2018-09-27 NOTE — Progress Notes (Signed)
MOB was referred for history of depression/anxiety. * Referral screened out by Clinical Social Worker because none of the following criteria appear to apply: ~ History of anxiety/depression during this pregnancy, or of post-partum depression following prior delivery. ~ Diagnosis of anxiety and/or depression within last 3 years OR * MOB's symptoms currently being treated with medication and/or therapy. MOB has an active Rx for Buspar.  Please contact the Clinical Social Worker if needs arise, by MOB request, or if MOB scores greater than 9/yes to question 10 on Edinburgh Postpartum Depression Screen.  Fabio Wah Boyd-Gilyard, MSW, LCSW Clinical Social Work [phone] 

## 2018-09-27 NOTE — Progress Notes (Addendum)
Post Partum Day 2 Subjective: no complaints, up ad lib, voiding, tolerating PO and nl lochia, pain controlled  Objective: Blood pressure 113/74, pulse (!) 59, temperature 97.6 F (36.4 C), temperature source Oral, resp. rate 16, height 5' 0.75" (1.543 m), weight 57.1 kg, last menstrual period 12/19/2017, SpO2 98 %, unknown if currently breastfeeding.  Physical Exam:  General: alert and no distress Lochia: appropriate Uterine Fundus: firm   Recent Labs    09/25/18 0058 09/26/18 0519  HGB 13.4 10.3*  HCT 39.3 30.0*    Assessment/Plan: Plan for discharge tomorrow,  Routine postpartum care D/c with Motrin and PNV.  F/u 6 weeks.     LOS: 2 days   Marco Adelson Bovard-Stuckert 09/27/2018, 8:17 AM

## 2018-09-29 MED FILL — oxyCODONE HCL 5 MG TABS: 5 | 3 days supply | Qty: 20 | Fill #0

## 2018-10-26 ENCOUNTER — Telehealth: Payer: Self-pay | Admitting: *Deleted

## 2018-10-26 DIAGNOSIS — G40209 Localization-related (focal) (partial) symptomatic epilepsy and epileptic syndromes with complex partial seizures, not intractable, without status epilepticus: Secondary | ICD-10-CM

## 2018-10-26 NOTE — Telephone Encounter (Signed)
Copied from CRM [id number]. Topic: Referral - Request for Referral >> Oct 25, 2018  1:50 PM Arlyss Gandy, NT wrote: Has patient seen PCP for this complaint? Yes.   *If NO, is insurance requiring patient see PCP for this issue before PCP can refer them? Referral for which specialty: Neuro  Preferred provider/office: [facility], Dr. Van Clines  Reason for referral: For insurance purposes due to seizure disorder. Pt has an appt 11/29/2018 and needs referral by then.

## 2018-10-30 ENCOUNTER — Encounter: Payer: Self-pay | Admitting: Family Medicine

## 2018-10-30 ENCOUNTER — Ambulatory Visit (INDEPENDENT_AMBULATORY_CARE_PROVIDER_SITE_OTHER): Payer: No Typology Code available for payment source | Admitting: Family Medicine

## 2018-10-30 ENCOUNTER — Other Ambulatory Visit: Payer: Self-pay

## 2018-10-30 VITALS — BP 98/58 | HR 62 | Temp 98.1°F | Resp 14 | Ht 63.0 in | Wt 120.6 lb

## 2018-10-30 DIAGNOSIS — K644 Residual hemorrhoidal skin tags: Secondary | ICD-10-CM

## 2018-10-30 MED ORDER — HYDROCORTISONE 1 % EX CREA: 1.0000 "application " | TOPICAL_CREAM | Freq: Two times a day (BID) | CUTANEOUS | 0 refills | Status: DC

## 2018-10-30 NOTE — Progress Notes (Signed)
Subjective  CC:  Chief Complaint  Patient presents with  . Possible hemorrhoids    Recently had a baby 10/15/18.Marland Kitchen Went away then returned, has used OTC hemorrhoid    HPI: Chariah Muhammad is a 33 y.o. female who presents to the office today to address the problems listed above in the chief complaint.  33 year old G1, P59, daughters 83 weeks old, presents for painful hemorrhoid.  Reports she had hemorrhoids with pregnancy.  Did well until couple weeks ago.  Several days ago had painful swollen hemorrhoid that was enlarged.  Over the weekend, symptoms have improved.  Mild soreness remains present.  No constipation.  Assessment  1. External hemorrhoids      Plan   External hemorrhoids: Reassured.  Topical steroid cream recommended.  No sign of thrombosed hemorrhoid or rectal mass.  Follow up: Follow-up as needed and for complete physical on 03/01/2019  No orders of the defined types were placed in this encounter.  Meds ordered this encounter  Medications  . hydrocortisone cream 1 %    Sig: Apply 1 application topically 2 (two) times daily.    Dispense:  30 g    Refill:  0      I reviewed the patients updated PMH, FH, and SocHx.    Patient Active Problem List   Diagnosis Date Noted  . Complex partial seizure (HCC) 03/21/2018  . Encephalomalacia 03/21/2018  . Anxiety disorder 03/21/2018  . Epilepsy (HCC) 01/30/2017  . Abnormal MRI of head 10/27/2015  . History of bacterial meningitis in infancy 10/27/2015   Current Meds  Medication Sig  . busPIRone (BUSPAR) 10 MG tablet Take 2 tablets (20 mg total) by mouth 2 (two) times daily.  . Oxcarbazepine (TRILEPTAL) 300 MG tablet Take (2) Tablets in AM and (3) Tables in PM for 1 week, then increase to (3) Tablets twice daily    Allergies: Patient is allergic to keppra [levetiracetam] and lamictal [lamotrigine]. Family History: Patient family history includes Autism in her brother; Bipolar disorder in her brother; Cancer in her  maternal grandmother; Depression in her mother; Diabetes in her maternal grandmother; Heart attack in her maternal grandfather; Heart disease in her maternal grandfather; Hypertension in her maternal grandfather; Intellectual disability in her brother; Miscarriages / [place] in her mother. Social History:  Patient  reports that she has never smoked. She has never used smokeless tobacco. She reports that she does not drink alcohol or use drugs.  Review of Systems: Constitutional: Negative for fever malaise or anorexia Cardiovascular: negative for chest pain Respiratory: negative for SOB or persistent cough Gastrointestinal: negative for abdominal pain  Objective  Vitals: BP (!) 98/58   Pulse 62   Temp 98.1 F (36.7 C) (Oral)   Resp 14   Ht 5\' 3"  (1.6 m)   Wt 120 lb 9.6 oz (54.7 kg)   SpO2 98%   Breastfeeding No   BMI 21.36 kg/m  General: no acute distress , A&Ox3 Rectal exam: 3 mm inflamed external hemorrhoid present.  No active bleeding.  No thrombosis.    Commons side effects, risks, benefits, and alternatives for medications and treatment plan prescribed today were discussed, and the patient expressed understanding of the given instructions. Patient is instructed to call or message via MyChart if he/she has any questions or concerns regarding our treatment plan. No barriers to understanding were identified. We discussed Red Flag symptoms and signs in detail. Patient expressed understanding regarding what to do in case of urgent or emergency type symptoms.  Medication list was reconciled, printed and provided to the patient in AVS. Patient instructions and summary information was reviewed with the patient as documented in the AVS. This note was prepared with assistance of Dragon voice recognition software. Occasional wrong-word or sound-a-like substitutions may have occurred due to the inherent limitations of voice recognition software

## 2018-10-30 NOTE — Patient Instructions (Signed)
Please follow up as scheduled for your next visit with me: 03/01/2019 for your physical.  Use the steroid cream nightly for the next week to help calm down the inflammation and help with pain.  If you have any questions or concerns, please don't hesitate to send me a message via MyChart or call the office at [phone]. Thank you for visiting with [place] today! It's our pleasure caring for you.   Hemorrhoids Hemorrhoids are swollen veins in and around the rectum or anus. There are two types of hemorrhoids:  Internal hemorrhoids. These occur in the veins that are just inside the rectum. They may poke through to the outside and become irritated and painful.  External hemorrhoids. These occur in the veins that are outside the anus and can be felt as a painful swelling or hard lump near the anus. Most hemorrhoids do not cause serious problems, and they can be managed with home treatments such as diet and lifestyle changes. If home treatments do not help the symptoms, procedures can be done to shrink or remove the hemorrhoids. What are the causes? This condition is caused by increased pressure in the anal area. This pressure may result from various things, including:  Constipation.  Straining to have a bowel movement.  Diarrhea.  Pregnancy.  Obesity.  Sitting for long periods of time.  Heavy lifting or other activity that causes you to strain.  Anal sex.  Riding a bike for a long period of time. What are the signs or symptoms? Symptoms of this condition include:  Pain.  Anal itching or irritation.  Rectal bleeding.  Leakage of stool (feces).  Anal swelling.  One or more lumps around the anus. How is this diagnosed? This condition can often be diagnosed through a visual exam. Other exams or tests may also be done, such as:  An exam that involves feeling the rectal area with a gloved hand (digital rectal exam).  An exam of the anal canal that is done using a small tube  (anoscope).  A blood test, if you have lost a significant amount of blood.  A test to look inside the colon using a flexible tube with a camera on the end (sigmoidoscopy or colonoscopy). How is this treated? This condition can usually be treated at home. However, various procedures may be done if dietary changes, lifestyle changes, and other home treatments do not help your symptoms. These procedures can help make the hemorrhoids smaller or remove them completely. Some of these procedures involve surgery, and others do not. Common procedures include:  Rubber band ligation. Rubber bands are placed at the base of the hemorrhoids to cut off their blood supply.  Sclerotherapy. Medicine is injected into the hemorrhoids to shrink them.  Infrared coagulation. A type of light energy is used to get rid of the hemorrhoids.  Hemorrhoidectomy surgery. The hemorrhoids are surgically removed, and the veins that supply them are tied off.  Stapled hemorrhoidopexy surgery. The surgeon staples the base of the hemorrhoid to the rectal wall. Follow these instructions at home: Eating and drinking   Eat foods that have a lot of fiber in them, such as whole grains, beans, nuts, fruits, and vegetables.  Ask your health care provider about taking products that have added fiber (fiber supplements).  Reduce the amount of fat in your diet. You can do this by eating low-fat dairy products, eating less red meat, and avoiding processed foods.  Drink enough fluid to keep your urine pale yellow. Managing pain and  swelling   Take warm sitz baths for 20 minutes, 3-4 times a day to ease pain and discomfort. You may do this in a bathtub or using a portable sitz bath that fits over the toilet.  If directed, apply ice to the affected area. Using ice packs between sitz baths may be helpful. ? Put ice in a plastic bag. ? Place a towel between your skin and the bag. ? Leave the ice on for 20 minutes, 2-3 times a  day. General instructions  Take over-the-counter and prescription medicines only as told by your health care provider.  Use medicated creams or suppositories as told.  Get regular exercise. Ask your health care provider how much and what kind of exercise is best for you. In general, you should do moderate exercise for at least 30 minutes on most days of the week (150 minutes each week). This can include activities such as walking, biking, or yoga.  Go to the bathroom when you have the urge to have a bowel movement. Do not wait.  Avoid straining to have bowel movements.  Keep the anal area dry and clean. Use wet toilet paper or moist towelettes after a bowel movement.  Do not sit on the toilet for long periods of time. This increases blood pooling and pain.  Keep all follow-up visits as told by your health care provider. This is important. Contact a health care provider if you have:  Increasing pain and swelling that are not controlled by treatment or medicine.  Difficulty having a bowel movement, or you are unable to have a bowel movement.  Pain or inflammation outside the area of the hemorrhoids. Get help right away if you have:  Uncontrolled bleeding from your rectum. Summary  Hemorrhoids are swollen veins in and around the rectum or anus.  Most hemorrhoids can be managed with home treatments such as diet and lifestyle changes.  Taking warm sitz baths can help ease pain and discomfort.  In severe cases, procedures or surgery can be done to shrink or remove the hemorrhoids. This information is not intended to replace advice given to you by your health care provider. Make sure you discuss any questions you have with your health care provider. Document Released: 09/03/2000 Document Revised: 01/26/2018 Document Reviewed: 01/26/2018 Elsevier Interactive Patient Education  2019 [employer].

## 2018-11-16 MED FILL — SERTRALINE HCL 25 MG TABLET: 25 | 30 days supply | Qty: 30 | Fill #0

## 2018-11-24 ENCOUNTER — Telehealth: Payer: Self-pay | Admitting: *Deleted

## 2018-11-24 DIAGNOSIS — N8189 Other female genital prolapse: Secondary | ICD-10-CM

## 2018-11-24 NOTE — Telephone Encounter (Signed)
Due to insurance PCP needs to [facility] referral  Copied from CRM [id number]. Topic: Referral - Request for Referral >> Nov 24, 2018  3:13 PM Percival Spanish wrote:  Pt said her ob is referring her to a PT and she is asking Dr Mardelle Matte to send a referral to [facility]  Kingsley Callander   Reason Pelvic floor

## 2018-11-24 NOTE — Telephone Encounter (Signed)
Pt aware.

## 2018-11-29 ENCOUNTER — Encounter: Payer: Self-pay | Admitting: Neurology

## 2018-11-29 ENCOUNTER — Ambulatory Visit: Payer: No Typology Code available for payment source | Admitting: Neurology

## 2018-11-29 ENCOUNTER — Other Ambulatory Visit: Payer: Self-pay

## 2018-11-29 VITALS — BP 98/60 | HR 71 | Ht 63.0 in | Wt 102.0 lb

## 2018-11-29 DIAGNOSIS — G40309 Generalized idiopathic epilepsy and epileptic syndromes, not intractable, without status epilepticus: Secondary | ICD-10-CM

## 2018-11-29 MED ORDER — OXCARBAZEPINE 300 MG PO TABS: ORAL_TABLET | ORAL | 3 refills | Status: DC

## 2018-11-29 MED FILL — OXcarbazepine 300 MG TABS: 300 | 90 days supply | Qty: 180 | Fill #0

## 2018-11-29 NOTE — Patient Instructions (Signed)
Congratulations! Continue Trileptal 300mg  twice a day. Follow-up in 8 months, call for any changes.  Seizure Precautions: 1. If medication has been prescribed for you to prevent seizures, take it exactly as directed.  Do not stop taking the medicine without talking to your doctor first, even if you have not had a seizure in a long time.   2. Avoid activities in which a seizure would cause danger to yourself or to others.  Don't operate dangerous machinery, swim alone, or climb in high or dangerous places, such as on ladders, roofs, or girders.  Do not drive unless your doctor says you may.  3. If you have any warning that you may have a seizure, lay down in a safe place where you can't hurt yourself.    4.  No driving for 6 months from last seizure, as per [facility].   Please refer to the following link on the Epilepsy Foundation of America's website for more information: http://www.epilepsyfoundation.org/answerplace/Social/driving/drivingu.cfm   5.  Maintain good sleep hygiene. Avoid alcohol.  6.  Notify your neurology if you are planning pregnancy or if you become pregnant.  7.  Contact your doctor if you have any problems that may be related to the medicine you are taking.  8.  Call 911 and bring the patient back to the ED if:        A.  The seizure lasts longer than 5 minutes.       B.  The patient doesn't awaken shortly after the seizure  C.  The patient has new problems such as difficulty seeing, speaking or moving  D.  The patient was injured during the seizure  E.  The patient has a temperature over 102 F (39C)  F.  The patient vomited and now is having trouble breathing

## 2018-11-29 NOTE — Progress Notes (Signed)
NEUROLOGY FOLLOW UP OFFICE NOTE  Chelsea Collins [id number] [birth year]  HISTORY OF PRESENT ILLNESS: I had the pleasure of seeing Chelsea Collins in follow-up in the neurology clinic on 11/29/2018. She is alone in the office today. Since her last visit, she delivered a healthy baby girl, Chelsea Collins, last 09/25/2018. During her pregnancy, oxcarbazepine dose was increased up to 900mg  BID, last level on 2/17 was 14. She started feeling dizzy post-partum and reduced dose back to prepregnancy dose of 300mg  BID and feels better. She denies any seizures or seizure-like symptoms since she was switched to oxcarbazepine in May 2018. She denies any staring/unresponsive episodes, gaps in time, olfactory/gustatory hallucinations, focal numbness/tingling/weakness, myoclonic jerks. She was reporting headaches on last visit, she states she is not having headaches. No further dizziness, no changes in vision. She had a fall last December when she tripped, no injuries. Sleep is good, her baby sleeps through the night. She is not breastfeeding.  History on Initial Assessment 01/24/2017: This is a pleasant 33 yo RH woman with a history of strep B meningitis in infancy with residual peripheral vision impairment, with  epilepsy. She was diagnosed with epilepsy in childhood when she was having staring spells. She reports that seizures stopped at age 67 and she was taken off seizure medication at age 51. She denies any myoclonic jerks or convulsions. In 2012, she started having episodes where her left leg would become weak for a minute or so, sometimes it felt like her left arm was also affected. She had an MRI brain in [place] reported as showing previous injury to the occipital lobe. She saw a neurologist at that time with concern for MS, she was reassured there was no evidence of MS. In 2014, she was in a classroom and had an episode where she apparently walked toward the wall and stood there just staring and unresponsive. She was amnestic of  the event until her supervisor called her to the office and told her what happened. She recalls another episode that same semester, she was stressed and waiting in the lobby, then did not know how she got there. She had an MRI brain without contrast which did not show any acute changes, there was note of ulceration in the calcarine cortex of both hemispheres, left greater than right, increased FLAIR signal going into the deep while matter with decreased signal centrally. Wake and drowsy EEG was normal. When she left school, she reports episodes were not as bad, she was still having difficulties with her left side and started having troubles with anxiety as well. She would be at a store and would not remember a word or a color. 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 She had to sit on the floor and was pale. When she moved to [place], she started having episodes of feeling hot, nauseated, weird, pale, as well as zoning out ("walking into traffic"). She saw neurologist Dr. Terrace Arabia and had a repeat EEG which was abnormal, during hyperventilation which was performed twice, there were intermittent short bursts of generalized spike sharp wave, indicating a generalized epilepsy disorder. She was started on Lamotrigine but 5 weeks later had a severe allergic reaction with swollen lymph nodes, night sweats, then rash. She was switched to Keppra and had a more severe immediate reaction within 1-2 days  with rash and facial swelling. She was then switched to Trileptal which she tolerated well, and reports that all the symptoms she was having went away. She last saw Dr. Terrace Arabia in February to discuss Trileptal and contraceptives, and made the decision to switch to Vimpat. She presents today asking to be switched back to Trileptal, stating that the  Vimpat was not controlling her symptoms. She would have episodes of turning [place], nausea, vision change, staring off into space unresponsive. She was also having headaches with vomiting. Her psychiatrist was concerned that Remeron was also contributing to symptoms, but she had been taking Remeron with Trileptal without these symptoms.   She also reports episodes where she would have a sudden speech impediment lasting 30 seconds, where she knows what she wants to say but cannot get the words out or form the words. Twice in the past 2 years she has felt a sensation of fear prior to the speech difficulties. She denies any olfactory/gustatory hallucinations, the left-sided symptoms have not recurred since 2014, no myoclonic jerks. She has had headaches every other day when she is late to take her Vimpat dose, resolving after she takes the medication. Pain is around her eyes with some photosensitivity, no nausea/vomiting. She denies any diplopia, dizziness, neck/back pain, bowel/bladder dysfunction.  Epilepsy Risk Factors:  Bacterial meningitis in infancy with encephalomalacia in the bilateral occipital lobes. Her maternal cousin had seizures in childhood, a maternal uncle had 2 seizures felt to be related to glucose levels. Otherwise she had a normal birth and early development.  There is no history of febrile convulsions, significant traumatic brain injury, neurosurgical procedures.  Prior AEDs: Keppra, Lamotrigine, Trileptal  PAST MEDICAL HISTORY: Past Medical History:  Diagnosis Date  . Anxiety disorder   . Encephalomalacia 03/21/2018   Occipital bilateral  . GAD (generalized anxiety disorder) 03/21/2018  . Heat intolerance   . Hypoglycemia   . Meningitis   . Seizures (HCC)    as an infant    MEDICATIONS: Current Outpatient Medications on File Prior to Visit  Medication Sig Dispense Refill  . busPIRone (BUSPAR) 10 MG tablet Take 2 tablets (20 mg total) by mouth 2 (two) times daily. 360 tablet  3  . hydrocortisone cream 1 % Apply 1 application topically 2 (two) times daily. 30 g 0  . Oxcarbazepine (TRILEPTAL) 300 MG tablet Take (2) Tablets in AM and (3) Tables in PM for 1 week, then increase to (3) Tablets twice daily (Patient taking 1 tab BID) 180 tablet 3   No current facility-administered medications on file prior to visit.     ALLERGIES: Allergies  Allergen Reactions  . Keppra [Levetiracetam] Swelling and Rash  . Lamictal [Lamotrigine] Rash    FAMILY HISTORY: Family History  Problem Relation Age of Onset  . Depression Mother   . Miscarriages / [place] Mother   . Hypertension Maternal Grandfather   . Heart disease Maternal Grandfather   . Heart attack Maternal Grandfather   . Intellectual disability Brother   . Bipolar disorder Brother   . Autism Brother   . Diabetes Maternal Grandmother   . Cancer Maternal Grandmother     SOCIAL HISTORY: Social History   Socioeconomic History  . Marital status: Married    Spouse name: Not on file  . Number of children: 0  . Years of education: 51  . Highest education level: Not on file  Occupational History    Comment: [employer]  Social Needs  . Financial resource strain: Not hard at all  .  Food insecurity:    Worry: Never true    Inability: Never true  . Transportation needs:    Medical: No    Non-medical: Not on file  Tobacco Use  . Smoking status: Never Smoker  . Smokeless tobacco: Never Used  Substance and Sexual Activity  . Alcohol use: No    Alcohol/week: 0.0 standard drinks  . Drug use: No  . Sexual activity: Yes    Comment: currently pregnant   Lifestyle  . Physical activity:    Days per week: Not on file    Minutes per session: Not on file  . Stress: Not at all  Relationships  . Social connections:    Talks on phone: Not on file    Gets together: Not on file    Attends religious service: Not on file    Active member of club or organization: Not on file    Attends meetings of clubs or  organizations: Not on file    Relationship status: Not on file  . Intimate partner violence:    Fear of current or ex partner: No    Emotionally abused: No    Physically abused: No    Forced sexual activity: No  Other Topics Concern  . Not on file  Social History Narrative   Lives with two roommates.     Right-handed.   Two 12oz cans of soda daily.    REVIEW OF SYSTEMS: Constitutional: No fevers, chills, or sweats, no generalized fatigue, change in appetite Eyes: No visual changes, double vision, eye pain Ear, nose and throat: No hearing loss, ear pain, nasal congestion, sore throat Cardiovascular: No chest pain, palpitations Respiratory:  No shortness of breath at rest or with exertion, wheezes GastrointestinaI: No nausea, vomiting, diarrhea, abdominal pain, fecal incontinence Genitourinary:  No dysuria, urinary retention or frequency Musculoskeletal:  No neck pain, back pain Integumentary: No rash, pruritus, skin lesions Neurological: as above Psychiatric: No depression, insomnia, anxiety Endocrine: No palpitations, fatigue, diaphoresis, mood swings, change in appetite, change in weight, increased thirst Hematologic/Lymphatic:  No anemia, purpura, petechiae. Allergic/Immunologic: no itchy/runny eyes, nasal congestion, recent allergic reactions, rashes  PHYSICAL EXAM: Vitals:   11/29/18 0844  BP: 98/60  Pulse: 71  SpO2: 98%   General: No acute distress Head:  Normocephalic/atraumatic Neck: supple, no paraspinal tenderness, full range of motion Back: No paraspinal tenderness Heart: regular rate and rhythm Lungs: Clear to auscultation bilaterally. Vascular: No carotid bruits. Skin/Extremities: No rash, no edema Neurological Exam: Mental status: alert and oriented to person, place, and time, no dysarthria or aphasia, Fund of knowledge is appropriate.  Recent and remote memory are intact. Attention and concentration are normal.    Able to name objects and repeat phrases.  Cranial nerves: CN I: not tested CN II: pupils equal, round and reactive to light, peripheral field deficits bilaterally, she sees movement in the superior quadrants, unable to see in the inferior quadrants (similar to prior).  CN III, IV, VI:  Limited left eye abduction with left esotropia, no nystagmus, no ptosis CN V: facial sensation intact CN VII: upper and lower face symmetric CN VIII: hearing intact to conversation Bulk & Tone: normal, no fasciculations. Motor: 5/5 throughout with no pronator drift. Sensation: intact to light touch.  No extinction to double simultaneous stimulation.  Romberg test negative Cerebellar: no incoordination on finger to nose testing Gait: narrow-based and steady, able to tandem walk adequately. Tremor: none  IMPRESSION: This is a pleasant 33 yo RH woman with a history of  bacterial meningitis in infancy with bilateral occipital encephalomalacia, seizures in childhood with staring spells that initially stopped at age 72 but recurred in 2014. She also started having recurrent episodes of left-sided weakness in 2014, denies any further similar symptoms since then. She reports recurrent episodes where she becomes pale, nauseated, and has had staring spells that stopped after Trileptal, but recurred with switch to Vimpat. EEG showed generalized discharges during hyperventilation suggestive of a generalized epilepsy. She continues to do well with no seizures or seizure-like symptoms since May 2018, she is back to pre-pregnancy oxcarbazepine dose of  BID with no side effects. She does not drive. She will follow-up in 8 months and knows to call for any changes.   Thank you for allowing me to participate in her care.  Please do not hesitate to call for any questions or concerns.  The duration of this appointment visit was 20 minutes of face-to-face time with the patient.  Greater than 50% of this time was spent in counseling, explanation of diagnosis, planning of  further management, and coordination of care.   Patrcia Dolly, M.D.   CC: Dr. Mardelle Matte

## 2018-11-30 NOTE — Telephone Encounter (Signed)
Pt went to [facility] today to see Dr. Tivis Ringer for OT for Pelvic Floor / the original provider is on maternity leave. Pt needs a referral put in for this TODAY due to advising urology provider of referral placed by Dr. Awilda Metro advise

## 2018-11-30 NOTE — Telephone Encounter (Signed)
Per Marylene Land, she is switching referral over to [facility] for patient.

## 2018-12-06 MED FILL — busPIRone HCL 10 MG TABS: 10 | 90 days supply | Qty: 360 | Fill #2

## 2018-12-06 MED FILL — SERTRALINE HCL 50 MG TABLET: 50 | 30 days supply | Qty: 30 | Fill #0

## 2018-12-07 ENCOUNTER — Encounter: Payer: Self-pay | Admitting: Physical Therapy

## 2018-12-07 ENCOUNTER — Ambulatory Visit
Payer: No Typology Code available for payment source | Attending: Obstetrics and Gynecology | Admitting: Physical Therapy

## 2018-12-07 ENCOUNTER — Other Ambulatory Visit: Payer: Self-pay

## 2018-12-07 DIAGNOSIS — M62838 Other muscle spasm: Secondary | ICD-10-CM | POA: Insufficient documentation

## 2018-12-07 DIAGNOSIS — M6281 Muscle weakness (generalized): Secondary | ICD-10-CM | POA: Diagnosis not present

## 2018-12-07 NOTE — Therapy (Signed)
[facility] [address], [address] [place], [place], [address] Phone: [phone]   Fax:  [phone]  Physical Therapy Evaluation  Patient Details  Name: Chelsea Collins MRN: [id number] Date of Birth: October 08, 1985 Referring Provider (PT): Dr. Alvino Chapel   Encounter Date: 12/07/2018  PT End of Session - 12/07/18 1255    Visit Number  1    Date for PT Re-Evaluation  04/08/19    Authorization Type  [employer]    PT Start Time  1145    PT Stop Time  1255    PT Time Calculation (min)  70 min    Activity Tolerance  Patient tolerated treatment well;No increased pain    Behavior During Therapy  WFL for tasks assessed/performed       Past Medical History:  Diagnosis Date  . Anxiety disorder   . Encephalomalacia 03/21/2018   Occipital bilateral  . GAD (generalized anxiety disorder) 03/21/2018  . Heat intolerance   . Hypoglycemia   . Meningitis   . Seizures (HCC)    as an infant    Past Surgical History:  Procedure Laterality Date  . EYE SURGERY    . TYMPANOSTOMY TUBE PLACEMENT      There were no vitals filed for this visit.   Subjective Assessment - 12/07/18 1152    Subjective  Patient had a baby on 09/25/2018. This patients first child. Patient was induced patient on 09/25/2018. Patient had a baby girl. Patient had a 3rd degree tear.  Had a vacuum assist birth. Patietn reports urinary leakage with sneezing. Patient  saw someone else and said there is an issue with the anus. I cannot have intercourse. Patient has issues with penetration. Husband was not able to place penis inside the vaginal due to granulation tissue.  Afterwards patient had some bleeding with intercourse. Patient is able to have penis in the vaginal canal with pain at entrance and deeper. Patient unable to squeeze the anus.     Patient is accompained by:  Family member   husband   Patient Stated Goals  be able to have intercourse;     Currently in Pain?   Yes    Pain Score  8     Pain Location  Vagina    Pain Orientation  Mid    Pain Type  Acute pain    Pain Onset  More than a month ago    Pain Frequency  Intermittent    Aggravating Factors   penis penetration; stabbing and walking    Pain Relieving Factors  no penile penetration    Multiple Pain Sites  No         [facility] PT Assessment - 12/07/18 0001      Assessment   Medical Diagnosis  N94.10 Unspecified dyspareuni    Referring Provider (PT)  Dr. Alvino Chapel    Onset Date/Surgical Date  09/25/18    Prior Therapy  yes      Precautions   Precautions  None      Restrictions   Weight Bearing Restrictions  No      Balance Screen   Has the patient fallen in the past 6 months  Yes    How many times?  1   due to medical issues   Has the patient had a decrease in activity level because of a fear of falling?   No    Is the patient reluctant to leave their home because of a fear of  falling?   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    Pelvic Floor Special Questions - 12/07/18 0001    Prior Pregnancies  Yes    Number of Pregnancies  1    Number of Vaginal Deliveries  1    Any difficulty with labor and  deliveries  Yes   third degree tear   Diastasis Recti  2 fingers width above umbilicus; 1 finger width below umbilicus    Currently Sexually Active  Yes    Is this Painful  Yes    Marinoff Scale  pain prevents any attempts at intercourse    Urinary Leakage  Yes    How often  1 time sneezinf    Urinary urgency  No    Skin Integrity  Irritaion present at   dryness, patient on her cycle   Scar  Tear;Painful;Restricted    Perineal Body/Introitus   Gaping    External Palpation  tenderness located on the left side of the perineal body    Pelvic Floor Internal Exam  Patient confirms identification and approves PT to assess the pelvic floor and treatment    Exam Type  Vaginal    Palpation  tenderness along the posterior introitus, bil. levator ani, along the obturator internist, scar is rough    Strength  weak squeeze, no lift    Tone  increased               PT Education - 12/07/18 1248    Education Details  information on lubricants and vaginal moisturizers; educated husband on how to massage the pelvic floor muscles    Person(s) Educated  Patient;Spouse    Methods  Explanation;Demonstration;Verbal cues;Handout    Comprehension  Returned demonstration;Verbalized understanding       PT Short Term Goals - 12/07/18 1305      PT SHORT TERM GOAL #1   Title  independent with initial HEP    Time  4    Period  Weeks    Status  New    Target Date  01/04/19      PT SHORT TERM GOAL #2   Title  husband understands how to perform soft tissue work to the perineum to soften the tissue    Time  4    Period  Weeks    Status  New    Target Date  01/04/19      PT SHORT TERM GOAL #3   Title  understand how to take care of the vaginal tissue    Time  4    Period  Weeks    Status  New    Target Date  01/04/19        PT Long Term Goals - 12/07/18 1307      PT LONG TERM GOAL #1   Title  independent with HEP and understand how to progress herself    Time  4    Period  Months     Target Date  04/08/19      PT LONG TERM GOAL #2   Title  abilty to have penile penetration with no to minimal pain due to improved tissue mobility    Time  4    Status  New    Target Date  04/08/19      PT LONG TERM GOAL #3   Title  pelvic floor strength >/= 3/5 holding for 10 seconds due to improve tissue mobility and no urinary leakage    Time  4    Period  Months    Status  New    Target Date  04/08/19      PT LONG TERM GOAL #4   Title  stand for 30 minutes with no pain in the vaginal area    Time  4    Period  Months    Status  New    Target Date  04/08/19             Plan - 12/07/18 1255    Clinical Impression Statement  Patient is a 33 year old female with dysparenuia since she had her daughter on 09/25/2018. Patient reports she had a 3rd degree tear and vacuum assisted birth. Patient had some granulation tissue removed along the perineal body. Patient husband was present during the evaluation. Patient reports she has difficulty with contracting her anus and vaginal canal when she was at another center to be treated. Patient pelvic floor strength is 2/5. Patient has difficulty with bulging the pelvic floor and winking the clitoris. Patient has scar tissue along the posterior introitus. Patient has tenderness located on the obturator internist and levator ani especially by the anus. Marinoff score is 3/3. Bilateral hip strength is 4/5. Diastasis Recti is 2 fingers width above the umbilicus and 1 finger width below the umbilicus. Patient husband was educated on how to perform internal soft tissue work. Patient will benefit from skilled therapy to improve tissue mobiity and pain to restore function.     Personal Factors and Comorbidities  Sex    Examination-Activity Limitations  Continence    Stability/Clinical Decision Making  Evolving/Moderate complexity    Clinical Decision Making  Low    Rehab Potential  Excellent    PT Frequency  1x / week    PT Duration  Other (comment)    4 months   PT Treatment/Interventions  Cryotherapy;Biofeedback;Moist Heat;Ultrasound;Therapeutic activities;Therapeutic exercise;Neuromuscular re-education;Manual techniques;Patient/family education;Scar mobilization;Dry needling    PT Next Visit Plan  soft tissue work vaginal canal, pelvic floor contraction, bulging the pelvic floor, lumbar stretches, hip stretches    Consulted and Agree with Plan of Care  Patient       Patient will benefit from skilled therapeutic intervention in order to improve the following deficits and impairments:  Pain, Increased fascial restricitons, Decreased coordination, Decreased mobility, Increased muscle spasms, Decreased activity tolerance,  Decreased endurance, Decreased strength  Visit Diagnosis: Muscle weakness (generalized) - Plan: PT plan of care cert/re-cert  Other muscle spasm - Plan: PT plan of care cert/re-cert     Problem List Patient Active Problem List   Diagnosis Date Noted  . Complex partial seizure (HCC) 03/21/2018  . Encephalomalacia 03/21/2018  . Anxiety disorder 03/21/2018  . Epilepsy (HCC) 01/30/2017  . Abnormal MRI of head 10/27/2015  . History of bacterial meningitis in infancy 10/27/2015    Eulis Foster, PT 12/07/18 1:12 PM   [facility] [address], [address] [place], [place], [address] Phone: [phone]   Fax:  [phone]  Name: Chelsea Collins MRN: [id number] Date of Birth: 07-22-1986

## 2018-12-07 NOTE — Patient Instructions (Signed)
Moisturizers . They are used in the vagina to hydrate the mucous membrane that make up the vaginal canal. . Designed to keep a more normal acid balance (ph) . Once placed in the vagina, it will last between two to three days.  . Use 2-3 times per week at bedtime and last longer than 60 min. . Ingredients to avoid is glycerin and fragrance, can increase chance of infection . Should not be used just before sex due to causing irritation . Most are gels administered either in a tampon-shaped applicator or as a vaginal suppository. They are non-hormonal.   Types of Moisturizers . Luvena- drug store . Vitamin E vaginal suppositories- Whole foods, Amazon . Moist Again . Coconut oil- can break down condoms . Julva- (Do no use if on Tamoxifen) amazon . Yes moisturizer- amazon . NeuEve Silk , NeuEve Silver for menopausal or over 65 (if have severe vaginal atrophy or cancer treatments use NeuEve Silk for  1 month than move to NeuEve Silver)- Amazon, Neuve.com . Olive and Bee intimate cream- www.oliveandbee.com.au . Mae vaginal moisturizer- Amazon  Creams to use externally on the Vulva area  Desert Harvest Releveum (good for for cancer patients that had radiation to the area)- amazon or www.desertharvest.com  V-magic cream - amazon  Julva-amazon  Vital "V Wild Yam salve ( help moisturize and help with thinning vulvar area, does have Beeswax  MoodMaid Botanical Pro-Meno Wild Yam Cream- Amazon  Desert Harvest Gele  Cleo by Damiva labial moisturizer (Amazon,    Things to avoid in the vaginal area . Do not use things to irritate the vulvar area . No lotions just specialized creams for the vulva area- Neogyn, V-magic, No soaps; can use Aveeno or Calendula cleanser if needed. Must be gentle . No deodorants . No douches . Good to sleep without underwear to let the vaginal area to air out . No scrubbing: spread the lips to let warm water rinse over labias and pat dry   Lubrication . Used  for intercourse to reduce friction . Avoid ones that have glycerin, warming gels, tingling gels, icing or cooling gel, scented . Avoid parabens due to a preservative similar to female sex hormone . May need to be reapplied once or several times during sexual activity . Can be applied to both partners genitals prior to vaginal penetration to minimize friction or irritation . Prevent irritation and mucosal tears that cause post coital pain and increased the risk of vaginal and urinary tract infections . Oil-based lubricants cannot be used with condoms due to breaking them down.  Least likely to irritate vaginal tissue.  . Plant based-lubes are safe . Silicone-based lubrication are thicker and last long and used for post-menopausal women  Vaginal Lubricators Here is a list of some suggested lubricators you can use for intercourse. Use the most hypoallergenic product.  You can place on you or your partner.   Slippery Stuff  Sylk or Sliquid Natural H2O ( good  if frequent UTI's)  Blossom Organics (www.blossom-organics.com)  Luvena   Coconut oil  PJur Woman Nude- water based lubricant, amazon  Uberlube- Amazon  Aloe Vera  Yes lubricant- Amazon  Wet Platinum-Silicone, Target, [facility]  Olive and Bee intimate cream-  www.oliveandbee.com.au  Pink - Amazon Things to avoid in lubricants are glycerin, warming gels, tingling gels, icing or cooling  gels, and scented gels.  Also avoid Vaseline. KY jelly, Replens, and Astroglide kills good bacteria(lactobacilli)  Things to avoid in the vaginal area . Do not use   things to irritate the vulvar area . No lotions- see below . No soaps; can use Aveeno or Calendula cleanser if needed. Must be gentle . No deodorants . No douches . Good to sleep without underwear to let the vaginal area to air out . No scrubbing: spread the lips to let warm water rinse over labias and pat dry  Creams that can be used on the Vulva Area  V  magic-amazon  Vital V Wild Yam Salve  Julva- Amazon  MoonMaid Botanical Pro-Meno Wild Yam Cream  Desert Havest Releveum or Desert Harvest Gele  [facility] [address], [address] [place], [place] [address] Phone # [phone] Fax [phone]     

## 2018-12-21 ENCOUNTER — Encounter: Payer: Self-pay | Admitting: Physical Therapy

## 2018-12-21 ENCOUNTER — Ambulatory Visit
Payer: No Typology Code available for payment source | Attending: Obstetrics and Gynecology | Admitting: Physical Therapy

## 2018-12-21 ENCOUNTER — Other Ambulatory Visit: Payer: Self-pay

## 2018-12-21 DIAGNOSIS — M6281 Muscle weakness (generalized): Secondary | ICD-10-CM | POA: Diagnosis present

## 2018-12-21 DIAGNOSIS — M62838 Other muscle spasm: Secondary | ICD-10-CM | POA: Diagnosis present

## 2018-12-21 NOTE — Patient Instructions (Addendum)
Pelvic Drops using PNF D2 Pattern   3 times per week or daily ; used to relax the pelvic floor    Pelvic Floor Release Stretches  FemFusion Fitness   FemFusion Fitness   do 3 times per week or do the handout from Med[employer] stretches  Feeling the pelvic floor muscle contract Place finger to 1 inch in the vagina on the side Feel for a bulge to come into your finger for 3 seconds Your goal is to finally feel a hug  around your finger  External Rotation: Hip - Knees Apart With Pelvic Floor (Hook-Lying)    Lie with hips and knees bent, band tied just above knees. Squeeze pelvic floor while pulling knees apart. Hold for __3_ seconds. Rest for _3__ seconds. Repeat _10__ times. Do _1__ times a day. Place a towel under buttocks and pull up towards your head As your knees go outward hug your pelvic floor  Copyright  VHI. All rights reserved.    Adduction: Hip - Knees Together With Pelvic Floor (Hook-Lying)    Lie with hips and knees bent, towel roll between knees. Squeeze pelvic floor while pushing knees together. Hold for __5_ seconds. Rest for _5__ seconds. Repeat _10__ times. Do _1__ times a day. Place a towel under buttocks and pull up towards your head As your knees go inward hug your pelvic floor  Copyright  VHI. All rights reserved.  We will schedule a conference call next week to see how you are doing.   [facility] [address] [place], [place] [address] Phone # [phone] Fax [phone] [email or web address]  Access Code: [email or web address]  URL: [email or web address]  Date: 12/21/2018  Prepared by: Eulis Foster   Exercises  Supine Pelvic Floor Stretch - 2 reps - 1 sets - 1 min hold - 1x daily - 7x weekly  Supine Piriformis Stretch with Leg Straight - 2 reps - 1 sets - 30 min hold - 1x daily - 7x weekly  Seated Hamstring Stretch - 2 reps - 1 sets - 30 sec hold - 1x daily - 7x weekly  Marjo Bicker Pose Side Bend - 10 reps - 1 sets  - 30sec hold - 1x daily - 7x weekly  Child's Pose Stretch - 2 reps - 1 sets - 30 sec hold - 1x daily - 7x weekly  Prone Press Up - 10 reps - 1 sets - 1x daily - 7x weekly

## 2018-12-21 NOTE — Therapy (Signed)
[facility] [address], [address] [place], [place], [address] Phone: [phone]   Fax:  [phone]  Physical Therapy Treatment  Patient Details  Name: Chelsea Collins MRN: [id number] Date of Birth: Mar 03, 1986 Referring Provider (PT): Dr. Alvino Chapel   Encounter Date: 12/21/2018  PT End of Session - 12/21/18 1509    Visit Number  2    Date for PT Re-Evaluation  04/08/19    Authorization Type  [employer]    PT Start Time  1500    PT Stop Time  1645    PT Time Calculation (min)  105 min    Activity Tolerance  Patient tolerated treatment well;No increased pain    Behavior During Therapy  WFL for tasks assessed/performed       Past Medical History:  Diagnosis Date  . Anxiety disorder   . Encephalomalacia 03/21/2018   Occipital bilateral  . GAD (generalized anxiety disorder) 03/21/2018  . Heat intolerance   . Hypoglycemia   . Meningitis   . Seizures (HCC)    as an infant    Past Surgical History:  Procedure Laterality Date  . EYE SURGERY    . TYMPANOSTOMY TUBE PLACEMENT      There were no vitals filed for this visit.  Subjective Assessment - 12/21/18 1501    Subjective  When my husband doing the massage to the pelvic floor I feel like I have to poop. I had a spasm at work. It was in the left back leg and inner thigh. I had trouble walking too. I had fecal incontinence1 week ago. I was at work standing at [employer] and felt something running out of my anus. It was liquid poop.     Patient Stated Goals  be able to have intercourse;     Currently in Pain?  Yes    Pain Score  8     Pain Location  Vagina    Pain Orientation  Mid    Pain Descriptors / Indicators  Discomfort    Pain Type  Acute pain    Pain Onset  More than a month ago    Pain Frequency  Intermittent    Aggravating Factors   penis penetration; stabbing and walking    Pain Relieving Factors  no penile penetration    Multiple Pain Sites  No                     Pelvic Floor Special Questions - 12/21/18 0001    Pelvic Floor Internal Exam  Patient confirms identification and approves PT to assess the pelvic floor and treatment    Exam Type  Vaginal    Strength  weak squeeze, no lift        [facility] Adult PT Treatment/Exercise - 12/21/18 0001      Self-Care   Self-Care  Other Self-Care Comments    Other Self-Care Comments   education of the pelvic floor with videos to see the muscle contract and how the bladder moves upward to help her understand what is happening with her muscles      Neuro Re-ed    Neuro Re-ed Details   with therapist finger in the vaginal canal facilitating the pelvic floor contraction with taping then have the patients finger in the canal to feel the contraction, using a mirror to bulge and contract the pelvic floor with tactile cues to the outside of the clitoral hood and labia minora to facilitate a contraction  Lumbar Exercises: Stretches   Active Hamstring Stretch  Right;Left;2 reps;30 seconds   sitting   Press Ups  10 reps    Quadruped Mid Back Stretch  3 reps;30 seconds   all three directions prone   Piriformis Stretch  Right;Left;1 rep;30 seconds   supine   Piriformis Stretch Limitations  a      Lumbar Exercises: Supine   Other Supine Lumbar Exercises  hookly with yellow band around knees with bringing knees outward and pelvic floor contraction; ball squeeze in hookly with pelvic floor contraction; used a towel for both exercises to faciiltated the pelvci floor contraction.       Manual Therapy   Manual Therapy  Internal Pelvic Floor;Soft tissue mobilization    Soft tissue mobilization  along the perineal body to realign the scar    Internal Pelvic Floor  along the urethra sphincter, bulbocavernosus, posterior fourchette, perineal scar from episiotomy, levator ani             PT Education - 12/21/18 1643    Education Details  Access Code: [email or web address] ; you tube video for  pelvic drop and hip stretches; pelvic floor strength with hip exericses    Person(s) Educated  Patient    Methods  Explanation;Demonstration;Verbal cues;Handout    Comprehension  Returned demonstration;Verbalized understanding       PT Short Term Goals - 12/21/18 1710      PT SHORT TERM GOAL #1   Title  independent with initial HEP    Time  4    Period  Weeks    Status  On-going      PT SHORT TERM GOAL #2   Title  husband understands how to perform soft tissue work to the perineum to soften the tissue    Time  4    Period  Weeks    Status  Achieved      PT SHORT TERM GOAL #3   Title  understand how to take care of the vaginal tissue    Time  4    Period  Weeks    Status  Achieved        PT Long Term Goals - 12/07/18 1307      PT LONG TERM GOAL #1   Title  independent with HEP and understand how to progress herself    Time  4    Period  Months    Target Date  04/08/19      PT LONG TERM GOAL #2   Title  abilty to have penile penetration with no to minimal pain due to improved tissue mobility    Time  4    Status  New    Target Date  04/08/19      PT LONG TERM GOAL #3   Title  pelvic floor strength >/= 3/5 holding for 10 seconds due to improve tissue mobility and no urinary leakage    Time  4    Period  Months    Status  New    Target Date  04/08/19      PT LONG TERM GOAL #4   Title  stand for 30 minutes with no pain in the vaginal area    Time  4    Period  Months    Status  New    Target Date  04/08/19            Plan - 12/21/18 1510    Clinical Impression Statement  Patient is having difficulty contracting her  pelvic floor and feeling the contraction. We used tactile cues, a mirror, and diaphgrams to assist her. She was able to feel the contraction with her finger in the vaginal canal. Patient cltoral hood has difficulty with nodding due to weakness of the muscles. Patient has thickness on the perineal body with tenderness due to the episiomity scar.  After soft tissue work the scar was pliable and less tender. Patient husband has performing soft tissue work to the pelvic floor muscles regularly. Patient perineal area looks healthy and no redness since she has been using the cream the doctor recommended. Patient will benefit from skilled therapy to improve tissue mobility and pain to restore function.     Personal Factors and Comorbidities  Sex    Examination-Activity Limitations  Continence    Stability/Clinical Decision Making  Evolving/Moderate complexity    Rehab Potential  Excellent    PT Frequency  1x / week    PT Duration  Other (comment)   4 months   PT Treatment/Interventions  Cryotherapy;Biofeedback;Moist Heat;Ultrasound;Therapeutic activities;Therapeutic exercise;Neuromuscular re-education;Manual techniques;Patient/family education;Scar mobilization;Dry needling    PT Next Visit Plan  call patient next week to guide her with pelvic floor contraction, abdominal bracing    PT Home Exercise Plan  Access Code: [email or web address]     Recommended Other Services  MD signed the initial eval    Consulted and Agree with Plan of Care  Patient       Patient will benefit from skilled therapeutic intervention in order to improve the following deficits and impairments:  Pain, Increased fascial restricitons, Decreased coordination, Decreased mobility, Increased muscle spasms, Decreased activity tolerance, Decreased endurance, Decreased strength  Visit Diagnosis: Muscle weakness (generalized)  Other muscle spasm     Problem List Patient Active Problem List   Diagnosis Date Noted  . Complex partial seizure (HCC) 03/21/2018  . Encephalomalacia 03/21/2018  . Anxiety disorder 03/21/2018  . Epilepsy (HCC) 01/30/2017  . Abnormal MRI of head 10/27/2015  . History of bacterial meningitis in infancy 10/27/2015    Eulis Foster, PT 12/21/18 5:11 PM   [facility] [address] [place], [place], [address] Phone: [phone]   Fax:  [phone]  Name: Chelsea Collins MRN: [id number] Date of Birth: 1986/06/13

## 2018-12-25 ENCOUNTER — Ambulatory Visit (INDEPENDENT_AMBULATORY_CARE_PROVIDER_SITE_OTHER): Payer: No Typology Code available for payment source | Admitting: Psychology

## 2018-12-25 DIAGNOSIS — F411 Generalized anxiety disorder: Secondary | ICD-10-CM | POA: Diagnosis not present

## 2018-12-25 DIAGNOSIS — F4321 Adjustment disorder with depressed mood: Secondary | ICD-10-CM

## 2019-01-02 ENCOUNTER — Encounter: Payer: Self-pay | Admitting: Physical Therapy

## 2019-01-04 ENCOUNTER — Encounter: Payer: Self-pay | Admitting: Physical Therapy

## 2019-01-05 MED FILL — buPROPion HCL ER (XL) 150 M: 150 | 30 days supply | Qty: 30 | Fill #0

## 2019-01-07 ENCOUNTER — Encounter: Payer: Self-pay | Admitting: Physical Therapy

## 2019-01-08 ENCOUNTER — Ambulatory Visit (INDEPENDENT_AMBULATORY_CARE_PROVIDER_SITE_OTHER): Payer: No Typology Code available for payment source | Admitting: Psychology

## 2019-01-08 DIAGNOSIS — F4321 Adjustment disorder with depressed mood: Secondary | ICD-10-CM | POA: Diagnosis not present

## 2019-01-08 DIAGNOSIS — F411 Generalized anxiety disorder: Secondary | ICD-10-CM | POA: Diagnosis not present

## 2019-01-09 ENCOUNTER — Encounter: Payer: Self-pay | Admitting: Physical Therapy

## 2019-01-09 ENCOUNTER — Ambulatory Visit: Payer: No Typology Code available for payment source | Admitting: Physical Therapy

## 2019-01-09 ENCOUNTER — Other Ambulatory Visit: Payer: Self-pay

## 2019-01-09 DIAGNOSIS — M6281 Muscle weakness (generalized): Secondary | ICD-10-CM

## 2019-01-09 DIAGNOSIS — M62838 Other muscle spasm: Secondary | ICD-10-CM

## 2019-01-09 NOTE — Therapy (Addendum)
[facility] [address], [address] [place], [place], [address] Phone: [phone]   Fax:  [phone]  Physical Therapy Treatment  Patient Details  Name: Chelsea Collins MRN: [id number] Date of Birth: 1986/05/29 Referring Provider (PT): Dr. Alvino Chapel   Encounter Date: 01/09/2019  PT End of Session - 01/09/19 1419    Visit Number  3    Date for PT Re-Evaluation  04/08/19    Authorization Type  [employer]    PT Start Time  1400    PT Stop Time  1451    PT Time Calculation (min)  51 min    Activity Tolerance  Patient tolerated treatment well;No increased pain    Behavior During Therapy  WFL for tasks assessed/performed       Past Medical History:  Diagnosis Date  . Anxiety disorder   . Encephalomalacia 03/21/2018   Occipital bilateral  . GAD (generalized anxiety disorder) 03/21/2018  . Heat intolerance   . Hypoglycemia   . Meningitis   . Seizures (HCC)    as an infant    Past Surgical History:  Procedure Laterality Date  . EYE SURGERY    . TYMPANOSTOMY TUBE PLACEMENT      There were no vitals filed for this visit.  Subjective Assessment - 01/09/19 1407    Subjective  I am unable to do the telehealth due to not able to load correctly. Sometimes I have trouble  getting to the bathroom with a bowel movement but got there. Patient reports less sensation in the clitorus. Patient reports her hip and left knee gave way. Patient is increasing her hours at work.     Patient Stated Goals  be able to have intercourse;     Currently in Pain?  No/denies    Multiple Pain Sites  No         [facility] PT Assessment - 01/09/19 0001      Palpation   SI assessment   left ilium is posteriorly rotated; sacrum rotated right                Pelvic Floor Special Questions - 01/09/19 0001    External Perineal Exam  tightness located on the superior transverse perineum, along the ischiocavernosus    Skin Integrity   Intact   no redness   Perineal Body/Introitus   Gaping    Pelvic Floor Internal Exam  Patient confirms identification and approves PT to assess the pelvic floor and treatment    Exam Type  Vaginal    Palpation  patient able to perform a anal contraction for 2 seconds    Strength  weak squeeze, no lift   during contraction no nod of the clitoral hood       [facility] Adult PT Treatment/Exercise - 01/09/19 0001      Neuro Re-ed    Neuro Re-ed Details   tactile cues to contract the vaginal canal, tactile cues to nod his clitorus, used 2 fingers in the vaginal canal to work on contraction, tried to use the hip adductors and gluteal to conract and overflow into the vaginal canal, used a mirror for visulation to contract the pelvic floor      Manual Therapy   Manual Therapy  Muscle Energy Technique    Soft tissue mobilization  release of perineal tissue, ischiocavernosus, bulbocavernosus and around the clitoral hood    Muscle Energy Technique  correct the left ilium;  PT Education - 01/09/19 1858    Education Details  gluteal squeeze with hip adductor contraction to overflow to contract the vaginal canal    Person(s) Educated  Patient    Methods  Explanation;Demonstration;Tactile cues;Verbal cues;Handout    Comprehension  Returned demonstration;Verbalized understanding       PT Short Term Goals - 01/09/19 1417      PT SHORT TERM GOAL #1   Title  independent with initial HEP    Time  4    Period  Weeks    Status  On-going        PT Long Term Goals - 01/09/19 1417      PT LONG TERM GOAL #1   Title  independent with HEP and understand how to progress herself    Time  4    Period  Months    Status  On-going      PT LONG TERM GOAL #2   Title  abilty to have penile penetration with no to minimal pain due to improved tissue mobility    Time  4    Period  Weeks    Status  Achieved      PT LONG TERM GOAL #3   Title  pelvic floor strength >/= 3/5 holding for 10  seconds due to improve tissue mobility and no urinary leakage    Time  4    Period  Months    Status  On-going      PT LONG TERM GOAL #4   Title  stand for 30 minutes with no pain in the vaginal area    Baseline  not sure    Time  4    Period  Months    Status  On-going            Plan - 01/09/19 1418    Clinical Impression Statement  Patient is having difficulty with vaginal contraction and to feel the contraction. Therapist has been using tactile cues and mirror to assist patient in contraction. Patient seems to have trouble connecting to the pelvic floor. Patient reports fecal and urinary leakage. Patient is able to have intercourse without pain. Patient has fascial restriction around the vaginal canal. Patient will benefit from skilled therapy to improve pelvic floor contraction, awareness of the pelvic floor, and reduce leakage.     Personal Factors and Comorbidities  Sex    Examination-Activity Limitations  Continence    Stability/Clinical Decision Making  Evolving/Moderate complexity    Rehab Potential  Excellent    PT Frequency  1x / week    PT Duration  Other (comment)   4 months   PT Treatment/Interventions  Cryotherapy;Biofeedback;Moist Heat;Ultrasound;Therapeutic activities;Therapeutic exercise;Neuromuscular re-education;Manual techniques;Patient/family education;Scar mobilization;Dry needling    PT Next Visit Plan  hip exercises, pelvic floor exercises at low level to work on contractions and sensory awareness    PT Home Exercise Plan  Access Code: [email or web address]     Consulted and Agree with Plan of Care  Patient       Patient will benefit from skilled therapeutic intervention in order to improve the following deficits and impairments:  Pain, Increased fascial restricitons, Decreased coordination, Decreased mobility, Increased muscle spasms, Decreased activity tolerance, Decreased endurance, Decreased strength  Visit Diagnosis: Muscle weakness (generalized)  Other  muscle spasm     Problem List Patient Active Problem List   Diagnosis Date Noted  . Complex partial seizure (HCC) 03/21/2018  . Encephalomalacia 03/21/2018  . Anxiety disorder 03/21/2018  . Epilepsy (HCC) 01/30/2017  .  Abnormal MRI of head 10/27/2015  . History of bacterial meningitis in infancy 10/27/2015    Eulis Fosterheryl Gray, PT 01/09/19 7:02 PM  Access Code: [email or web address]  URL: [email or web address]  Date: 01/11/2019  Prepared by: Eulis Fosterheryl Gray   Exercises  Seated Piriformis Stretch with Trunk Bend - 2 reps - 1 sets - 30 sec hold - 1x daily - 7x weekly  Seated Hamstring Stretch - 2 reps - 1 sets - 30 sec hold - 1x daily - 7x weekly  Quadriceps Stretch with Chair - 2 reps - 1 sets - 30 sec hold - 1x daily - 7x weekly  Supine Bridge - 10 reps - 1 sets - 1x daily - 7x weekly  Prone Alternating Arm and Leg Lifts - 10 reps - 1 sets - 1 sec hold - 1x daily - 7x weekly  Clamshell - 10 reps - 1 sets - 1x daily - 7x weekly  Talked to patient on 01/11/2019 about electrical stimulation to the vaginal muscles to improve contraction and added exercises for her sore hips.  Eulis FosterCheryl Gray, PT 01/11/19 3:03 PM     [facility] [address] [place], [place], [address] Phone: [phone]   Fax:  [phone]  Name: Chelsea Collins MRN: [id number] Date of Birth: 1986-09-01

## 2019-01-09 NOTE — Patient Instructions (Signed)
Access Code: [email or web address]  URL: [email or web address]  Date: 01/09/2019  Prepared by: Eulis Foster   Exercises  Supine Pelvic Floor Stretch - 2 reps - 1 sets - 1 min hold - 1x daily - 7x weekly  Supine Piriformis Stretch with Leg Straight - 2 reps - 1 sets - 30 sec hold - 1x daily - 7x weekly  Seated Hamstring Stretch - 2 reps - 1 sets - 30 sec hold - 1x daily - 7x weekly  Marjo Bicker Pose Side Bend - 10 reps - 1 sets - 30sec hold - 1x daily - 7x weekly  Child's Pose Stretch - 2 reps - 1 sets - 30 sec hold - 1x daily - 7x weekly  Prone Press Up - 10 reps - 1 sets - 1x daily - 7x weekly  Gluteal Squeeze    Squeeze buttocks muscles as tightly as possible while counting out loud to __5 seconds__. Repeat _10___ times. Do ___5_ sessions per day.  http://gt2.exer.us/364   Copyright  VHI. All rights reserved.   External Rotation: Hip - Knees Apart With Pelvic Floor (Hook-Lying)    Lie with hips and knees bent, band tied just above knees. Squeeze pelvic floor while pulling knees apart. Hold for __3_ seconds. Rest for _3__ seconds. Repeat _10__ times. Do _1__ times a day. Place a towel under buttocks and pull up towards your head As your knees go outward hug your pelvic floor  Copyright  VHI. All rights reserved.

## 2019-01-10 ENCOUNTER — Encounter: Payer: Self-pay | Admitting: Physical Therapy

## 2019-01-11 ENCOUNTER — Telehealth: Payer: Self-pay | Admitting: Physical Therapy

## 2019-01-11 NOTE — Telephone Encounter (Signed)
Contacted patient and discussed about electrical stim and exercises. Chelsea Collins, PT @4 /23/2020@ 2:58 PM

## 2019-01-15 ENCOUNTER — Encounter: Payer: Self-pay | Admitting: Physical Therapy

## 2019-01-16 ENCOUNTER — Ambulatory Visit: Payer: No Typology Code available for payment source | Admitting: Physical Therapy

## 2019-01-16 ENCOUNTER — Other Ambulatory Visit: Payer: Self-pay

## 2019-01-16 ENCOUNTER — Encounter: Payer: Self-pay | Admitting: Physical Therapy

## 2019-01-16 DIAGNOSIS — M6281 Muscle weakness (generalized): Secondary | ICD-10-CM

## 2019-01-16 DIAGNOSIS — M62838 Other muscle spasm: Secondary | ICD-10-CM

## 2019-01-16 NOTE — Therapy (Addendum)
[facility] [address], [place], [place], [address] Phone: [phone]   Fax:  [phone]  Physical Therapy Treatment  Patient Details  Name: Chelsea Collins MRN: [id number] Date of Birth: 1986/06/27 Referring Provider (PT): Dr. Leodis Rains   Encounter Date: 01/16/2019  PT End of Session - 01/16/19 1406    Visit Number  4    Date for PT Re-Evaluation  04/08/19    Authorization Type  [employer]    PT Start Time  1400    PT Stop Time  1445    PT Time Calculation (min)  45 min    Activity Tolerance  Patient tolerated treatment well;No increased pain    Behavior During Therapy  WFL for tasks assessed/performed       Past Medical History:  Diagnosis Date  . Anxiety disorder   . Encephalomalacia 03/21/2018   Occipital bilateral  . GAD (generalized anxiety disorder) 03/21/2018  . Heat intolerance   . Hypoglycemia   . Meningitis   . Seizures ([place])    as an infant    Past Surgical History:  Procedure Laterality Date  . EYE SURGERY    . TYMPANOSTOMY TUBE PLACEMENT      There were no vitals filed for this visit.  Subjective Assessment - 01/16/19 1402    Subjective  I got the stimulation unit and probe. I am having pain with intercourse. The stretches are helping.     Patient Stated Goals  be able to have intercourse;     Currently in Pain?  Yes    Pain Score  3     Pain Location  Vagina    Pain Orientation  Mid    Pain Descriptors / Indicators  Dull;Aching    Pain Onset  More than a month ago    Pain Frequency  Intermittent    Aggravating Factors   penis penetration    Pain Relieving Factors  no penile penetration                       [facility] Adult PT Treatment/Exercise - 01/16/19 0001      Self-Care   Self-Care  Other Self-Care Comments    Other Self-Care Comments   instruction on contraindications and precautions of the home EMS unit, how to set a time, how to increase the  intensity, how to connect the probe into the vaginal canal.       Modalities   Modalities  Electrical Stimulation      Electrical Stimulation   Electrical Stimulation Location  vaginal canal with probe  in supine position    Electrical Stimulation Action  EMS protocol 15; contract the the pelvic floor muscles when the stim is on    Electrical Stimulation Parameters  50 hz on 5 seconds for 10 min; up to 13 level    Electrical Stimulation Goals  Strength;Tone;Neuromuscular facilitation             PT Education - 01/16/19 1448    Education Details  how to use the EMS unit, precautions, contraindications, how to attach the probe to the lead wires, how to increase the intensity    Person(s) Educated  Patient    Methods  Explanation;Demonstration;Verbal cues;Handout    Comprehension  Returned demonstration;Verbalized understanding       PT Short Term Goals - 01/16/19 1405      PT SHORT TERM GOAL #1   Title  independent with initial HEP  Time  4    Period  Weeks    Status  Achieved      PT SHORT TERM GOAL #2   Title  husband understands how to perform soft tissue work to the perineum to soften the tissue    Time  4    Period  Weeks    Status  Achieved      PT SHORT TERM GOAL #3   Title  understand how to take care of the vaginal tissue    Time  4    Period  Weeks    Status  Achieved        PT Long Term Goals - 01/09/19 1417      PT LONG TERM GOAL #1   Title  independent with HEP and understand how to progress herself    Time  4    Period  Months    Status  On-going      PT LONG TERM GOAL #2   Title  abilty to have penile penetration with no to minimal pain due to improved tissue mobility    Time  4    Period  Weeks    Status  Achieved      PT LONG TERM GOAL #3   Title  pelvic floor strength >/= 3/5 holding for 10 seconds due to improve tissue mobility and no urinary leakage    Time  4    Period  Months    Status  On-going      PT LONG TERM GOAL #4    Title  stand for 30 minutes with no pain in the vaginal area    Baseline  not sure    Time  4    Period  Months    Status  On-going            Plan - 01/16/19 1407    Clinical Impression Statement  Patient has learned how to use the pelvic floor EMS for contraction of the pelvic floor muscles and re-education. Patient is able to feel the muscles contract around the probe. This will help patient increase her feeling in the area and strength. Patient is doing her hip exercise and has decreased her pain. Patient is having 3/10 pain in the vaginal area with penile penetration. Patient will benefit from skilled therapy to improve pelvic floor contraction , awareness of the pelvic floor, and reduce leakage.     Personal Factors and Comorbidities  Sex    Examination-Activity Limitations  Continence    Stability/Clinical Decision Making  Evolving/Moderate complexity    Rehab Potential  Excellent    PT Frequency  1x / week    PT Duration  Other (comment)   4 months   PT Treatment/Interventions  Cryotherapy;Biofeedback;Moist Heat;Ultrasound;Therapeutic activities;Therapeutic exercise;Neuromuscular re-education;Manual techniques;Patient/family education;Scar mobilization;Dry needling    PT Next Visit Plan  pelvic floor exercises at low level to work on contractions and sensory awareness ; contraction in quadruped, pull sensor up    PT Home Exercise Plan  Access Code: [email or web address]     Consulted and Agree with Plan of Care  Patient       Patient will benefit from skilled therapeutic intervention in order to improve the following deficits and impairments:  Pain, Increased fascial restricitons, Decreased coordination, Decreased mobility, Increased muscle spasms, Decreased activity tolerance, Decreased endurance, Decreased strength  Visit Diagnosis: Muscle weakness (generalized)  Other muscle spasm     Problem List Patient Active Problem List   Diagnosis Date Noted  .  Complex partial seizure  ([place]) 03/21/2018  . Encephalomalacia 03/21/2018  . Anxiety disorder 03/21/2018  . Epilepsy ([place]) 01/30/2017  . Abnormal MRI of head 10/27/2015  . History of bacterial meningitis in infancy 10/27/2015    Earlie Counts, PT 01/16/19 2:59 PM   [facility] [address], [place], [place], [address] Phone: [phone]   Fax:  [phone]  Name: Chelsea Collins MRN: [id number] Date of Birth: 06/17/1986  PHYSICAL THERAPY DISCHARGE SUMMARY  Visits from Start of Care: 4  Current functional level related to goals / functional outcomes: See above. Patient called to be discharged from physical therapy. Patient reports she is going to another facility for physical therapy.   Remaining deficits: See above.    Education / Equipment: HEP, vaginal electrical stimulation Plan: Patient agrees to discharge.  Patient goals were not met. Patient is being discharged due to the patient's request.  Thank you for the referral. Earlie Counts, PT 01/25/19 4:44 PM  ?????

## 2019-01-16 NOTE — Patient Instructions (Signed)
Use the electrical stimulation daily for 30 minutes Contract the pelvic floor while the stimulation is on and facilitating the muscles.  Clean the probe prior to inserting into the vagina P15 is the protocol Increase the intensity until you feel the pelvic floor muscles contract, may feel an ache at the same time [facility] [address] [place], [place] [address] Phone # [phone] Fax [phone]

## 2019-01-18 ENCOUNTER — Encounter: Payer: Self-pay | Admitting: Physical Therapy

## 2019-01-19 ENCOUNTER — Encounter: Payer: Self-pay | Admitting: Physical Therapy

## 2019-01-20 ENCOUNTER — Encounter: Payer: Self-pay | Admitting: Physical Therapy

## 2019-01-22 ENCOUNTER — Encounter: Payer: Self-pay | Admitting: Physical Therapy

## 2019-01-22 ENCOUNTER — Ambulatory Visit (INDEPENDENT_AMBULATORY_CARE_PROVIDER_SITE_OTHER): Payer: No Typology Code available for payment source | Admitting: Psychology

## 2019-01-22 DIAGNOSIS — F4321 Adjustment disorder with depressed mood: Secondary | ICD-10-CM

## 2019-01-22 DIAGNOSIS — F411 Generalized anxiety disorder: Secondary | ICD-10-CM

## 2019-01-23 ENCOUNTER — Encounter: Payer: Self-pay | Admitting: Physical Therapy

## 2019-01-24 ENCOUNTER — Encounter: Payer: Self-pay | Admitting: Physical Therapy

## 2019-01-25 ENCOUNTER — Ambulatory Visit: Payer: No Typology Code available for payment source | Admitting: Physical Therapy

## 2019-01-30 ENCOUNTER — Encounter: Payer: Self-pay | Admitting: Physical Therapy

## 2019-02-01 ENCOUNTER — Encounter: Payer: Self-pay | Admitting: Physical Therapy

## 2019-02-02 ENCOUNTER — Encounter: Payer: No Typology Code available for payment source | Admitting: Physical Therapy

## 2019-02-05 ENCOUNTER — Ambulatory Visit (INDEPENDENT_AMBULATORY_CARE_PROVIDER_SITE_OTHER): Payer: No Typology Code available for payment source | Admitting: Psychology

## 2019-02-05 DIAGNOSIS — F411 Generalized anxiety disorder: Secondary | ICD-10-CM

## 2019-02-05 DIAGNOSIS — F4321 Adjustment disorder with depressed mood: Secondary | ICD-10-CM | POA: Diagnosis not present

## 2019-02-06 ENCOUNTER — Encounter: Payer: Self-pay | Admitting: Physical Therapy

## 2019-02-13 ENCOUNTER — Encounter: Payer: Self-pay | Admitting: Physical Therapy

## 2019-02-20 ENCOUNTER — Ambulatory Visit (INDEPENDENT_AMBULATORY_CARE_PROVIDER_SITE_OTHER): Payer: No Typology Code available for payment source | Admitting: Psychology

## 2019-02-20 DIAGNOSIS — F4321 Adjustment disorder with depressed mood: Secondary | ICD-10-CM | POA: Diagnosis not present

## 2019-02-20 DIAGNOSIS — F411 Generalized anxiety disorder: Secondary | ICD-10-CM | POA: Diagnosis not present

## 2019-02-20 MED FILL — diazePAM 5 MG TABS: 5 | 3 days supply | Qty: 15 | Fill #0

## 2019-02-20 MED FILL — buPROPion HCL ER (XL) 150 M: 150 | 30 days supply | Qty: 30 | Fill #1

## 2019-02-26 ENCOUNTER — Telehealth: Payer: Self-pay | Admitting: Family Medicine

## 2019-02-26 DIAGNOSIS — N8189 Other female genital prolapse: Secondary | ICD-10-CM

## 2019-02-26 NOTE — Telephone Encounter (Signed)
Copied from [place] [id number]. Topic: Quick Communication - See Telephone Encounter >> Feb 26, 2019  3:44 PM Blase Mess A wrote: CRM for notification. See Telephone encounter for: 02/26/19.Has patient seen PCP for this complaint? Yes  *If NO, is insurance requiring patient see PCP for this issue before PCP can refer them? Yes  Referral for which specialty: Pelvic Floor PT  Preferred provider/office: Jerl Mina  PT D[facility]  [place] [address] [phone] Reason for referral: Pelvic Floor PT- Needs to be placed with the Focus Plan.

## 2019-02-26 NOTE — Telephone Encounter (Signed)
Copied from [place] [phone]. Topic: Referral - Request for Referral >> Feb 26, 2019  3:37 PM Blase Mess A wrote: Has patient seen PCP for this complaint? Yes  *If NO, is insurance requiring patient see PCP for this issue before PCP can refer them? Yes  Referral for which specialty: Pelvic Floor PT  Preferred provider/office: Jerl Mina  PT DTT [facility]  [facility] [place] [id number] [phone] Reason for referral: Pelvic Floor PT- Needs to be placed with the Focus Plan.

## 2019-02-26 NOTE — Telephone Encounter (Signed)
See note

## 2019-02-27 ENCOUNTER — Encounter: Payer: Self-pay | Admitting: *Deleted

## 2019-02-27 MED FILL — CYCLOBENZAPRINE HCL 10 MG T: 10 | 10 days supply | Qty: 20 | Fill #0

## 2019-02-27 MED FILL — oxyCODONE HCL 5 MG TABS: 5 | 2 days supply | Qty: 15 | Fill #0

## 2019-02-27 NOTE — Telephone Encounter (Signed)
Pt aware referral made

## 2019-02-27 NOTE — Telephone Encounter (Signed)
Can this referral be made? 

## 2019-02-27 NOTE — Telephone Encounter (Signed)
Yes, gyn had sent her originally but may need [place] to do it now for Focus plan. Thanks.

## 2019-03-01 ENCOUNTER — Encounter: Payer: No Typology Code available for payment source | Admitting: Family Medicine

## 2019-03-01 ENCOUNTER — Ambulatory Visit (INDEPENDENT_AMBULATORY_CARE_PROVIDER_SITE_OTHER): Payer: No Typology Code available for payment source | Admitting: Family Medicine

## 2019-03-01 ENCOUNTER — Other Ambulatory Visit: Payer: Self-pay | Admitting: *Deleted

## 2019-03-01 ENCOUNTER — Encounter: Payer: Self-pay | Admitting: Family Medicine

## 2019-03-01 ENCOUNTER — Other Ambulatory Visit: Payer: Self-pay

## 2019-03-01 ENCOUNTER — Other Ambulatory Visit: Payer: No Typology Code available for payment source

## 2019-03-01 VITALS — BP 98/60 | HR 76 | Temp 98.0°F | Resp 14 | Ht 63.0 in | Wt 97.0 lb

## 2019-03-01 DIAGNOSIS — O99345 Other mental disorders complicating the puerperium: Secondary | ICD-10-CM | POA: Diagnosis not present

## 2019-03-01 DIAGNOSIS — Z0000 Encounter for general adult medical examination without abnormal findings: Secondary | ICD-10-CM | POA: Diagnosis not present

## 2019-03-01 DIAGNOSIS — G40909 Epilepsy, unspecified, not intractable, without status epilepticus: Secondary | ICD-10-CM

## 2019-03-01 DIAGNOSIS — M6289 Other specified disorders of muscle: Secondary | ICD-10-CM | POA: Diagnosis not present

## 2019-03-01 DIAGNOSIS — F53 Postpartum depression: Secondary | ICD-10-CM

## 2019-03-01 HISTORY — DX: Postpartum depression: F53.0

## 2019-03-01 HISTORY — DX: Other mental disorders complicating the puerperium: O99.345

## 2019-03-01 NOTE — Progress Notes (Signed)
Subjective  Chief Complaint  Patient presents with  . Annual Exam    Dr. Marvel Plan is requesting pt to have urine culture    HPI: Chelsea Collins is a 33 y.o. female who presents to [facility] [place] today for a Female Wellness Visit.  She also has the concerns and/or needs as listed above in the chief complaint. These will be addressed in addition to the Health Maintenance Visit.   Wellness Visit: annual visit with health maintenance review and exam without Pap   33 yo 5 month postpartum pt with h/o 3rd degree tear and postpartum depression who is dealing with pelvic floor dysfunction manged by gyn. Having new urinary sxs: gyn would like UTI ruled out. She is going to PT as well. Coping ok but had to change from zoloft due to sexual side effects to wellbutrin. She is also going to see a psychiatrist to help sort out functional issues/mood issues.   Chronic disease management visit and/or acute problem visit:  siezures are controlled. Reviewed most recent neuro note.  Assessment  1. Annual physical exam   2. Nonintractable epilepsy without status epilepticus, unspecified epilepsy type ([place])   3. Pelvic floor dysfunction in female   4. Postpartum depression      Plan  Female Wellness Visit:  Age appropriate Health Maintenance and Prevention measures were discussed with patient. Included topics are cancer screening recommendations, ways to keep healthy (see AVS) including dietary and exercise recommendations, regular eye and dental care, use of seat belts, and avoidance of moderate alcohol use and tobacco use. Screens up to date  BMI: discussed patient's BMI and encouraged positive lifestyle modifications to help get to or maintain a target BMI.  HM needs and immunizations were addressed and ordered. See below for orders. See HM and immunization section for updates.  Routine labs and screening tests ordered including cmp, cbc and lipids where appropriate. Need  urine testing. Pt to return tomorrow for lab.   Discussed recommendations regarding Vit D and calcium supplementation (see AVS)  Chronic disease f/u and/or acute problem visit: (deemed necessary to be done in addition to the wellness visit):  Continue current meds.   Follow up: No follow-ups on file.   Orders Placed This Encounter  Procedures  . Urine Culture  . CBC with Differential/Platelet  . Comprehensive metabolic panel  . Lipid panel  . TSH  . POCT urinalysis dipstick   No orders of the defined types were placed in this encounter.     Lifestyle: Body mass index is 17.18 kg/m. Wt Readings from Last 3 Encounters:  03/01/19 97 lb (44 kg)  11/29/18 102 lb (46.3 kg)  10/30/18 120 lb 9.6 oz (54.7 kg)   Diet: low fat Exercise: frequently,   Patient Active Problem List   Diagnosis Date Noted  . Postpartum depression 03/01/2019    Started on wellbutrin. Sexual side effects with zoloft   . Complex partial seizure ([place]) 03/21/2018  . Encephalomalacia 03/21/2018    Occipital bilateral   . Anxiety disorder 03/21/2018    DXd by Dr. Ysidro Evert, psychiatry: started tx with mirtazpine. Now pregnant on buspar   . Epilepsy ([place]) 01/30/2017  . Abnormal MRI of head 10/27/2015  . History of bacterial meningitis in infancy 10/27/2015   Health Maintenance  Topic Date Due  . INFLUENZA VACCINE  04/21/2019  . PAP SMEAR-Modifier  12/19/2020  . TETANUS/TDAP  06/28/2028  . HIV Screening  Discontinued   Immunization History  Administered Date(s) Administered  .  Influenza,inj,Quad PF,6+ Mos 05/31/2018  . MMR 09/27/2018  . Tdap 04/21/2015, 06/28/2018   We updated and reviewed the patient's past history in detail and it is documented below. Allergies: Patient  reports no history of alcohol use. Past Medical History Patient  has a past medical history of Anxiety disorder, Encephalomalacia (03/21/2018), GAD (generalized anxiety disorder) (03/21/2018), Heat intolerance, Hypoglycemia,  Meningitis, Postpartum depression (03/01/2019), and Seizures ([place]). Past Surgical History Patient  has a past surgical history that includes Eye surgery and Tympanostomy tube placement. Social History   Socioeconomic History  . Marital status: Married    Spouse name: Not on file  . Number of children: 0  . Years of education: 27  . Highest education level: Not on file  Occupational History    Comment: [employer]  Social Needs  . Financial resource strain: Not hard at all  . Food insecurity    Worry: Never true    Inability: Never true  . Transportation needs    Medical: No    Non-medical: Not on file  Tobacco Use  . Smoking status: Never Smoker  . Smokeless tobacco: Never Used  Substance and Sexual Activity  . Alcohol use: No    Alcohol/week: 0.0 standard drinks  . Drug use: No  . Sexual activity: Yes    Comment: currently pregnant   Lifestyle  . Physical activity    Days per week: Not on file    Minutes per session: Not on file  . Stress: Not at all  Relationships  . Social Herbalist on phone: Not on file    Gets together: Not on file    Attends religious service: Not on file    Active member of club or organization: Not on file    Attends meetings of clubs or organizations: Not on file    Relationship status: Not on file  Other Topics Concern  . Not on file  Social History Narrative   Lives with two roommates.     Right-handed.   Two 12oz cans of soda daily.   Family History  Problem Relation Age of Onset  . Depression Mother   . Miscarriages / [place] Mother   . Hypertension Maternal Grandfather   . Heart disease Maternal Grandfather   . Heart attack Maternal Grandfather   . Intellectual disability Brother   . Bipolar disorder Brother   . Autism Brother   . Diabetes Maternal Grandmother   . Cancer Maternal Grandmother     Review of Systems: Constitutional: negative for fever or malaise Ophthalmic: negative for photophobia, double vision or  loss of vision Cardiovascular: negative for chest pain, dyspnea on exertion, or new LE swelling Respiratory: negative for SOB or persistent cough Gastrointestinal: negative for abdominal pain, change in bowel habits or melena Genitourinary: negative for dysuria or gross hematuria, no abnormal uterine bleeding or disharge Musculoskeletal: negative for new gait disturbance or muscular weakness Integumentary: negative for new or persistent rashes, no breast lumps Neurological: negative for TIA or stroke symptoms Psychiatric: negative for SI or delusions Allergic/Immunologic: negative for hives  Patient Care Team    Relationship Specialty Notifications Start End  Leamon Arnt, MD PCP - General Family Medicine  09/25/18   Paticia Stack, PA-C Physician Assistant Physician Assistant  10/27/15   Paula Compton, MD Consulting Physician Obstetrics and Gynecology  03/21/18   Cameron Sprang, MD Consulting Physician Neurology  03/21/18     Objective  Vitals: BP 98/60   Pulse 76  Temp 98 F (36.7 C) (Oral)   Resp 14   Ht _0  (1.6 m)   Wt 97 lb (44 kg)   LMP 02/25/2019   SpO2 98%   BMI 17.18 kg/m  General:  Well developed, well nourished, no acute distress  Psych:  Alert and orientedx3,normal mood and affect HEENT:  Normocephalic, atraumatic, non-icteric sclera, PERRL, oropharynx is clear without mass or exudate, supple neck without adenopathy, mass or thyromegaly Cardiovascular:  Normal S1, S2, RRR without gallop, rub or murmur, nondisplaced PMI Respiratory:  Good breath sounds bilaterally, CTAB with normal respiratory effort Gastrointestinal: normal bowel sounds, soft, non-tender, no noted masses. No HSM MSK: no deformities, contusions. Joints are without erythema or swelling. Spine and CVA region are nontender Skin:  Warm, no rashes or suspicious lesions noted Neurologic:    Mental status is normal. CN 2-11 are normal. Gross motor and sensory exams are normal. Normal gait. No tremor      Commons side effects, risks, benefits, and alternatives for medications and treatment plan prescribed today were discussed, and the patient expressed understanding of the given instructions. Patient is instructed to call or message via MyChart if he/she has any questions or concerns regarding our treatment plan. No barriers to understanding were identified. We discussed Red Flag symptoms and signs in detail. Patient expressed understanding regarding what to do in case of urgent or emergency type symptoms.   Medication list was reconciled, printed and provided to the patient in AVS. Patient instructions and summary information was reviewed with the patient as documented in the AVS. This note was prepared with assistance of Dragon voice recognition software. Occasional wrong-word or sound-a-like substitutions may have occurred due to the inherent limitations of voice recognition software

## 2019-03-01 NOTE — Patient Instructions (Addendum)
Please return in 12 months for your annual complete physical; please come fasting. Please return tomorrow at 3pm for lab work and urine testing. I will release your lab results to you on your MyChart account with further instructions. Please reply with any questions.   If you have any questions or concerns, please don't hesitate to send me a message via MyChart or call the office at [phone]. Thank you for visiting with [place] today! It's our pleasure caring for you.   Preventive Care 18-39 Years, Female Preventive care refers to lifestyle choices and visits with your health care provider that can promote health and wellness. What does preventive care include?   A yearly physical exam. This is also called an annual well check.  Dental exams once or twice a year.  Routine eye exams. Ask your health care provider how often you should have your eyes checked.  Personal lifestyle choices, including: ? Daily care of your teeth and gums. ? Regular physical activity. ? Eating a healthy diet. ? Avoiding tobacco and drug use. ? Limiting alcohol use. ? Practicing safe sex. ? Taking vitamin and mineral supplements as recommended by your health care provider. What happens during an annual well check? The services and screenings done by your health care provider during your annual well check will depend on your age, overall health, lifestyle risk factors, and family history of disease. Counseling Your health care provider may ask you questions about your:  Alcohol use.  Tobacco use.  Drug use.  Emotional well-being.  Home and relationship well-being.  Sexual activity.  Eating habits.  Work and work Statistician.  Method of birth control.  Menstrual cycle.  Pregnancy history. Screening You may have the following tests or measurements:  Height, weight, and BMI.  Diabetes screening. This is done by checking your blood sugar (glucose) after you have not eaten for a while  (fasting).  Blood pressure.  Lipid and cholesterol levels. These may be checked every 5 years starting at age 39.  Skin check.  Hepatitis C blood test.  Hepatitis B blood test.  Sexually transmitted disease (STD) testing.  BRCA-related cancer screening. This may be done if you have a family history of breast, ovarian, tubal, or peritoneal cancers.  Pelvic exam and Pap test. This may be done every 3 years starting at age 69. Starting at age 28, this may be done every 5 years if you have a Pap test in combination with an HPV test. Discuss your test results, treatment options, and if necessary, the need for more tests with your health care provider. Vaccines Your health care provider may recommend certain vaccines, such as:  Influenza vaccine. This is recommended every year.  Tetanus, diphtheria, and acellular pertussis (Tdap, Td) vaccine. You may need a Td booster every 10 years.  Varicella vaccine. You may need this if you have not been vaccinated.  HPV vaccine. If you are 87 or younger, you may need three doses over 6 months.  Measles, mumps, and rubella (MMR) vaccine. You may need at least one dose of MMR. You may also need a second dose.  Pneumococcal 13-valent conjugate (PCV13) vaccine. You may need this if you have certain conditions and were not previously vaccinated.  Pneumococcal polysaccharide (PPSV23) vaccine. You may need one or two doses if you smoke cigarettes or if you have certain conditions.  Meningococcal vaccine. One dose is recommended if you are age 76-21 years and a first-year college student living in a residence hall, or if you  have one of several medical conditions. You may also need additional booster doses.  Hepatitis A vaccine. You may need this if you have certain conditions or if you travel or work in places where you may be exposed to hepatitis A.  Hepatitis B vaccine. You may need this if you have certain conditions or if you travel or work in  places where you may be exposed to hepatitis B.  Haemophilus influenzae type b (Hib) vaccine. You may need this if you have certain risk factors. Talk to your health care provider about which screenings and vaccines you need and how often you need them. This information is not intended to replace advice given to you by your health care provider. Make sure you discuss any questions you have with your health care provider. Document Released: 11/02/2001 Document Revised: 04/19/2017 Document Reviewed: 07/08/2015 Elsevier Interactive Patient Education  2019 [employer].

## 2019-03-02 ENCOUNTER — Other Ambulatory Visit (INDEPENDENT_AMBULATORY_CARE_PROVIDER_SITE_OTHER): Payer: No Typology Code available for payment source

## 2019-03-02 ENCOUNTER — Ambulatory Visit
Payer: No Typology Code available for payment source | Attending: Obstetrics and Gynecology | Admitting: Physical Therapy

## 2019-03-02 DIAGNOSIS — M62838 Other muscle spasm: Secondary | ICD-10-CM | POA: Insufficient documentation

## 2019-03-02 DIAGNOSIS — M6208 Separation of muscle (nontraumatic), other site: Secondary | ICD-10-CM | POA: Insufficient documentation

## 2019-03-02 DIAGNOSIS — Z0000 Encounter for general adult medical examination without abnormal findings: Secondary | ICD-10-CM

## 2019-03-02 DIAGNOSIS — M6281 Muscle weakness (generalized): Secondary | ICD-10-CM | POA: Insufficient documentation

## 2019-03-02 DIAGNOSIS — M2170 Unequal limb length (acquired), unspecified site: Secondary | ICD-10-CM | POA: Diagnosis present

## 2019-03-02 LAB — POCT URINALYSIS DIPSTICK
Bilirubin, UA: NEGATIVE
Blood, UA: POSITIVE
Glucose, UA: NEGATIVE
Ketones, UA: NEGATIVE
Leukocytes, UA: NEGATIVE
Nitrite, UA: NEGATIVE
Protein, UA: NEGATIVE
Spec Grav, UA: >=1.03 — AB (ref 1.010–1.025)
Urobilinogen, UA: 0.2 E.U./dL
pH, UA: 6 (ref 5.0–8.0)

## 2019-03-02 NOTE — Therapy (Addendum)
[facility] [address] [place], [place], [address] Phone: [phone]   Fax:  [phone]  Physical Therapy Evaluation  Patient Details  Name: Chelsea Collins MRN: [id number] Date of Birth: 1985-11-24 Referring Provider (PT): Asencion Partridgeamille Andy, MD    Encounter Date: 03/02/2019      Past Medical History:  Diagnosis Date  . Anxiety disorder   . Encephalomalacia 03/21/2018   Occipital bilateral  . GAD (generalized anxiety disorder) 03/21/2018  . Heat intolerance   . Hypoglycemia   . Meningitis   . Postpartum depression 03/01/2019  . Seizures ([facility])    as an infant    Past Surgical History:  Procedure Laterality Date  . EYE SURGERY    . TYMPANOSTOMY TUBE PLACEMENT      There were no vitals filed for this visit.   Subjective Assessment - 03/02/19 0815    Subjective 1) Pelvic Pain/ hip pain: Pt had a vaginal delivery on 09/25/2018 with her first child and had a 3rd degree tear. [facility] [place] [facility] [place] [facility] [place] and the techniques include gentle scar releases and trigger point releases. Pt noticed pelvic floor relaxed with the treatments with the first PT and then with the 2nd PT, her muscles went back to being tight and contracted.  Pt felt her pelvic floor go into spasms after the treatment. Pt is now taking muscle relaxers and pain medication. Without pain medication, pelvic pain is a 8/10 with spasms. Without pain medication is a 4/10.  Hip stretches prescribed by one PT helped. The second PT told her to stop these stretches for one month. Pt started to do these stretches again a couple days ago and they made her hips less tight.   Activities that cause pelvic pain: sometimes she notices her mm are contracted during sexual intercourse with certain positions ( doggie-style) and ( missionary style does not cause pain). Recently, pain occurs with walking around the house and before leaving her house, she notices hip pain,  buttocks L > R ,  pelvic tightness. Pt also notices tightness  in thighs along with  glut, pelvic, hip area after walking with baby with stroller to mailbox.   2)pelvic floor dysfunction:  Pt had fecal leakage epsiodes 2 weeks after the delivery of her 1st baby.  Pt had 2 more epsiodes and it was not as worse as the first. During her period she does not know if she has to pass gas or have a bowel movement.  Pt is not able to make it to the bathroom in time before leakage.When she started working with the 2nd PT,  she noticed urge incontinence from 4 x out of 6 days from 3x across 4 months.   SUI is present.  Pt also notices urinary frequency 2x in an hour. Pt noticed difficulty with starting urination which occurs when she was a teenager and after pregnancy , it became worst.    3) Chronic constipation: bowel movements occured every 3-4 days prior and after pregnancy.Fluid intake: 2 glasses (16 fl)  of water per day, 2 (16 fl oz ) soda.     Patient Stated Goals  Working and walking with stroller and baby without hip, pelvic, thigh,  glut tightness and have intercourse without pain         [facility] PT Assessment - 03/05/19 1853      Assessment   Medical Diagnosis  dyspareunia    Referring Provider (PT)  Senaida Oresichardson  Precautions   Precautions  None      Restrictions   Weight Bearing Restrictions  No      Balance Screen   Has the patient fallen in the past 6 months  No      Observation/Other Assessments   Observations  slumped sitting, ankles crossed       Coordination   Gross Motor Movements are Fluid and Coordinated  --   dyscoordination, limited diaphragmatic excursion   Fine Motor Movements are Fluid and Coordinated  --   pelvic floor contraction limited      Posture/Postural Control   Posture Comments  slight perturbation with SLS       AROM   Overall AROM Comments  WFL       Strength   Overall Strength Comments  B 3+/5       Palpation   SI assessment   L ASIS more  anterior, leg length in supine 81.5 cm, R 81 cm, L leg longer        Ambulation/Gait   Gait Pattern  --   guarded, no arm swing/ trunk rotation. R toe in.                Objective measurements completed on examination: See above findings.    Pelvic Floor Special Questions - 03/05/19 1854    Diastasis Recti  3 fingers with along linea alba with head lift, 1 knuckle depth with head relaxed       [facility] Adult PT Treatment/Exercise - 03/05/19 1853      Neuro Re-ed    Neuro Re-ed Details   see pt instructions, cues for proper sitting posture, sit to stand with deep core coactivation.  Provided shoe lift in R shoe               PT Long Term Goals - 03/02/19 1248      PT LONG TERM GOAL #1   Title  independent with HEP and understand how to progress herself    Time  10    Period  Weeks    Status  New    Target Date  05/11/19      PT LONG TERM GOAL #2   Title  Pt will decrease her PDI % from % to < % in order to improve QOL    Time  10    Period  Weeks    Status  New    Target Date  05/11/19      PT LONG TERM GOAL #3   Title  Pt will demo proper pelvic floor lengthening, deep core coordination in order to decrease urinary frequency, pelvic pain, and improve bowel movements    Time  6    Period  Weeks    Status  New    Target Date  04/13/19      PT LONG TERM GOAL #4   Title  Pt will demo equal alignment of pelvis and equal alignment iliac crest ( no anterior Lrotation) with compliance wearing shoe lift in R shoe   in order to be 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    Baseline  --    Time  2    Period  Weeks    Status  New    Target Date  03/16/19      PT LONG TERM GOAL #5   Title  Pt will report no fecal / urine leakage across 1 month in order to participate in community  activities and work duties    Time  8    Period  Weeks    Status  New    Target Date  04/27/19      Additional Long Term Goals   Additional Long Term Goals  Yes       PT LONG TERM GOAL #6   Title  Pt will demo proper body mechanics ( anterior tilt of pelvis in seating, baby lifting/ carrying, walking with trunk rotation) in order to improve mobility of lumbopelvis, increase pelvic stability to perform functional activities without straining pelvic area    Time  6    Period  Weeks    Status  New    Target Date  04/13/19      PT LONG TERM GOAL #7   Title  Pt will demo decreased abdominal separation from 3 fingers width along linea alba and 1 knuckle depth to < 1 finger width and decreased depth in order to increase intraabdominal pressure for multiple pelvic functions.    Time  4    Period  Weeks    Status  New    Target Date  03/30/19             Plan - 03/05/19 1900    Clinical Impression Statement  Pt is a 33 yo female who reports of pelvic floor dysfunction following the vaginal delivery of her first child which was accompanied by a 3rd degree tear. Her Sx include: pelvic pain, hip pain, urinary/ fecal leakage, urinary frequency, urge incontinence, constipation.  Associated factors that impact her Sx include poor water intake and high intake of bladder irritants. Pt had tried two different Pelvic PT with differing treatment approaches with worsening of Sx after starting treatment with the 2nd Pelvic PT.  Clinical presentations today showed leg length difference ( L longer), gait deviations ( R toe in, limited trunk rotation /arm swings, guarded), dyscoordination and strength of pelvic floor mm, weak hip/BLE weakness, poor body mechanics which places strain on the abdominal/pelvic floor mm, and diastasis recti. These are deficits that indicate an ineffective intraabdominal pressure system associated with her Sx.   Given her LE imbalances and weakness, pt would benefit from a regional interdependent approach to will yield greater benefits.  Pt would also benefit from a biopsychosocial approach to yield optimal outcomes given pain Sx occuring  post-partum. Plan to build interdisciplinary team with pt's providers to optimize patient-centered care.  Following Tx today which pt tolerated without complaints with body mechanics training to optimize lengthening of pelvic floor. Shoe lift was provided for R foot to accommodate for longer L leg.   Pt was provided extension education on etiology of Sx with anatomy, physiology explanation with images along with the benefits of customized pelvic PT Tx based on pt's medical conditions and musculoskeletal deficits.  Explained the physiology of deep core mm coordination and roles of pelvic floor function in urination, defecation, sexual function, and postural control with deep core mm system.   Plan to treat diastasis recti at upcoming sessions.     Personal Factors and Comorbidities  Past/Current Experience;Time since onset of injury/illness/exacerbation    Examination-Activity Limitations  Continence;Toileting;Other    Examination-Participation Restrictions  Community Activity    Stability/Clinical Decision Making  Evolving/Moderate complexity    Rehab Potential  Good    PT Frequency  1x / week    PT Duration  Other (comment)   10   PT Treatment/Interventions  Cryotherapy;Biofeedback;Moist Heat;Ultrasound;Therapeutic activities;Therapeutic exercise;Neuromuscular re-education;Manual techniques;Patient/family education;Scar mobilization;Dry  needling    Consulted and Agree with Plan of Care  Patient       Patient will benefit from skilled therapeutic intervention in order to improve the following deficits and impairments:  Pain, Increased fascial restricitons, Decreased coordination, Decreased mobility, Increased muscle spasms, Decreased activity tolerance, Decreased endurance, Decreased strength, Decreased knowledge of use of DME, Postural dysfunction, Improper body mechanics, Difficulty walking  Visit Diagnosis: Muscle weakness (generalized) - Plan:  Other muscle spasm - Plan:   Leg length  discrepancy - Plan: Diastasis recti - Plan:      Problem List Patient Active Problem List   Diagnosis Date Noted  . Postpartum depression 03/01/2019  . Complex partial seizure ([place]) 03/21/2018  . Encephalomalacia 03/21/2018  . Anxiety disorder 03/21/2018  . Epilepsy ([place]) 01/30/2017  . Abnormal MRI of head 10/27/2015  . History of bacterial meningitis in infancy 10/27/2015    Jerl Mina ,PT, DPT, E-RYT  03/05/2019, 7:01 PM  [place] MAIN [facility] SERVICES [address] [place], [place], [address] Phone: [phone]   Fax:  [phone]  Name: Chelsea Collins MRN: [id number] Date of Birth: [birth year]

## 2019-03-02 NOTE — Addendum Note (Signed)
Addended by: WARREN-COBBS, Athalee Esterline T on: 03/02/2019 03:06 PM   Modules accepted: Orders  

## 2019-03-02 NOTE — Addendum Note (Signed)
Addended by: Francis Dowse T on: 03/02/2019 03:06 PM   Modules accepted: Orders

## 2019-03-02 NOTE — Patient Instructions (Addendum)
Increase water intake from 2 glasses (16 fl) Add 16 fl oz on  mid morning and mid afternoon  ( total 32 fl)   ___ Per day: 48 fl oz  ____________   Decrease downward forces onto pelvic floor: Sitting with feet on the ground , not cross  Place a towel to sit on to prop more on the sitting bones.      ___________   Proper body mechanics with getting out of a chair to decrease strain  on back &pelvic floor   Avoid holding your breath when Getting out of the chair:  Scoot to front part of chair chair Heels behind feet, feet are hip width apart, nose over toes  Inhale like you are smelling roses Exhale to stand    _____  Wear shoe lift in R shoe

## 2019-03-04 LAB — CBC WITH DIFFERENTIAL/PLATELET
Absolute Monocytes: 319 cells/uL (ref 200–950)
Basophils Absolute: 29 cells/uL (ref 0–200)
Basophils Relative: 0.5 %
Eosinophils Absolute: 51 cells/uL (ref 15–500)
Eosinophils Relative: 0.9 %
HCT: 37 % (ref 35.0–45.0)
Hemoglobin: 12.1 g/dL (ref 11.7–15.5)
Lymphs Abs: 2269 cells/uL (ref 850–3900)
MCH: 29.3 pg (ref 27.0–33.0)
MCHC: 32.7 g/dL (ref 32.0–36.0)
MCV: 89.6 fL (ref 80.0–100.0)
MPV: 11.7 fL (ref 7.5–12.5)
Monocytes Relative: 5.6 %
Neutro Abs: 3032 cells/uL (ref 1500–7800)
Neutrophils Relative %: 53.2 %
Platelets: 256 10*3/uL (ref 140–400)
RBC: 4.13 10*6/uL (ref 3.80–5.10)
RDW: 12.7 % (ref 11.0–15.0)
Total Lymphocyte: 39.8 %
WBC: 5.7 10*3/uL (ref 3.8–10.8)

## 2019-03-04 LAB — LIPID PANEL
Cholesterol: 170 mg/dL (ref <200)
HDL: 56 mg/dL (ref >=50)
LDL Cholesterol (Calc): 99 mg/dL (calc)
Non-HDL Cholesterol (Calc): 114 mg/dL (calc) (ref <130)
Total CHOL/HDL Ratio: 3 (calc) (ref <5.0)
Triglycerides: 64 mg/dL (ref <150)

## 2019-03-04 LAB — COMPREHENSIVE METABOLIC PANEL
AG Ratio: 1.9 (calc) (ref 1.0–2.5)
ALT: 6 U/L (ref 6–29)
AST: 12 U/L (ref 10–30)
Albumin: 4.3 g/dL (ref 3.6–5.1)
Alkaline phosphatase (APISO): 44 U/L (ref 31–125)
BUN: 10 mg/dL (ref 7–25)
CO2: 24 mmol/L (ref 20–32)
Calcium: 9.3 mg/dL (ref 8.6–10.2)
Chloride: 104 mmol/L (ref 98–110)
Creat: 0.67 mg/dL (ref 0.50–1.10)
Globulin: 2.3 g/dL (calc) (ref 1.9–3.7)
Glucose, Bld: 87 mg/dL (ref 65–99)
Potassium: 4.4 mmol/L (ref 3.5–5.3)
Sodium: 140 mmol/L (ref 135–146)
Total Bilirubin: 0.2 mg/dL (ref 0.2–1.2)
Total Protein: 6.6 g/dL (ref 6.1–8.1)

## 2019-03-04 LAB — TSH: TSH: 0.57 mIU/L

## 2019-03-04 LAB — URINE CULTURE
MICRO NUMBER:: 564501
SPECIMEN QUALITY:: ADEQUATE

## 2019-03-05 ENCOUNTER — Ambulatory Visit: Payer: Self-pay | Admitting: Family Medicine

## 2019-03-05 MED ORDER — AMOXICILLIN 875 MG PO TABS: 875.0000 mg | ORAL_TABLET | Freq: Two times a day (BID) | ORAL | 0 refills | Status: AC

## 2019-03-05 MED FILL — AMOXICILLIN 875 MG TABS: 875 | 7 days supply | Qty: 14 | Fill #0

## 2019-03-05 NOTE — Addendum Note (Signed)
Addended by: Jerl Mina on: 03/05/2019 07:15 PM   Modules accepted: Orders

## 2019-03-05 NOTE — Telephone Encounter (Signed)
Pt called in and was given her lab results from Dr. Billey Chang dated 03/05/2019 at 10:08 AM.  I let her know her Rx was sent the the [facility].  No questions.

## 2019-03-06 ENCOUNTER — Ambulatory Visit (INDEPENDENT_AMBULATORY_CARE_PROVIDER_SITE_OTHER): Payer: No Typology Code available for payment source | Admitting: Psychology

## 2019-03-06 DIAGNOSIS — F411 Generalized anxiety disorder: Secondary | ICD-10-CM

## 2019-03-06 DIAGNOSIS — F4321 Adjustment disorder with depressed mood: Secondary | ICD-10-CM | POA: Diagnosis not present

## 2019-03-08 MED FILL — TRINTELLIX 5 MG TABLET: 5 | 90 days supply | Qty: 90 | Fill #0

## 2019-03-09 ENCOUNTER — Other Ambulatory Visit: Payer: Self-pay

## 2019-03-09 ENCOUNTER — Ambulatory Visit: Payer: No Typology Code available for payment source | Admitting: Physical Therapy

## 2019-03-09 DIAGNOSIS — M6208 Separation of muscle (nontraumatic), other site: Secondary | ICD-10-CM

## 2019-03-09 DIAGNOSIS — M2170 Unequal limb length (acquired), unspecified site: Secondary | ICD-10-CM

## 2019-03-09 DIAGNOSIS — M6281 Muscle weakness (generalized): Secondary | ICD-10-CM | POA: Diagnosis not present

## 2019-03-09 DIAGNOSIS — M62838 Other muscle spasm: Secondary | ICD-10-CM

## 2019-03-09 NOTE — Therapy (Addendum)
[place] MAIN [facility] SERVICES [address] [place], [place], [address] Phone: [phone]   Fax:  [phone]  Physical Therapy Treatment  Patient Details  Name: Chelsea Collins MRN: [id number] Date of Birth: 07/30/1986 Referring Provider (PT): Di Kindle Date: 03/09/2019  PT End of Session - 03/09/19 0905    Visit Number  2    Number of Visits  10    Date for PT Re-Evaluation  05/11/19    PT Start Time  0803    PT Stop Time  0905    PT Time Calculation (min)  62 min    Activity Tolerance  Patient tolerated treatment well;No increased pain       Past Medical History:  Diagnosis Date  . Anxiety disorder   . Encephalomalacia 03/21/2018   Occipital bilateral  . GAD (generalized anxiety disorder) 03/21/2018  . Heat intolerance   . Hypoglycemia   . Meningitis   . Postpartum depression 03/01/2019  . Seizures ([place])    as an infant    Past Surgical History:  Procedure Laterality Date  . EYE SURGERY    . TYMPANOSTOMY TUBE PLACEMENT      There were no vitals filed for this visit.  Subjective Assessment - 03/09/19 0811    Subjective  Pt noticed the R shoe lift helping to decrease her L hip pain by 60-70% with walking.  Pt noticed an irritation of her bones on the L inner thigh with sitting on the floor or sitting on her legs.  Pt was 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 without pai. Pt did not have to take pain medication. Pt also noticed no fecal l eakage when onher period last week    Patient Stated Goals  Working and walking with stroller and baby without hip, pelvic, thigh,  glut tightness and have intercourse without pain         [facility] PT Assessment - 03/09/19 0814      Observation/Other Assessments   Observations  poor propioception of pelvic stability with ankles crossed. propioceptionof hip stability in standing balance exercise.        Other:   Other/ Comments  sitting on the floor:  on ankles, when  crossed legged, loss of lumbar lordosis       Palpation   SI assessment   L SIJ lateral border limited in hip abd/ER  (p ost Tx increased mobility ) .  tightness at ischial tuberosity ( decreased post Tx)        Ambulation/Gait   Gait Comments  B toe in, genu valgus, pronation                 Pelvic Floor Special Questions - 03/09/19 0904    Diastasis Recti  no separation , no depth, proper closure of DRA             PT Long Term Goals - 03/09/19 1050      PT LONG TERM GOAL #1   Title  independent with HEP and understand how to progress herself    Time  10    Period  Weeks    Status  On-going      PT LONG TERM GOAL #2   Title  Pt will decrease her [place]  from 52 % to <25 % in order to improve QOL    Time  10    Period  Weeks    Status  On-going  PT LONG TERM GOAL #3   Title  Pt will demo proper pelvic floor lengthening, deep core coordination in order to decrease urinary frequency, pelvic pain, and improve bowel movements    Time  6    Period  Weeks    Status  On-going      PT LONG TERM GOAL #4   Title  Pt will demo equal alignment of pelvis and equal alignment iliac crest ( no anterior Lrotation) with compliance wearing shoe lift in R shoe   in order to be 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    Time  2    Period  Weeks    Status  Achieved      PT LONG TERM GOAL #5   Title  Pt will report no fecal / urine leakage across 1 month in order to participate in community activities and work duties    Time  8    Period  Weeks    Status  On-going      PT LONG TERM GOAL #6   Title  Pt will demo proper body mechanics ( anterior tilt of pelvis in seating, baby lifting/ carrying, walking with trunk rotation) in order to improve mobility of lumbopelvis, increase pelvic stability to perform functional activities without straining pelvic area    Time  6    Period  Weeks    Status  On-going      PT LONG TERM GOAL #7   Title  Pt will demo  decreased abdominal separation from 3 fingers width along linea alba and 1 knuckle depth to < 1 finger width and decreased depth in order to increase intraabdominal pressure for multiple pelvic functions.    Time  4    Period  Weeks    Status  Achieved      PT LONG TERM GOAL #8   Title  Pt will decrease her NIH-CPSI score from 65% to < 30% in order to improve pelvic floor function and QOL    Time  7    Period  Weeks    Status  New    Target Date  04/27/19              Plan - 03/09/19 0905    Clinical Impression Statement Pt noticed the R shoe lift helping to decrease her L hip pain by 60-70% with walking.   Pt was 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 without pai. Pt did not have to take pain medication. Pt also noticed no fecal leakage when on her period last week. These are signs of great improvements after adjusting for her leg length difference ( L higher than R). Diastasis recti resolved with shoe lift and without manual Tx required.  Pt showed equal alignment of pelvis today without obliquities but showed toe-in B, genus valgus, pronation.  Further addressed lower kinetic chain with regional interdependent approach today with manual Tx and neuromuscular re-education.    Pt achieved increased hip abduction/ER on L hip and increased SIJ mobility post Tx. Added hip abduction strengthening due to weakness today. Plan to assess ankles and add strengthen ankles to address pronation.  Addressed sitting posture with stool / hips elevated to maintain lumbar lordosis when sitting  on the floor where pt spends time on her baby.   Pt continues to benefit from skilled PT.       Personal Factors and Comorbidities  Past/Current Experience;Time since onset of injury/illness/exacerbation  Examination-Activity Limitations  Continence;Toileting;Other    Examination-Participation Restrictions  Community Activity    Stability/Clinical Decision Making  Evolving/Moderate complexity     Rehab Potential  Good    PT Frequency  1x / week    PT Duration  Other (comment)   10   PT Treatment/Interventions  Cryotherapy;Biofeedback;Moist Heat;Ultrasound;Therapeutic activities;Therapeutic exercise;Neuromuscular re-education;Manual techniques;Patient/family education;Scar mobilization;Dry needling    Consulted and Agree with Plan of Care  Patient       Patient will benefit from skilled therapeutic intervention in order to improve the following deficits and impairments:  Pain, Increased fascial restricitons, Decreased coordination, Decreased mobility, Increased muscle spasms, Decreased activity tolerance, Decreased endurance, Decreased strength, Decreased knowledge of use of DME, Postural dysfunction, Improper body mechanics, Difficulty walking  Visit Diagnosis: 1. Muscle weakness (generalized)   2. Other muscle spasm   3. Leg length discrepancy   4. Diastasis recti        Problem List Patient Active Problem List   Diagnosis Date Noted  . Postpartum depression 03/01/2019  . Complex partial seizure (HCC) 03/21/2018  . Encephalomalacia 03/21/2018  . Anxiety disorder 03/21/2018  . Epilepsy (HCC) 01/30/2017  . Abnormal MRI of head 10/27/2015  . History of bacterial meningitis in infancy 10/27/2015    Mariane MastersYeung,Shin Yiing 03/09/2019, 9:15 AM  [facility] [address] [place], [place], [address] Phone: [phone]   Fax:  [phone]  Name: Jolyne LoaCrystal Winecoff MRN: [id number] Date of Birth: [birth year]

## 2019-03-09 NOTE — Patient Instructions (Addendum)
On your side with pillow between knees to keep hips and knees aligned   Avoid straining pelvic floor, abdominal muscles , spine  Use log rolling technique instead of getting out of bed with your neck or the sit-up     Log rolling into and out of bed If getting out of bed on R side, 1) Bent knees, scoot hips/ shoulder to L   Raise R arm completely overhead, rolling onto armpit  Then lower bent knees to bed to get into complete side lying position  Then drop legs off bed, and push up onto R elbow/forearm, and use L hand to push onto the bed    ____  Exercises for hip strengthening:  Morning and night and afternoon :  Clam shells 20 reps with pillow between knees    Complimentary stretch: [employer] _ foot over _ thigh, opposite knee straight  3 breaths   Cross thigh over thigh 10 reps   * Keep pelvis levelled with tactile cue with hand under back of hips  * Slide the ankle of the supporting foot out to decrease the angle which can help level the pelvis   ______  During day:  2 sets  30 reps     Standing   3 point tap   Feet are hip width Tap forward, center under hip not feet next to each other  Tap middle\, center  Tap back   ______  Purchase rolling cart to decrease shoulder imbalance  ______ Sitting on the floor With stool or cross cross apple sauce with a elevated surface ( folded wool blanket or yoga block to let knees  drop lower, low back arches )

## 2019-03-16 ENCOUNTER — Other Ambulatory Visit: Payer: Self-pay

## 2019-03-16 ENCOUNTER — Ambulatory Visit: Payer: No Typology Code available for payment source | Admitting: Physical Therapy

## 2019-03-16 DIAGNOSIS — M6208 Separation of muscle (nontraumatic), other site: Secondary | ICD-10-CM

## 2019-03-16 DIAGNOSIS — M6281 Muscle weakness (generalized): Secondary | ICD-10-CM

## 2019-03-16 DIAGNOSIS — M2170 Unequal limb length (acquired), unspecified site: Secondary | ICD-10-CM

## 2019-03-16 DIAGNOSIS — M62838 Other muscle spasm: Secondary | ICD-10-CM

## 2019-03-16 NOTE — Patient Instructions (Addendum)
Progress clam shell with red band seated  10-x 2 reps  /day Align feet and knee with hip  Proper sitting position with feet firm on ground, hands by hips for upright posture, anterior tilt    __   handout on deep core level 1 and 2

## 2019-03-16 NOTE — Therapy (Signed)
[place] MAIN [facility] SERVICES [address] [place], [place], [address] Phone: [phone]   Fax:  [phone]  Physical Therapy Treatment  Patient Details  Name: Chelsea Collins MRN: [id number] Date of Birth: 18-Jun-1986 Referring Provider (PT): Di Kindle Date: 03/16/2019  PT End of Session - 03/16/19 0910    Visit Number  3    Number of Visits  10    Date for PT Re-Evaluation  05/11/19    PT Start Time  0800    PT Stop Time  0910    PT Time Calculation (min)  70 min    Activity Tolerance  Patient tolerated treatment well;No increased pain       Past Medical History:  Diagnosis Date  . Anxiety disorder   . Encephalomalacia 03/21/2018   Occipital bilateral  . GAD (generalized anxiety disorder) 03/21/2018  . Heat intolerance   . Hypoglycemia   . Meningitis   . Postpartum depression 03/01/2019  . Seizures ([place])    as an infant    Past Surgical History:  Procedure Laterality Date  . EYE SURGERY    . TYMPANOSTOMY TUBE PLACEMENT      There were no vitals filed for this visit.  Subjective Assessment - 03/16/19 0804    Subjective  Pt notices a tightness sensation that covers the area of finger length and width which occurs inner back of her L thigh after walking. After working and being on her feet for a while, pt noticed her L low back felt tight. Pt bought an instacart for the bags she carry to work so she can pull her bags in it. Pt is sleeping better and waking up without pain in her back and hips with the use of pillow between thighs.    Patient Stated Goals  Working and walking with stroller and baby without hip, pelvic, thigh,  glut tightness and have intercourse without pain         [facility] PT Assessment - 03/16/19 0806      Observation/Other Assessments   Observations  less slouched position       Coordination   Gross Motor Movements are Fluid and Coordinated  --   moderate cues deep core level 1-2, poor  propioception at fee   Fine Motor Movements are Fluid and Coordinated  --   feet/ knee propioception poor in seated and hooklying position, poor pelvic tilt     Strength   Overall Strength Comments  hip flex/knee ext 4-/5 B, B knee ext 3+/5 . hip ext 4+/5 B  pre Tx:B hip abd 4-/5, post Tx 5/5 B, ankle DF/EV OKC 5/5.       Palpation   SI assessment   apex of sacral border L hypomobile, tenderness , concordant sign where pt reports feeling tightness after working and being on her feet for long hours) . tightness along sacroiliac ligaments. Tightness at L ischial rami     Ambulation/Gait   Gait Comments  longer stride, but arms crossed across the chest, toe in B                     [facility] Adult PT Treatment/Exercise - 03/16/19 0806      Therapeutic Activites    Therapeutic Activities  --   gait mechanics      Neuro Re-ed    Neuro Re-ed Details   see pt instruction, moderate cues for deep core level 1-2 to minimize lumbopelvic perturbations  Moist Heat Therapy   Number Minutes Moist Heat  --   6 ( applied during instruction for deep core level 1-2)     Manual Therapy   Manual therapy comments  MWM/ Grade I-II mob at L apex of sacrum, STM at sacroiliac liagment. STM/MWM at ischial rami L.               PT Short Term Goals - 01/16/19 1405      PT SHORT TERM GOAL #1   Title  independent with initial HEP    Time  4    Period  Weeks    Status  Achieved      PT SHORT TERM GOAL #2   Title  husband understands how to perform soft tissue work to the perineum to soften the tissue    Time  4    Period  Weeks    Status  Achieved      PT SHORT TERM GOAL #3   Title  understand how to take care of the vaginal tissue    Time  4    Period  Weeks    Status  Achieved        PT Long Term Goals - 03/09/19 1050      PT LONG TERM GOAL #1   Title  independent with HEP and understand how to progress herself    Time  10    Period  Weeks    Status  On-going       PT LONG TERM GOAL #2   Title  Pt will decrease her PDI  from 52 % to <25 % in order to improve QOL    Time  10    Period  Weeks    Status  On-going      PT LONG TERM GOAL #3   Title  Pt will demo proper pelvic floor lengthening, deep core coordination in order to decrease urinary frequency, pelvic pain, and improve bowel movements    Time  6    Period  Weeks    Status  On-going      PT LONG TERM GOAL #4   Title  Pt will demo equal alignment of pelvis and equal alignment iliac crest ( no anterior Lrotation) with compliance wearing shoe lift in R shoe   in order to be 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    Time  2    Period  Weeks    Status  Achieved      PT LONG TERM GOAL #5   Title  Pt will report no fecal / urine leakage across 1 month in order to participate in community activities and work duties    Time  8    Period  Weeks    Status  On-going      PT LONG TERM GOAL #6   Title  Pt will demo proper body mechanics ( anterior tilt of pelvis in seating, baby lifting/ carrying, walking with trunk rotation) in order to improve mobility of lumbopelvis, increase pelvic stability to perform functional activities without straining pelvic area    Time  6    Period  Weeks    Status  On-going      PT LONG TERM GOAL #7   Title  Pt will demo decreased abdominal separation from 3 fingers width along linea alba and 1 knuckle depth to < 1 finger width and decreased depth in order to increase intraabdominal pressure for multiple pelvic  functions.    Time  4    Period  Weeks    Status  Achieved      PT LONG TERM GOAL #8   Title  Pt will decrease her NIH-CPSI score from 65% to < 30% in order to improve pelvic floor function and QOL    Time  7    Period  Weeks    Status  New    Target Date  04/27/19            Plan - 03/16/19 1038    Clinical Impression Statement Pt is having improvements with sleeping without pain and minor tightness with working long hours or  being on her feet.  Pt continues to demo equal pelvic alignment but required limited mobility at L apex of sacrum. Manual Tx helped to improve this deficit and pt demo'd increased hip abduction strength and hip ext post Tx. Pt required excessive cues with lower kinetic chain propioception and will require further training to achieve anterior tilt of pelvis. Plan to add hamstring mm strength at next session due to weakness and to assess corrective exercises and Tx for B toe in pattern in gait ( R > L).  Pt continues to benefit from skilled PT.   Personal Factors and Comorbidities  Past/Current Experience;Time since onset of injury/illness/exacerbation    Examination-Activity Limitations  Continence;Toileting;Other    Examination-Participation Restrictions  Community Activity    Stability/Clinical Decision Making  Evolving/Moderate complexity    Rehab Potential  Good    PT Frequency  1x / week    PT Duration  Other (comment)   10   PT Treatment/Interventions  Cryotherapy;Biofeedback;Moist Heat;Ultrasound;Therapeutic activities;Therapeutic exercise;Neuromuscular re-education;Manual techniques;Patient/family education;Scar mobilization;Dry needling    Consulted and Agree with Plan of Care  Patient       Patient will benefit from skilled therapeutic intervention in order to improve the following deficits and impairments:  Pain, Increased fascial restricitons, Decreased coordination, Decreased mobility, Increased muscle spasms, Decreased activity tolerance, Decreased endurance, Decreased strength, Decreased knowledge of use of DME, Postural dysfunction, Improper body mechanics, Difficulty walking  Visit Diagnosis: 1. Muscle weakness (generalized)   2. Other muscle spasm   3. Leg length discrepancy   4. Diastasis recti        Problem List Patient Active Problem List   Diagnosis Date Noted  . Postpartum depression 03/01/2019  . Complex partial seizure (HCC) 03/21/2018  . Encephalomalacia  03/21/2018  . Anxiety disorder 03/21/2018  . Epilepsy (HCC) 01/30/2017  . Abnormal MRI of head 10/27/2015  . History of bacterial meningitis in infancy 10/27/2015    Mariane MastersYeung,Shin Yiing 03/16/2019, 10:41 AM  [facility] [address] [place], [place], [address] Phone: [phone]   Fax:  [phone]  Name: Chelsea Collins MRN: [id number] Date of Birth: September 28, 1985

## 2019-03-22 ENCOUNTER — Ambulatory Visit
Payer: No Typology Code available for payment source | Attending: Obstetrics and Gynecology | Admitting: Physical Therapy

## 2019-03-22 ENCOUNTER — Other Ambulatory Visit: Payer: Self-pay

## 2019-03-22 DIAGNOSIS — M62838 Other muscle spasm: Secondary | ICD-10-CM | POA: Insufficient documentation

## 2019-03-22 DIAGNOSIS — M6281 Muscle weakness (generalized): Secondary | ICD-10-CM | POA: Diagnosis not present

## 2019-03-22 DIAGNOSIS — M6208 Separation of muscle (nontraumatic), other site: Secondary | ICD-10-CM | POA: Insufficient documentation

## 2019-03-22 DIAGNOSIS — M2170 Unequal limb length (acquired), unspecified site: Secondary | ICD-10-CM | POA: Insufficient documentation

## 2019-03-22 NOTE — Patient Instructions (Addendum)
Discontinue  seated clams with red band  Replace with sidelying clams ( pillow between knees)  20 reps    Add  elbow slightly ahead of shoulder  Forearm at 120 deg from abdomen  Side flank lengthened 5 reps x 5 reps  To strengthen shoulders    Deep core level 1 and 2 continue  Hands on chest/ abdomen to practice making sure not chest breathing And ensuring more lengthened breat 1-2 pause in inhale, 2-1 pause on exhale.   ____  Discontinue to standing 3 points taps   Stretch Pelvic floor  1) "v heels slide away and then back toward buttocks and then rock knee to slight ,  slide heel along at 11 o clock away from buttocks   10 reps   ________  Hamstring stretch modified  Toes up, hands behind on the floor, to avoid hunched posture   Figure -4 stretch modified hands behind on the chair, DONOT PUSH KNEE DOWN.  Lean at the waist forward , not the head Ankles not twisted, support foot on ground

## 2019-03-22 NOTE — Therapy (Signed)
[place] [facility] [address] [place], [place], [address] Phone: [phone]   Fax:  [phone]  Physical Therapy Treatment  Patient Details  Name: Chelsea Collins MRN: [id number] Date of Birth: 03-25-1986 Referring Provider (PT): Aline Augustichardson   Encounter Date: 03/22/2019  PT End of Session - 03/22/19 1002    Visit Number  4    Number of Visits  10    Date for PT Re-Evaluation  05/11/19    PT Start Time  0800    PT Stop Time  0910    PT Time Calculation (min)  70 min    Activity Tolerance  Patient tolerated treatment well;No increased pain       Past Medical History:  Diagnosis Date  . Anxiety disorder   . Encephalomalacia 03/21/2018   Occipital bilateral  . GAD (generalized anxiety disorder) 03/21/2018  . Heat intolerance   . Hypoglycemia   . Meningitis   . Postpartum depression 03/01/2019  . Seizures (HCC)    as an infant    Past Surgical History:  Procedure Laterality Date  . EYE SURGERY    . TYMPANOSTOMY TUBE PLACEMENT      There were no vitals filed for this visit.  Subjective Assessment - 03/22/19 0806    Subjective  Pt reported feeling sore after last week. Pt did her band exercises this morning and felt a knot on her perineum near her anus.    Patient Stated Goals  Working and walking with stroller and baby without hip, pelvic, thigh,  glut tightness and have intercourse without pain         [facility] PT Assessment - 03/22/19 1025      Observation/Other Assessments   Observations  poor propioception of body, disassociation between trunk and BLE and neck       Palpation   SI assessment   ASIS levelled , no rotation                  Pelvic Floor Special Questions - 03/22/19 1025    External Perineal Exam  increased tightness at obt int on R.  tenderness B at posterior pelvic floor          [facility] Adult PT Treatment/Exercise - 03/22/19 1004      Neuro Re-ed    Neuro Re-ed Details   excessive  cues for deep core 1, less chest breathing, cues for stability in clam versions seated, sidelying, and side plank on forearms, cued for propioception         Exercises   Exercises  --   modified HEP, downgraded exercises      Manual Therapy   Manual therapy comments  STM/MWM at obt int R                   PT Long Term Goals - 03/09/19 1050      PT LONG TERM GOAL #1   Title  independent with HEP and understand how to progress herself    Time  10    Period  Weeks    Status  On-going      PT LONG TERM GOAL #2   Title  Pt will decrease her PDI  from 52 % to <25 % in order to improve QOL    Time  10    Period  Weeks    Status  On-going      PT LONG TERM GOAL #3   Title  Pt will demo  proper pelvic floor lengthening, deep core coordination in order to decrease urinary frequency, pelvic pain, and improve bowel movements    Time  6    Period  Weeks    Status  On-going      PT LONG TERM GOAL #4   Title  Pt will demo equal alignment of pelvis and equal alignment iliac crest ( no anterior Lrotation) with compliance wearing shoe lift in R shoe   in order to be 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    Time  2    Period  Weeks    Status  Achieved      PT LONG TERM GOAL #5   Title  Pt will report no fecal / urine leakage across 1 month in order to participate in community activities and work duties    Time  8    Period  Weeks    Status  On-going      PT LONG TERM GOAL #6   Title  Pt will demo proper body mechanics ( anterior tilt of pelvis in seating, baby lifting/ carrying, walking with trunk rotation) in order to improve mobility of lumbopelvis, increase pelvic stability to perform functional activities without straining pelvic area    Time  6    Period  Weeks    Status  On-going      PT LONG TERM GOAL #7   Title  Pt will demo decreased abdominal separation from 3 fingers width along linea alba and 1 knuckle depth to < 1 finger width and decreased depth  in order to increase intraabdominal pressure for multiple pelvic functions.    Time  4    Period  Weeks    Status  Achieved      PT LONG TERM GOAL #8   Title  Pt will decrease her NIH-CPSI score from 65% to < 30% in order to improve pelvic floor function and QOL    Time  7    Period  Weeks    Status  New    Target Date  04/27/19            Plan - 03/22/19 1003    Clinical Impression Statement  Pt demo'd proper alignment of pelvis with compliance to shoe lift. Pt's gait and posture is improving. Performed external  Manual Tx to release R pelvic floor tightness which pt tolerated well. Suspect pt's report of increased tightness over the past week likely due to improper stabilization strategies in seated clam exercise. Downgraded exercises with resistance band to gravity eliminated position without band to minimize overuse of pelvic floor and optimize thoracolumbar strengthening.  Pt required excessive cues for propioception training. Excessive cues for proper deep core coordination with optimal diaphragm/ pelvic excursion. Pt demo'd correct technique post Tx. Modified exercises prescribed by prior PTs with alignment and technique. Plan to continue with thoracolumbar strengthening and treat toe-in presentation.  Pt continues to benefit from skilled PT.    Personal Factors and Comorbidities  Past/Current Experience;Time since onset of injury/illness/exacerbation    Examination-Activity Limitations  Continence;Toileting;Other    Examination-Participation Restrictions  Community Activity    Stability/Clinical Decision Making  Evolving/Moderate complexity    Rehab Potential  Good    PT Frequency  1x / week    PT Duration  Other (comment)   10   PT Treatment/Interventions  Cryotherapy;Biofeedback;Moist Heat;Ultrasound;Therapeutic activities;Therapeutic exercise;Neuromuscular re-education;Manual techniques;Patient/family education;Scar mobilization;Dry needling    Consulted and Agree with Plan  of Care  Patient  Patient will benefit from skilled therapeutic intervention in order to improve the following deficits and impairments:  Pain, Increased fascial restricitons, Decreased coordination, Decreased mobility, Increased muscle spasms, Decreased activity tolerance, Decreased endurance, Decreased strength, Decreased knowledge of use of DME, Postural dysfunction, Improper body mechanics, Difficulty walking  Visit Diagnosis: 1. Muscle weakness (generalized)   2. Other muscle spasm   3. Leg length discrepancy   4. Diastasis recti        Problem List Patient Active Problem List   Diagnosis Date Noted  . Postpartum depression 03/01/2019  . Complex partial seizure ([place]) 03/21/2018  . Encephalomalacia 03/21/2018  . Anxiety disorder 03/21/2018  . Epilepsy ([place]) 01/30/2017  . Abnormal MRI of head 10/27/2015  . History of bacterial meningitis in infancy 10/27/2015    Jerl Mina ,PT, DPT, E-RYT  03/22/2019, 10:28 AM  [place] MAIN [facility] SERVICES [address] [place], [place], [address] Phone: [phone]   Fax:  [phone]  Name: Chelsea Collins MRN: [id number] Date of Birth: 06/16/1986

## 2019-03-26 ENCOUNTER — Telehealth: Payer: Self-pay | Admitting: Family Medicine

## 2019-03-26 NOTE — Telephone Encounter (Signed)
Copied from [place] [id number]. Topic: General - Other >> Mar 26, 2019  1:03 PM Valla Leaver wrote: Reason for CRM: Patient wants to know if Dr. Jonni Sanger will oversee her physical rehab care because she's switching ob/gyn's and needs a provider to view her progress in the interim? Please advise. Willing to do appointment if necessary.

## 2019-03-26 NOTE — Addendum Note (Signed)
Addended by: Jerl Mina on: 03/26/2019 10:41 AM   Modules accepted: Orders

## 2019-03-26 NOTE — Telephone Encounter (Signed)
Spoke to patient and advised that Dr. Jonni Sanger is on vacation this entire week.  She is just looking for someone to receive reports from PT for pelvic floor dysfunction.  She states that the PT that she currently sees; she was referred to by her previous gyn doctor.  She decided today that she will d/c care w/Dr. Marvel Plan due to a major difference of opinion.  She is currently searching for a new doctor.

## 2019-03-27 ENCOUNTER — Ambulatory Visit (INDEPENDENT_AMBULATORY_CARE_PROVIDER_SITE_OTHER): Payer: No Typology Code available for payment source | Admitting: Psychology

## 2019-03-27 ENCOUNTER — Encounter: Payer: Self-pay | Admitting: Obstetrics and Gynecology

## 2019-03-27 DIAGNOSIS — F411 Generalized anxiety disorder: Secondary | ICD-10-CM | POA: Diagnosis not present

## 2019-03-27 DIAGNOSIS — F4321 Adjustment disorder with depressed mood: Secondary | ICD-10-CM | POA: Diagnosis not present

## 2019-03-29 ENCOUNTER — Ambulatory Visit: Payer: No Typology Code available for payment source | Admitting: Physical Therapy

## 2019-03-29 ENCOUNTER — Other Ambulatory Visit: Payer: Self-pay

## 2019-03-29 DIAGNOSIS — M6281 Muscle weakness (generalized): Secondary | ICD-10-CM

## 2019-03-29 DIAGNOSIS — M2170 Unequal limb length (acquired), unspecified site: Secondary | ICD-10-CM

## 2019-03-29 DIAGNOSIS — M62838 Other muscle spasm: Secondary | ICD-10-CM

## 2019-03-29 DIAGNOSIS — M6208 Separation of muscle (nontraumatic), other site: Secondary | ICD-10-CM

## 2019-03-29 NOTE — Patient Instructions (Signed)
Less is better with knee movement in Deep core level 2   __  Add posterior pelvic floor stretch   on belly   horseback riding position on belly,  knee slight off edge of the mattress, moving knee out / up towards ribs and back  10 reps    Child poses rocking with knees out slightly in "v"  10 reps

## 2019-03-29 NOTE — Therapy (Signed)
[place] [facility] [address] [place], [place], [address] Phone: [phone]   Fax:  [phone]  Physical Therapy Treatment  Patient Details  Name: Chelsea Collins MRN: [id number] Date of Birth: 04/30/1986 Referring Provider (PT): Asencion Partridgeamille Andy    Encounter Date: 03/29/2019  PT End of Session - 03/29/19 0823    Visit Number  5    Number of Visits  10    Date for PT Re-Evaluation  05/11/19    PT Start Time  0808    PT Stop Time  0904    PT Time Calculation (min)  56 min    Activity Tolerance  Patient tolerated treatment well;No increased pain       Past Medical History:  Diagnosis Date  . Anxiety disorder   . Encephalomalacia 03/21/2018   Occipital bilateral  . GAD (generalized anxiety disorder) 03/21/2018  . Heat intolerance   . Hypoglycemia   . Meningitis   . Postpartum depression 03/01/2019  . Seizures (HCC)    as an infant    Past Surgical History:  Procedure Laterality Date  . EYE SURGERY    . TYMPANOSTOMY TUBE PLACEMENT      There were no vitals filed for this visit.  Subjective Assessment - 03/29/19 0819    Subjective  Pt started her period last Monday and noticed period cramps in her pelvic area 4/10 and not in her low abdomen where she typical has period cramps prior to having baby. Today, this pelvic pain  has subsided to an ache1-2/10.  As a separate issue,  Pt noticed increased R hip pain and arm pain last week. Pt called her MD thinking it may be related to a side effect to one of her new medication.  Pain has resolved since MD took her off of this medication. This medication was for post-partum depression.    Patient Stated Goals  Working and walking with stroller and baby without hip, pelvic, thigh,  glut tightness and have intercourse without pain         [facility] PT Assessment - 03/29/19 0900      Posture/Postural Control   Posture Comments  improved deep core coordination, limited upward mobility of  pelvic floor ( Post Tx: increased)       Palpation   SI assessment   ASIS levelled, fascial restrictions over anterior/ lateral trunk                 Pelvic Floor Special Questions - 03/29/19 0859    Diastasis Recti  2 fingers width medial linea alba     External Perineal Exam  significant tensions at obt fossa/ ischiorectal fossa/ coccygeus B ( decreased post Tx)        [facility] Adult PT Treatment/Exercise - 03/29/19 0900      Neuro Re-ed    Neuro Re-ed Details   cued for deep core level 2       Moist Heat Therapy   Moist Heat Location  Other (comment)   perineum/ sacrum ( during HEP)      Manual Therapy   Manual therapy comments  STM/MWM at probelm areas noted in assessment                   PT Long Term Goals - 03/29/19 0905      PT LONG TERM GOAL #1   Title  independent with HEP and understand how to progress herself    Time  10  Period  Weeks    Status  Achieved      PT LONG TERM GOAL #2   Title  Pt will decrease her [place]  from 52 % to <25 % in order to improve QOL    Time  10    Period  Weeks    Status  On-going      PT LONG TERM GOAL #3   Title  Pt will demo proper pelvic floor lengthening, deep core coordination in order to decrease urinary frequency, pelvic pain, and improve bowel movements    Time  6    Period  Weeks    Status  On-going      PT LONG TERM GOAL #4   Title  Pt will demo equal alignment of pelvis and equal alignment iliac crest ( no anterior Lrotation) with compliance wearing shoe lift in R shoe   in order to be 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    Time  2    Period  Weeks    Status  Achieved      PT LONG TERM GOAL #5   Title  Pt will report no fecal / urine leakage across 1 month in order to participate in community activities and work duties    Time  8    Period  Weeks    Status  Achieved      PT LONG TERM GOAL #6   Title  Pt will demo proper body mechanics ( anterior tilt of pelvis in seating,  baby lifting/ carrying, walking with trunk rotation) in order to improve mobility of lumbopelvis, increase pelvic stability to perform functional activities without straining pelvic area    Time  6    Period  Weeks    Status  On-going      PT LONG TERM GOAL #7   Title  Pt will demo decreased abdominal separation from 3 fingers width along linea alba and 1 knuckle depth to < 1 finger width and decreased depth in order to increase intraabdominal pressure for multiple pelvic functions.    Time  4    Period  Weeks    Status  Achieved      PT LONG TERM GOAL #8   Title  Pt will decrease her NIH-CPSI score from 65% to < 30% in order to improve pelvic floor function and QOL    Time  7    Period  Weeks    Status  On-going            Plan - 03/29/19 0905    Clinical Impression Statement  Pt demo'd good carry over with less rounded shoulders and improved gait. Provided manual Tx to facilitate more closure to diastasis recti, decrease posterior pelvic floor tightness. Pt tolerated Tx without increased pain. Anticipate today's manual Tx will help pt's report of increased pelvic cramping that came on with her period this past week. Pt continues to benefit from skilled PT.    Personal Factors and Comorbidities  Past/Current Experience;Time since onset of injury/illness/exacerbation    Examination-Activity Limitations  Continence;Toileting;Other    Examination-Participation Restrictions  Community Activity    Stability/Clinical Decision Making  Evolving/Moderate complexity    Rehab Potential  Good    PT Frequency  1x / week    PT Duration  Other (comment)   10   PT Treatment/Interventions  Cryotherapy;Biofeedback;Moist Heat;Ultrasound;Therapeutic activities;Therapeutic exercise;Neuromuscular re-education;Manual techniques;Patient/family education;Scar mobilization;Dry needling    Consulted and Agree with Plan of Care  Patient       Patient will benefit from skilled therapeutic intervention in  order to improve the following deficits and impairments:  Pain, Increased fascial restricitons, Decreased coordination, Decreased mobility, Increased muscle spasms, Decreased activity tolerance, Decreased endurance, Decreased strength, Decreased knowledge of use of DME, Postural dysfunction, Improper body mechanics, Difficulty walking  Visit Diagnosis: 1. Muscle weakness (generalized)   2. Other muscle spasm   3. Leg length discrepancy   4. Diastasis recti        Problem List Patient Active Problem List   Diagnosis Date Noted  . Postpartum depression 03/01/2019  . Complex partial seizure (HCC) 03/21/2018  . Encephalomalacia 03/21/2018  . Anxiety disorder 03/21/2018  . Epilepsy (HCC) 01/30/2017  . Abnormal MRI of head 10/27/2015  . History of bacterial meningitis in infancy 10/27/2015    Mariane MastersYeung,Shin Yiing ,PT, DPT, E-RYT  03/29/2019, 12:36 PM  [place] [facility] [address] [place], [place], [address] Phone: [phone]   Fax:  [phone]  Name: Chelsea Collins MRN: [id number] Date of Birth: 09-04-1986

## 2019-04-02 ENCOUNTER — Telehealth: Payer: Self-pay | Admitting: Physical Therapy

## 2019-04-02 NOTE — Telephone Encounter (Signed)
See note  Copied from [place] [id number]. Topic: General - Other >> Apr 02, 2019 12:46 PM Rainey Pines A wrote: Patient returned Tiaras call about referral and would like callback once she returns from lunch.

## 2019-04-02 NOTE — Telephone Encounter (Signed)
Exactly, it is to supervise or cosign the PT evaluations. And I am not comfortable with that since I am not caring for her pelvic floor dysfunction.  I will not be able to provide that service. So can keep with current provider until establishes with new one (ordering provider can continue to sign off on PT notes/orders). Thanks.

## 2019-04-02 NOTE — Telephone Encounter (Signed)
DPT received an email from front desk staff to call pt. DPT spoke with pt and she reported she has noticed R hip area feels better after the past 2 sessions but it get tight again, pain level 3/10 after 1-2 days. It does go away before the next visit. Pt uses the heating pad and it goes away. Sometimes she feels a needling/ poking pain in her anal / vaginal area at intermittent pain 3/10.  DPT provided 20 min of education for approach and troubleshooting strategies to address her R hip and pelvic issues. Pt voiced understanding with repeat back communication.  Plan to see pt at her next session this Thursday.

## 2019-04-02 NOTE — Telephone Encounter (Signed)
Pt will need to see a different pelvic floor specialist. I cannot recommend this type of PT or take over care for this.   Thanks.

## 2019-04-02 NOTE — Telephone Encounter (Signed)
Called pt to make her aware, she states she does not need a new PT. She is in the process of switching to new OB/GYN and needing to have someone to receive her notes from PT until she has got established with new OB/GYN.

## 2019-04-02 NOTE — Telephone Encounter (Signed)
Pt called stating she is needing referral paperwork sent to her physical therapist. Pt states she is currently seeing physical therapist once a week, but she is requesting to change to 2x a week. Pt states she sees Physical therapist for her pelvis. Pt states she needs the referral to also state that PCP is taking over care and receiving updates. Pt states she needs the paper work sent to Dr. Aurea Graff at [facility] physical therapy and rehabilitation at [facility] regional. Fax # [phone]

## 2019-04-02 NOTE — Telephone Encounter (Signed)
Please advise 

## 2019-04-02 NOTE — Telephone Encounter (Signed)
See note

## 2019-04-02 NOTE — Telephone Encounter (Signed)
LMOVM for pt to return call 

## 2019-04-03 NOTE — Telephone Encounter (Signed)
Pt is aware and verbalized understanding.

## 2019-04-05 ENCOUNTER — Other Ambulatory Visit: Payer: Self-pay

## 2019-04-05 ENCOUNTER — Ambulatory Visit: Payer: No Typology Code available for payment source | Admitting: Physical Therapy

## 2019-04-05 DIAGNOSIS — M6281 Muscle weakness (generalized): Secondary | ICD-10-CM

## 2019-04-05 DIAGNOSIS — M2170 Unequal limb length (acquired), unspecified site: Secondary | ICD-10-CM

## 2019-04-05 DIAGNOSIS — M62838 Other muscle spasm: Secondary | ICD-10-CM

## 2019-04-05 DIAGNOSIS — M6208 Separation of muscle (nontraumatic), other site: Secondary | ICD-10-CM

## 2019-04-05 NOTE — Therapy (Signed)
[facility] [address] [place], [place], [address] Phone: [phone]   Fax:  [phone]  Physical Therapy Treatment  Patient Details  Name: Chelsea Collins MRN: [id number] Date of Birth: 03/30/1986 Referring Provider (PT): Asencion Partridgeamille Andy    Encounter Date: 04/05/2019  PT End of Session - 04/05/19 0936    Visit Number  6    Number of Visits  10    Date for PT Re-Evaluation  05/11/19    PT Start Time  0803    PT Stop Time  0905    PT Time Calculation (min)  62 min    Activity Tolerance  Patient tolerated treatment well;No increased pain       Past Medical History:  Diagnosis Date  . Anxiety disorder   . Encephalomalacia 03/21/2018   Occipital bilateral  . GAD (generalized anxiety disorder) 03/21/2018  . Heat intolerance   . Hypoglycemia   . Meningitis   . Postpartum depression 03/01/2019  . Seizures (HCC)    as an infant    Past Surgical History:  Procedure Laterality Date  . EYE SURGERY    . TYMPANOSTOMY TUBE PLACEMENT      There were no vitals filed for this visit.  Subjective Assessment - 04/05/19 0808    Subjective  Pt reported the R hip rewent away right after last session but increased R hip pain for 2 days. It has resolved. Ocassionally she has a wierd pain with a muscle in the back of L thigh but it is not like how it was before. The pelvic muscle spasms decreased 30%. Pt has felt increased pain with insertion during intercourse in the back pelvic floor mm    Patient Stated Goals  Working and walking with stroller and baby without hip, pelvic, thigh,  glut tightness and have intercourse without pain         [facility] - 04/05/19 0913      Assessment   Medical Diagnosis  slumped sitting      Observation/Other Assessments   Observations  slumped sitting. OKC: ankles in supination       Strength   Overall Strength Comments  heel raises required hand on wall       Palpation   SI assessment    tenderness/ tightness at tibofibilar joint -limited in DF       Ambulation/Gait   Gait Comments  trunk rotation, longer stride length                 Pelvic Floor Special Questions - 04/05/19 0854    Diastasis Recti  2 fingers width, 1 knuckle depth after Tx    External Perineal Exam  tenderness / tightness at L upper sacral / ilia , tightness at ischial tubersity/ tranverse perineal B         [facility] Adult PT Treatment/Exercise - 04/05/19 0913      Neuro Re-ed    Neuro Re-ed Details   cued for ankle alignment to minimize ankle sprain with disposition of supination/ toe in,   feet propioception, weight bearing, pelvic tilt anterior         Moist Heat Therapy   Moist Heat Location  --   sacrum/ perineum during deep core level 2      Manual Therapy   Manual therapy comments  STM/MWM at problem area noted in assessment to increase hip/ sacral/ pelvic mobility,     AP mob at TF joint L to increase  DF to minimize toe in / PF   Fascial pulling over abdomen to facilitate closure of diastsis recti                      PT Long Term Goals - 03/29/19 0905      PT LONG TERM GOAL #1   Title  independent with HEP and understand how to progress herself    Time  10    Period  Weeks    Status  Achieved      PT LONG TERM GOAL #2   Title  Pt will decrease her [place]  from 52 % to <25 % in order to improve QOL    Time  10    Period  Weeks    Status  On-going      PT LONG TERM GOAL #3   Title  Pt will demo proper pelvic floor lengthening, deep core coordination in order to decrease urinary frequency, pelvic pain, and improve bowel movements    Time  6    Period  Weeks    Status  On-going      PT LONG TERM GOAL #4   Title  Pt will demo equal alignment of pelvis and equal alignment iliac crest ( no anterior Lrotation) with compliance wearing shoe lift in R shoe   in order to be 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    Time  2    Period  Weeks     Status  Achieved      PT LONG TERM GOAL #5   Title  Pt will report no fecal / urine leakage across 1 month in order to participate in community activities and work duties    Time  8    Period  Weeks    Status  Achieved      PT LONG TERM GOAL #6   Title  Pt will demo proper body mechanics ( anterior tilt of pelvis in seating, baby lifting/ carrying, walking with trunk rotation) in order to improve mobility of lumbopelvis, increase pelvic stability to perform functional activities without straining pelvic area    Time  6    Period  Weeks    Status  On-going      PT LONG TERM GOAL #7   Title  Pt will demo decreased abdominal separation from 3 fingers width along linea alba and 1 knuckle depth to < 1 finger width and decreased depth in order to increase intraabdominal pressure for multiple pelvic functions.    Time  4    Period  Weeks    Status  Achieved      PT LONG TERM GOAL #8   Title  Pt will decrease her NIH-CPSI score from 65% to < 30% in order to improve pelvic floor function and QOL    Time  7    Period  Weeks    Status  On-going            Plan - 04/05/19 8657    Clinical Impression Statement  Pt demo'd decreased separation of abdomen mm, decreased posterior hip and pelvic floor mm tightness following external manual Tx.  Pt achieved increased stride length in gait and crossing leg over opposite thigh. Manual Tx applied at lower kinetic chain to increase DF and minimize supinationof B ankles/ toe in. By applying regional interdependent approach and addressing pt's leg length difference, toe in/ ankle supination, limited dorsiflexion in CKC, anticipate pt's pelvic  floor tightness will minimize risk for relapse. Plan to coninue also addressing perineal scar restrictions. Pt continues to maintain equal pelvic alignment with shoe lift.  Discussed flat insoles as another option to adjust for leg length difference to minimize toe irritation due to pt's toe in and ankle deviations.  Referred pt to a local gynecologist because [place].  Pt continues to benefit from skilled PT.    Personal Factors and Comorbidities  Past/Current Experience;Time since onset of injury/illness/exacerbation    Examination-Activity Limitations  Continence;Toileting;Other    Examination-Participation Restrictions  Community Activity    Stability/Clinical Decision Making  Evolving/Moderate complexity    Rehab Potential  Good    PT Frequency  1x / week    PT Duration  Other (comment)   10   PT Treatment/Interventions  Cryotherapy;Biofeedback;Moist Heat;Ultrasound;Therapeutic activities;Therapeutic exercise;Neuromuscular re-education;Manual techniques;Patient/family education;Scar mobilization;Dry needling    Consulted and Agree with Plan of Care  Patient       Patient will benefit from skilled therapeutic intervention in order to improve the following deficits and impairments:  Pain, Increased fascial restricitons, Decreased coordination, Decreased mobility, Increased muscle spasms, Decreased activity tolerance, Decreased endurance, Decreased strength, Decreased knowledge of use of DME, Postural dysfunction, Improper body mechanics, Difficulty walking  Visit Diagnosis: 1. Muscle weakness (generalized)   2. Other muscle spasm   3. Leg length discrepancy   4. Diastasis recti        Problem List Patient Active Problem List   Diagnosis Date Noted  . Postpartum depression 03/01/2019  . Complex partial seizure (HCC) 03/21/2018  . Encephalomalacia 03/21/2018  . Anxiety disorder 03/21/2018  . Epilepsy (HCC) 01/30/2017  . Abnormal MRI of head 10/27/2015  . History of bacterial meningitis in infancy 10/27/2015    Mariane MastersYeung,Shin Yiing ,PT, DPT, E-RYT  04/05/2019, 9:52 AM  [place] [facility] [address] [place], [place], [address] Phone: [phone]   Fax:  [phone]  Name: Chelsea Collins MRN:  [id number] Date of Birth: 11-Dec-1985

## 2019-04-05 NOTE — Patient Instructions (Addendum)
Sitting with Feet firm under knees,  sitting bones, slight anterior tilt of pelvis , not on tail bone   diaphragm stacked over pelvis    Folded towel in bucket seat to minimize sacral sitting    ____  Heel raises with hand on wall  20 reps   ____   Chelsea Collins stretch with hands back, anterior tilt of pelvis

## 2019-04-06 MED FILL — MIRTAZAPINE 15 MG TABLET: 15 | 90 days supply | Qty: 90 | Fill #0

## 2019-04-09 ENCOUNTER — Ambulatory Visit (INDEPENDENT_AMBULATORY_CARE_PROVIDER_SITE_OTHER): Payer: No Typology Code available for payment source | Admitting: Psychology

## 2019-04-09 DIAGNOSIS — F411 Generalized anxiety disorder: Secondary | ICD-10-CM | POA: Diagnosis not present

## 2019-04-09 DIAGNOSIS — F4321 Adjustment disorder with depressed mood: Secondary | ICD-10-CM

## 2019-04-12 ENCOUNTER — Other Ambulatory Visit: Payer: Self-pay

## 2019-04-12 ENCOUNTER — Ambulatory Visit: Payer: No Typology Code available for payment source | Admitting: Physical Therapy

## 2019-04-12 DIAGNOSIS — M2170 Unequal limb length (acquired), unspecified site: Secondary | ICD-10-CM

## 2019-04-12 DIAGNOSIS — M62838 Other muscle spasm: Secondary | ICD-10-CM

## 2019-04-12 DIAGNOSIS — M6281 Muscle weakness (generalized): Secondary | ICD-10-CM | POA: Diagnosis not present

## 2019-04-12 DIAGNOSIS — M6208 Separation of muscle (nontraumatic), other site: Secondary | ICD-10-CM

## 2019-04-12 NOTE — Therapy (Signed)
[facility] [address] [place], [place], [address] Phone: [phone]   Fax:  [phone]  Physical Therapy Treatment  Patient Details  Name: Chelsea Collins MRN: [id number] Date of Birth: August 11, 1986 Referring Provider (PT): Asencion Partridgeamille Andy    Encounter Date: 04/12/2019  PT End of Session - 04/12/19 1006    Visit Number  7    Number of Visits  10    Date for PT Re-Evaluation  05/11/19    PT Start Time  0800    PT Stop Time  0910    PT Time Calculation (min)  70 min    Activity Tolerance  Patient tolerated treatment well;No increased pain       Past Medical History:  Diagnosis Date  . Anxiety disorder   . Encephalomalacia 03/21/2018   Occipital bilateral  . GAD (generalized anxiety disorder) 03/21/2018  . Heat intolerance   . Hypoglycemia   . Meningitis   . Postpartum depression 03/01/2019  . Seizures (HCC)    as an infant    Past Surgical History:  Procedure Laterality Date  . EYE SURGERY    . TYMPANOSTOMY TUBE PLACEMENT      There were no vitals filed for this visit.  Subjective Assessment - 04/12/19 1005    Subjective  Pt reports no more R hip pain the past week. Pt felt sore after last session but it went away after 2 days. Pt noticed leakage has returned.    Patient Stated Goals  Working and walking with stroller and baby without hip, pelvic, thigh,  glut tightness and have intercourse without pain         [facility] Assessment - 04/12/19 0923      Strength   Overall Strength Comments  hip abd B 4+/5, hip ext 4+/5 B, ankle DF/EV R 4+/5, PF/INV 4+/5                Pelvic Floor Special Questions - 04/12/19 1003    Diastasis Recti  no knuckle depth, more closure noted      External Perineal Exam  tenderness / tightness at deep/ superficial transverse perineal mm          [facility] Adult PT Treatment/Exercise - 04/12/19 [id number]      Ambulation/Gait   Assistive device  Other (Comment)   hiking  poles, cued for trunk rotation / arm swings 100 ft    Gait Comments  minimal trunk rotation and arm swings. longer stride, more upright/ less forward head posture, R toe in. ( Post Tx: improved and less toe in on R)   tactile cues w/ band for anterior COM/ more weight bearing     Therapeutic Activites    Therapeutic Activities  Other Therapeutic Activities    Other Therapeutic Activities  gait training with hiking poles, resistance  10'       Neuro Re-ed    Neuro Re-ed Details   excessive cues for more push off, trunk rotation, arm swing and anterior COM with gait , minor  cued for pelvic floor contraction      Moist Heat Therapy   Number Minutes Moist Heat  6 Minutes    Moist Heat Location  --   perineum through clothing      Manual Therapy   Manual therapy comments  STM/MWM externally through clothing at transverse perineal mm superficial and deep  PT Long Term Goals - 03/29/19 0905      PT LONG TERM GOAL #1   Title  independent with HEP and understand how to progress herself    Time  10    Period  Weeks    Status  Achieved      PT LONG TERM GOAL #2   Title  Pt will decrease her [place]  from 52 % to <25 % in order to improve QOL    Time  10    Period  Weeks    Status  On-going      PT LONG TERM GOAL #3   Title  Pt will demo proper pelvic floor lengthening, deep core coordination in order to decrease urinary frequency, pelvic pain, and improve bowel movements    Time  6    Period  Weeks    Status  On-going      PT LONG TERM GOAL #4   Title  Pt will demo equal alignment of pelvis and equal alignment iliac crest ( no anterior Lrotation) with compliance wearing shoe lift in R shoe   in order to be 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    Time  2    Period  Weeks    Status  Achieved      PT LONG TERM GOAL #5   Title  Pt will report no fecal / urine leakage across 1 month in order to participate in community activities and work  duties    Time  8    Period  Weeks    Status  Achieved      PT LONG TERM GOAL #6   Title  Pt will demo proper body mechanics ( anterior tilt of pelvis in seating, baby lifting/ carrying, walking with trunk rotation) in order to improve mobility of lumbopelvis, increase pelvic stability to perform functional activities without straining pelvic area    Time  6    Period  Weeks    Status  On-going      PT LONG TERM GOAL #7   Title  Pt will demo decreased abdominal separation from 3 fingers width along linea alba and 1 knuckle depth to < 1 finger width and decreased depth in order to increase intraabdominal pressure for multiple pelvic functions.    Time  4    Period  Weeks    Status  Achieved      PT LONG TERM GOAL #8   Title  Pt will decrease her NIH-CPSI score from 65% to < 30% in order to improve pelvic floor function and QOL    Time  7    Period  Weeks    Status  On-going            Plan - 04/12/19 1005    Clinical Impression Statement  Pt 's R hip has resolved. Addressed today tightness at deep/superficial transverse perineal with manual Tx which pt tolerated without pain.  Pt demo'd proper pelvic floor contractions ( quick) after cues to minimize accessory mm. Anticipate this improvement will help with decreasing urinary leakage. Pt demo'd increased deep core and hip strength with decreased diastasis recti which indicated increased intraabdominal pressure and postural stability. Addressed R toe in which is less related to hip weakness/ ankle DF/EV weakness  as pt showed increased hip abduction / ankle DF/ EV strength and more related to decreased mobility with toe abduction, decreased co-activation of transverse arch with tendency to supinate. Pt demo'd more  co-activaton of transverse arch with excessive cues and  gait mechanics training to increase trunk rotation/ arm swings. Pt continues to benefit from skilled PT with regional interdependent approach 2/2 to leg length difference  and lower kinetic chain deviations.      Personal Factors and Comorbidities  Past/Current Experience;Time since onset of injury/illness/exacerbation    Examination-Activity Limitations  Continence;Toileting;Other    Examination-Participation Restrictions  Community Activity    Stability/Clinical Decision Making  Evolving/Moderate complexity    Rehab Potential  Good    PT Frequency  1x / week    PT Duration  Other (comment)   10   PT Treatment/Interventions  Cryotherapy;Biofeedback;Moist Heat;Ultrasound;Therapeutic activities;Therapeutic exercise;Neuromuscular re-education;Manual techniques;Patient/family education;Scar mobilization;Dry needling    Consulted and Agree with Plan of Care  Patient       Patient will benefit from skilled therapeutic intervention in order to improve the following deficits and impairments:  Pain, Increased fascial restricitons, Decreased coordination, Decreased mobility, Increased muscle spasms, Decreased activity tolerance, Decreased endurance, Decreased strength, Decreased knowledge of use of DME, Postural dysfunction, Improper body mechanics, Difficulty walking  Visit Diagnosis: 1. Muscle weakness (generalized)   2. Other muscle spasm   3. Leg length discrepancy   4. Diastasis recti        Problem List Patient Active Problem List   Diagnosis Date Noted  . Postpartum depression 03/01/2019  . Complex partial seizure (HCC) 03/21/2018  . Encephalomalacia 03/21/2018  . Anxiety disorder 03/21/2018  . Epilepsy (HCC) 01/30/2017  . Abnormal MRI of head 10/27/2015  . History of bacterial meningitis in infancy 10/27/2015    Mariane MastersYeung,Shin Yiing  ,PT, DPT, E-RYT  04/12/2019, 11:14 AM  [facility] [address] [place], [place], [address] Phone: [phone]   Fax:  [phone]  Name: Chelsea Collins MRN: [id number] Date of Birth: Jun 05, 1986

## 2019-04-12 NOTE — Patient Instructions (Signed)
Deep core level 1 with  In hale, pause  Exhale, pause   quick "blue berry" squeeze ( pelvic floor)   10 reps   ___  Deep core level 2  ___  Pelvic floor stretches    ___ Walking with less bags over your shoulders  Allow for arm swings, and midback rotations Wider ballmounds and press harder in to the ground  Longer stride   ___  Add lunge/ knee bend, ballmounds wide after heel raise  ( calf stretch)  10 reps

## 2019-04-19 ENCOUNTER — Encounter: Payer: No Typology Code available for payment source | Admitting: Physical Therapy

## 2019-04-23 ENCOUNTER — Ambulatory Visit (INDEPENDENT_AMBULATORY_CARE_PROVIDER_SITE_OTHER): Payer: No Typology Code available for payment source | Admitting: Psychology

## 2019-04-23 DIAGNOSIS — F4321 Adjustment disorder with depressed mood: Secondary | ICD-10-CM

## 2019-04-23 DIAGNOSIS — F411 Generalized anxiety disorder: Secondary | ICD-10-CM

## 2019-04-26 ENCOUNTER — Encounter: Payer: No Typology Code available for payment source | Admitting: Obstetrics and Gynecology

## 2019-04-26 ENCOUNTER — Other Ambulatory Visit: Payer: Self-pay

## 2019-04-26 ENCOUNTER — Ambulatory Visit
Payer: No Typology Code available for payment source | Attending: Obstetrics and Gynecology | Admitting: Physical Therapy

## 2019-04-26 DIAGNOSIS — M6281 Muscle weakness (generalized): Secondary | ICD-10-CM | POA: Insufficient documentation

## 2019-04-26 DIAGNOSIS — M6208 Separation of muscle (nontraumatic), other site: Secondary | ICD-10-CM | POA: Diagnosis present

## 2019-04-26 DIAGNOSIS — M4125 Other idiopathic scoliosis, thoracolumbar region: Secondary | ICD-10-CM | POA: Diagnosis not present

## 2019-04-26 DIAGNOSIS — M62838 Other muscle spasm: Secondary | ICD-10-CM | POA: Diagnosis present

## 2019-04-26 DIAGNOSIS — M2170 Unequal limb length (acquired), unspecified site: Secondary | ICD-10-CM | POA: Insufficient documentation

## 2019-04-26 NOTE — Patient Instructions (Addendum)
Pain science education Concepts Concepts in "Why Do I Still Hurt" By Dr. Donavan Foil   "danger in me" and "safety in me" concepts.     _____________    Minisquat with LIFTING baby or to lower and get up from getting something from the low cabinet  Wide feet Scoot buttocks back, hinge like you are looking at your reflection on a pond  Knees behind toes,  Inhale to "smell flowers"  Exhale on the rise "like rocket"  Do not lock knees, have more weight across ballmounds of feet, toes relaxed   ______  Lifting mechanics   Use feet for stability, semi tandem stance, avoid bending forward  Exhale as you lift    Place baby into carseat that is sitting on a secure chair behind you before getting out of the chair. This will give you a lighter load (less strain on pelvic floor)  when you rise against gravity from the chair.  ____________   As discussed about the nature of your L hip pain that occurred when you went back to work, it is likely these muscles had not been used when you were resting with COVID infection. You stated you did not walk very much during the 2 weeks of rest and then all of a sudden, when you returned to work, you had to be walking all day long. This sudden change in being upright and active can cause tightness in your hip muscles and the short lasting numbness in the area. Your body thanks you for having done your exercises and you are using the tools you have learned to address the tightness and pulling sensations you noticed. Deciding to perform stretches and using a heat pad was the appropriate thing you did to care for yourself. Give kudos to yourself for managing yourself!   Upon assessment today, your hip alignment has been maintained, your pelvic floor muscles are not as tight, and your sacroiliac joint and hips are moving with more range of motion without pain. Also your overall strength in your legs have increased in strength!  Your body thanks you for having done  your exercises prior to your COVID infection and thus these improvement have been maintained even with skipping 2 weeks of PT.  You are using the tools you have learned to address the tightness and pulling sensations you noticed.   You are getting better. Notice how to breathe and observe new sensations as they arise ( mindfulness) without labeling them good or bad, pleasure or unpleasant.  Do things that soothe you like getting a heating pad to fall back asleep. Remember the "danger in me" and "safety in me" concepts.    Also consider the fact that one of the symptoms to COVID is muscle aches.    We will discuss more about strategies for your anxiety that helped you in the past (as you stated how walking helped).

## 2019-04-27 NOTE — Therapy (Signed)
[place] MAIN [facility] SERVICES [place], [place], [address] Phone: [phone]   Fax:  [phone]  Physical Therapy Treatment/ Progress Note   Patient Details  Name: Chelsea Collins MRN: [id number] Date of Birth: October 22, 1985 Referring Provider (PT): Billey Chang    Encounter Date: 04/26/2019  PT End of Session - 04/26/19 0830    Visit Number  8    Number of Visits  10    Date for PT Re-Evaluation  05/11/19    PT Start Time  0800    PT Stop Time  0915    PT Time Calculation (min)  75 min    Activity Tolerance  Patient tolerated treatment well;No increased pain       Past Medical History:  Diagnosis Date  . Anxiety disorder   . Encephalomalacia 03/21/2018   Occipital bilateral  . GAD (generalized anxiety disorder) 03/21/2018  . Heat intolerance   . Hypoglycemia   . Meningitis   . Postpartum depression 03/01/2019  . Seizures ([place])    as an infant    Past Surgical History:  Procedure Laterality Date  . EYE SURGERY    . TYMPANOSTOMY TUBE PLACEMENT      There were no vitals filed for this visit.  Subjective Assessment - 04/26/19 0807    Subjective  Pt was tested postive for COVID 04/11/19 but did not have Sx until 04/13/19 ( sore throat which went away, mild cough that sounded different like phlegm in her chest that occurred at different times of the day, diarrhea).  Pt stayed home for 1.5 week and did not wear her shoe lift and did not walk much. Pt was able to performher exercises 5 days out of 7 days. Three days ago, pt noticed  pulling pain in the L hip while getting up from a squat and then it went away. Numbness along the L outer thigh occured after a stretch which last 30sec -1 min. Pt felt this was "wierd" and the numbness "freaked "her out. Pt was performing the Figure-4 stretch, pt was freaked out how there was a pulling discomfort lasted longer from evening to this morning. Pt decided to not do any strengthening exercises  but instead to to stretches instead and to put a heating pad on it.   1-2 days later, the pain started pulling/ contracting and getting tight around outer/ back of L hip and down her front thigh.    Patient Stated Goals  Working and walking with stroller and baby without hip, pelvic, thigh,  glut tightness and have intercourse without pain         [facility] PT Assessment - 04/27/19 0836      Squat   Comments  knee anterior over feet/ poor posterior kinetic chain co-activation      Floor to Stand   Comments  poor body mechanics with simulated task  of carrying baby       Other:   Other/ Comments  lifting with poor body mechanics       Palpation   SI assessment   iliac crest levelled standing, supine ASIS leveled                 Pelvic Floor Special Questions - 04/27/19 0835    Diastasis Recti  2 fingers width below sternum/ above umbilicus     External Perineal Exam  no tenderness / tightness at deep/ superficial transverse perineal mm          [facility]  Adult PT Treatment/Exercise - 04/27/19 0840      Therapeutic Activites    Other Therapeutic Activities  proper squat/ bending/ lifting and t/f from floor <> stand with baby, squat/ lifting body mechanics training       Neuro Re-ed    Neuro Re-ed Details   cued for proper squat/ bending/ lifting and t/f from floor <> stand with baby, squat/ lifting . Biopsychosocial approaches /neuroscience of pain education , discussed relaxation strategies for anxiety to minimize pain                    PT Long Term Goals - 04/27/19 0835      PT LONG TERM GOAL #1   Title  independent with HEP and understand how to progress herself    Time  10    Period  Weeks    Status  Achieved      PT LONG TERM GOAL #2   Title  Pt will decrease her [place]  from 52 % to <25 % in order to improve QOL    Time  10    Period  Weeks    Status  On-going      PT LONG TERM GOAL #3   Title  Pt will demo proper pelvic floor lengthening, deep core  coordination in order to decrease urinary frequency, pelvic pain, and improve bowel movements    Time  6    Period  Weeks    Status  Achieved      PT LONG TERM GOAL #4   Title  Pt will demo equal alignment of pelvis and equal alignment iliac crest ( no anterior Lrotation) with compliance wearing shoe lift in R shoe   in order to be 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    Time  2    Period  Weeks    Status  Achieved      PT LONG TERM GOAL #5   Title  Pt will report no fecal / urine leakage across 1 month in order to participate in community activities and work duties    Time  8    Period  Weeks    Status  On-going      PT LONG TERM GOAL #6   Title  Pt will demo proper body mechanics ( anterior tilt of pelvis in seating, baby lifting/ carrying, walking with trunk rotation, transfer floor<>stand) in order to improve mobility of lumbopelvis, increase pelvic stability to perform functional activities without straining pelvic area    Time  6    Period  Weeks    Status  Partially Met      PT LONG TERM GOAL #7   Title  Pt will demo decreased abdominal separation from 3 fingers width along linea alba and 1 knuckle depth to < 1 finger width and decreased depth in order to increase intraabdominal pressure for multiple pelvic functions.    Time  4    Period  Weeks    Status  Partially Met      PT LONG TERM GOAL #8   Title  Pt will decrease her NIH-CPSI score from 65% to < 30% in order to improve pelvic floor function and QOL    Time  7    Period  Weeks    Status  On-going            Plan - 04/27/19 0834    Clinical Impression Statement Pt has acheived 3/8 goals  and is progressing well towards her remaining goals across the past 8 sessions. Pt skipped PT for the past 2 weeks due to testing positive for COVID with mild Sx occurring 3 days after her test 04/11/19 ( cough, diarrhea, sore throat).   Prior to this hiatus from PT, pt had made great improvements with  decreased pelvic, back, and L hip  pain which was associated with her improvements with pelvic alignment with shoe lift 2/2 leg length discrepancy, decreased diastasis recti, significantly decreased pelvic floor and hip tightness, increased sacroiliac joint mobility, longer stride length in gait, increased trunk rotation in gait, and more upright posture, decreased perineal scar restrictions. . Urinary incontinence had been better but it returned the past 2 weeks prior to her COVID Dx.   Upon reassessment today close to 2 weeks after her quarantine of rest and minimal walking/ activity, pt showed good carry over of equal alignment of pelvic girdle, no more presence of pelvic floor/ hip mm tightness, longer stride length. Indicators of poor carry over include: Pt required reminders for increased trunk rotation and arm swings in gait and showed slight increased of diastasis recti. Provided body mechanics with bend/ squat/ lifting and floor <> stand transfers while carrying baby with simulation of task using 15lb kettlebell. Pt demo'd proper technique after training. Anticipate pt will improve with diastasis recti with these new practices to minimize straining abdomen/ back/ pelvic floor and maintain optimal intraabdominal pressure system in loaded and against gravity tasks.   Pt expressed frustration about not getting better. Pt was provided pain science education, motivational interviewing, encouragement to help pt understand about her concern about L hip tightness that occurred when she returned to work after her quarantine. Explained how inactivity followed by return to work can use mm to get tight. Also pt noticed numbness over L thigh which has subsided. Explained that one of COVID Sx can be muscle aches. Reinforced pt's self-care actions with stretches and use of heating pad are contributing to her getting better. Discussed calming nervous system with relaxation techniques to help minimize mm tightness and  manage new body sensations with less hyperviligance. Pt stated walking had helped her in the past when  she had anxiety attacks.   Plan to continue discussion next session and address diastasis recti, progress with strengthening. Pt continues to benefit from skilled PT. Pt 's frequency of sessions are increasing from once a week to 2x week to help pt make bigger gains using biospsychosocial approaches and strengthening due to pt's weakness 2/2 post-partum and leg length difference.                Personal Factors and Comorbidities  Past/Current Experience;Time since onset of injury/illness/exacerbation    Examination-Activity Limitations  Continence;Toileting;Other    Examination-Participation Restrictions  Community Activity    Stability/Clinical Decision Making  Evolving/Moderate complexity    Rehab Potential  Good    PT Frequency  2x / week    PT Duration  Other (comment)   10   PT Treatment/Interventions  Cryotherapy;Biofeedback;Moist Heat;Ultrasound;Therapeutic activities;Therapeutic exercise;Neuromuscular re-education;Manual techniques;Patient/family education;Scar mobilization;Dry needling    Consulted and Agree with Plan of Care  Patient       Patient will benefit from skilled therapeutic intervention in order to improve the following deficits and impairments:  Pain, Increased fascial restricitons, Decreased coordination, Decreased mobility, Increased muscle spasms, Decreased activity tolerance, Decreased endurance, Decreased strength, Decreased knowledge of use of DME, Postural dysfunction, Improper body mechanics, Difficulty walking  Visit Diagnosis: 1. Muscle  weakness (generalized)   2. Other muscle spasm   3. Leg length discrepancy   4. Diastasis recti        Problem List Patient Active Problem List   Diagnosis Date Noted  . Postpartum depression 03/01/2019  . Complex partial seizure ([place]) 03/21/2018  . Encephalomalacia 03/21/2018  . Anxiety disorder  03/21/2018  . Epilepsy ([place]) 01/30/2017  . Abnormal MRI of head 10/27/2015  . History of bacterial meningitis in infancy 10/27/2015    Jerl Mina ,PT, DPT, E-RYT  04/27/2019, 8:43 AM  [place] MAIN [facility] SERVICES [address] [place], [place], [address] Phone: [phone]   Fax:  [phone]  Name: Anthea Udovich MRN: [id number] Date of Birth: [birth year]

## 2019-05-01 ENCOUNTER — Other Ambulatory Visit: Payer: Self-pay

## 2019-05-01 ENCOUNTER — Ambulatory Visit: Payer: No Typology Code available for payment source | Admitting: Physical Therapy

## 2019-05-01 DIAGNOSIS — M6208 Separation of muscle (nontraumatic), other site: Secondary | ICD-10-CM

## 2019-05-01 DIAGNOSIS — M62838 Other muscle spasm: Secondary | ICD-10-CM

## 2019-05-01 DIAGNOSIS — M6281 Muscle weakness (generalized): Secondary | ICD-10-CM | POA: Diagnosis not present

## 2019-05-01 DIAGNOSIS — M2170 Unequal limb length (acquired), unspecified site: Secondary | ICD-10-CM

## 2019-05-01 NOTE — Therapy (Signed)
[place] MAIN [facility] SERVICES [address] [place], [place], [address] Phone: [phone]   Fax:  [phone]  Physical Therapy Treatment  Patient Details  Name: Chelsea Collins MRN: [id number] Date of Birth: December 08, 1985 Referring Provider (PT): Billey Chang    Encounter Date: 05/01/2019  PT End of Session - 05/01/19 1359    Visit Number  9    Date for PT Re-Evaluation  07/05/19    PT Start Time  0800    PT Stop Time  0905    PT Time Calculation (min)  65 min    Activity Tolerance  Patient tolerated treatment well;No increased pain       Past Medical History:  Diagnosis Date  . Anxiety disorder   . Encephalomalacia 03/21/2018   Occipital bilateral  . GAD (generalized anxiety disorder) 03/21/2018  . Heat intolerance   . Hypoglycemia   . Meningitis   . Postpartum depression 03/01/2019  . Seizures ([place])    as an infant    Past Surgical History:  Procedure Laterality Date  . EYE SURGERY    . TYMPANOSTOMY TUBE PLACEMENT      There were no vitals filed for this visit.  Subjective Assessment - 05/01/19 0804    Subjective  Pt reports she does her exercises with her husband which helps. Pt reported after doing the exercise from last session, she noticed the same looseness in the L knee just like  the R knee. What is hard for her is when she is doing her exercises , one day her hips feel loose but 2 days later, it gets tight in the hips, for example with clam shells.  Pt is frustrated about her urinary incontinence is back but it is only one type, incontinence with sneezing and coughing. Pt noticed during the 10 days she was resting and walked in her house barefoot, she did not notice as much of a leg length difference.    Patient Stated Goals  Working and walking with stroller and baby without hip, pelvic, thigh,  glut tightness and have intercourse without pain         [facility] PT Assessment - 05/01/19 0806      Observation/Other Assessments    Observations  more upright posture sitting without cues       Palpation   SI assessment   --    Palpation comment  tightness/ tenderness deep rotator mm B, sacrococcygeal ligament/ sacrotuberous ligament L ,  deep transverse perineal B       Ambulation/Gait   Gait Comments  good carry over with arm swings, longer stride without cues                 Pelvic Floor Special Questions - 05/01/19 0859    Diastasis Recti  2 fingers width below sternum/ above umbilicus, 1 fingers with head lift         [facility] Adult PT Treatment/Exercise - 05/01/19 0806      Therapeutic Activites    Other Therapeutic Activities  provided active listening, biopsychosocial approaches to pain management       Neuro Re-ed    Neuro Re-ed Details   cued for pelvic anterior tilt with tactile/ verbal / visual cues       Manual Therapy   Manual therapy comments  deep rotator mm B, sacrococcygeal ligament/ sacrotuberous ligament L ,  deep transverse perineal B with STM/ MWM  PT Long Term Goals - 04/27/19 0835      PT LONG TERM GOAL #1   Title  independent with HEP and understand how to progress herself    Time  10    Period  Weeks    Status  Achieved      PT LONG TERM GOAL #2   Title  Pt will decrease her [place]  from 52 % to <25 % in order to improve QOL    Time  10    Period  Weeks    Status  On-going      PT LONG TERM GOAL #3   Title  Pt will demo proper pelvic floor lengthening, deep core coordination in order to decrease urinary frequency, pelvic pain, and improve bowel movements    Time  6    Period  Weeks    Status  Achieved      PT LONG TERM GOAL #4   Title  Pt will demo equal alignment of pelvis and equal alignment iliac crest ( no anterior Lrotation) with compliance wearing shoe lift in R shoe   in order to be 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    Time  2    Period  Weeks    Status  Achieved      PT LONG TERM GOAL #5   Title  Pt  will report no fecal / urine leakage across 1 month in order to participate in community activities and work duties    Time  8    Period  Weeks    Status  On-going      PT LONG TERM GOAL #6   Title  Pt will demo proper body mechanics ( anterior tilt of pelvis in seating, baby lifting/ carrying, walking with trunk rotation, transfer floor<>stand) in order to improve mobility of lumbopelvis, increase pelvic stability to perform functional activities without straining pelvic area    Time  6    Period  Weeks    Status  Partially Met      PT LONG TERM GOAL #7   Title  Pt will demo decreased abdominal separation from 3 fingers width along linea alba and 1 knuckle depth to < 1 finger width and decreased depth in order to increase intraabdominal pressure for multiple pelvic functions.    Time  4    Period  Weeks    Status  Partially Met      PT LONG TERM GOAL #8   Title  Pt will decrease her NIH-CPSI score from 65% to < 30% in order to improve pelvic floor function and QOL    Time  7    Period  Weeks    Status  On-going            Plan - 05/01/19 1400    Clinical Impression Statement Pt continues to show good carry voer with pelvic girdle stability. Pt noticed during the 10 days she was resting and walked in her house barefoot, she did not notice as much of a leg length difference. Today,  further addressed increased posterior pelvic floor tightness and SIJ hypomobility to facilitate more sacral nutation and propioception training for anterior tilt of pelvis. Pt demo'd decreased tightness and tenderness at deeptransverse perineal mm B post Tx. Provided active listening and more biopsychosocial approaches for pain management. Pt starts twice weekly sessions this week. Anticipate twice a week sessions will accelerate her progress. Withheld kegel training due to presence of overactivity  of pelvic floor. Plan to progress kegel at later sessions to address SUI issue. Pt continues to benefit from  skilled PT.   Personal Factors and Comorbidities  Past/Current Experience;Time since onset of injury/illness/exacerbation    Examination-Activity Limitations  Continence;Toileting;Other    Examination-Participation Restrictions  Community Activity    Stability/Clinical Decision Making  Evolving/Moderate complexity    Rehab Potential  Good    PT Frequency  2x / week    PT Duration  Other (comment)   10   PT Treatment/Interventions  Cryotherapy;Biofeedback;Moist Heat;Ultrasound;Therapeutic activities;Therapeutic exercise;Neuromuscular re-education;Manual techniques;Patient/family education;Scar mobilization;Dry needling    Consulted and Agree with Plan of Care  Patient       Patient will benefit from skilled therapeutic intervention in order to improve the following deficits and impairments:  Pain, Increased fascial restricitons, Decreased coordination, Decreased mobility, Increased muscle spasms, Decreased activity tolerance, Decreased endurance, Decreased strength, Decreased knowledge of use of DME, Postural dysfunction, Improper body mechanics, Difficulty walking  Visit Diagnosis: 1. Muscle weakness (generalized)   2. Other muscle spasm   3. Leg length discrepancy   4. Diastasis recti        Problem List Patient Active Problem List   Diagnosis Date Noted  . Postpartum depression 03/01/2019  . Complex partial seizure ([place]) 03/21/2018  . Encephalomalacia 03/21/2018  . Anxiety disorder 03/21/2018  . Epilepsy ([place]) 01/30/2017  . Abnormal MRI of head 10/27/2015  . History of bacterial meningitis in infancy 10/27/2015    Jerl Mina 05/01/2019, 2:18 PM  [place] MAIN [facility] SERVICES [place], [place], [address] Phone: [phone]   Fax:  [phone]  Name: Chelsea Collins MRN: [id number] Date of Birth: November 26, 1985

## 2019-05-01 NOTE — Patient Instructions (Signed)
Anterior tilit in clam shells for better muscle length and less tension

## 2019-05-02 ENCOUNTER — Encounter: Payer: No Typology Code available for payment source | Admitting: Obstetrics and Gynecology

## 2019-05-03 ENCOUNTER — Ambulatory Visit: Payer: No Typology Code available for payment source | Admitting: Physical Therapy

## 2019-05-03 ENCOUNTER — Other Ambulatory Visit: Payer: Self-pay

## 2019-05-03 DIAGNOSIS — M2170 Unequal limb length (acquired), unspecified site: Secondary | ICD-10-CM

## 2019-05-03 DIAGNOSIS — M6208 Separation of muscle (nontraumatic), other site: Secondary | ICD-10-CM

## 2019-05-03 DIAGNOSIS — M6281 Muscle weakness (generalized): Secondary | ICD-10-CM

## 2019-05-03 DIAGNOSIS — M62838 Other muscle spasm: Secondary | ICD-10-CM

## 2019-05-03 NOTE — Therapy (Signed)
[place] MAIN [facility] SERVICES [address] [place], [place], [address] Phone: [phone]   Fax:  [phone]  Physical Therapy Treatment  Patient Details  Name: Chelsea Collins MRN: [id number] Date of Birth: 1985-10-13 Referring Provider (PT): Billey Chang    Encounter Date: 05/03/2019  PT End of Session - 05/03/19 0905    Visit Number  10    Date for PT Re-Evaluation  07/05/19    PT Start Time  0805    PT Stop Time  0900    PT Time Calculation (min)  55 min    Activity Tolerance  Patient tolerated treatment well;No increased pain       Past Medical History:  Diagnosis Date  . Anxiety disorder   . Encephalomalacia 03/21/2018   Occipital bilateral  . GAD (generalized anxiety disorder) 03/21/2018  . Heat intolerance   . Hypoglycemia   . Meningitis   . Postpartum depression 03/01/2019  . Seizures ([place])    as an infant    Past Surgical History:  Procedure Laterality Date  . EYE SURGERY    . TYMPANOSTOMY TUBE PLACEMENT      There were no vitals filed for this visit.  Subjective Assessment - 05/03/19 0833    Subjective  Pt reports the L hip pulls sometimes but not pain. It sort of stiff after work.    Patient Stated Goals  Working and walking with stroller and baby without hip, pelvic, thigh,  glut tightness and have intercourse without pain         [facility] PT Assessment - 05/03/19 0903      Posture/Postural Control   Posture Comments  increased control. minor cues for less slumping       Palpation   Spinal mobility  no increased mm tensions along paraspinals,     SI assessment   no SIJ hypomobility, alignment equal                 Pelvic Floor Special Questions - 05/03/19 0834    Diastasis Recti  less depth     External Perineal Exam  coordinated contraction with simulated cough         [facility] Adult PT Treatment/Exercise - 05/03/19 8016      Neuro Re-ed    Neuro Re-ed Details   cued for proper scap/ cerv  retraction with new band exercsies.       Exercises   Exercises  --   promote stronger thoracolumbar strength     Manual Therapy   Manual therapy comments  qadriped:  fascial pulling to decreased DRA                   PT Long Term Goals - 04/27/19 0835      PT LONG TERM GOAL #1   Title  independent with HEP and understand how to progress herself    Time  10    Period  Weeks    Status  Achieved      PT LONG TERM GOAL #2   Title  Pt will decrease her [place]  from 52 % to <25 % in order to improve QOL    Time  10    Period  Weeks    Status  On-going      PT LONG TERM GOAL #3   Title  Pt will demo proper pelvic floor lengthening, deep core coordination in order to decrease urinary frequency, pelvic pain, and improve bowel movements  Time  6    Period  Weeks    Status  Achieved      PT LONG TERM GOAL #4   Title  Pt will demo equal alignment of pelvis and equal alignment iliac crest ( no anterior Lrotation) with compliance wearing shoe lift in R shoe   in order to be 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    Time  2    Period  Weeks    Status  Achieved      PT LONG TERM GOAL #5   Title  Pt will report no fecal / urine leakage across 1 month in order to participate in community activities and work duties    Time  8    Period  Weeks    Status  On-going      PT LONG TERM GOAL #6   Title  Pt will demo proper body mechanics ( anterior tilt of pelvis in seating, baby lifting/ carrying, walking with trunk rotation, transfer floor<>stand) in order to improve mobility of lumbopelvis, increase pelvic stability to perform functional activities without straining pelvic area    Time  6    Period  Weeks    Status  Partially Met      PT LONG TERM GOAL #7   Title  Pt will demo decreased abdominal separation from 3 fingers width along linea alba and 1 knuckle depth to < 1 finger width and decreased depth in order to increase intraabdominal pressure for multiple  pelvic functions.    Time  4    Period  Weeks    Status  Partially Met      PT LONG TERM GOAL #8   Title  Pt will decrease her NIH-CPSI score from 65% to < 30% in order to improve pelvic floor function and QOL    Time  7    Period  Weeks    Status  On-going            Plan - 05/03/19 0905    Clinical Impression Statement  Pt progressed to resistance band exercises to strengthen thoracolumbar system today as pt no longer showed increased back / hip/ pelvic floor mm tensions. Pt required cervical retraction/ scapular depression and demo'd IND following training. Added coordination of pelvic floor contraction with simulated cough to minimize SUI. Pt is advancing towards goals with skilled PT.    Personal Factors and Comorbidities  Past/Current Experience;Time since onset of injury/illness/exacerbation    Examination-Activity Limitations  Continence;Toileting;Other    Examination-Participation Restrictions  Community Activity    Stability/Clinical Decision Making  Evolving/Moderate complexity    Rehab Potential  Good    PT Frequency  2x / week    PT Duration  Other (comment)   10   PT Treatment/Interventions  Cryotherapy;Biofeedback;Moist Heat;Ultrasound;Therapeutic activities;Therapeutic exercise;Neuromuscular re-education;Manual techniques;Patient/family education;Scar mobilization;Dry needling    Consulted and Agree with Plan of Care  Patient       Patient will benefit from skilled therapeutic intervention in order to improve the following deficits and impairments:  Pain, Increased fascial restricitons, Decreased coordination, Decreased mobility, Increased muscle spasms, Decreased activity tolerance, Decreased endurance, Decreased strength, Decreased knowledge of use of DME, Postural dysfunction, Improper body mechanics, Difficulty walking  Visit Diagnosis: 1. Muscle weakness (generalized)   2. Other muscle spasm   3. Leg length discrepancy   4. Diastasis recti         Problem List Patient Active Problem List   Diagnosis Date Noted  .  Postpartum depression 03/01/2019  . Complex partial seizure ([place]) 03/21/2018  . Encephalomalacia 03/21/2018  . Anxiety disorder 03/21/2018  . Epilepsy ([place]) 01/30/2017  . Abnormal MRI of head 10/27/2015  . History of bacterial meningitis in infancy 10/27/2015    Jerl Mina ,PT, DPT, E-RYT  05/03/2019, 9:06 AM  [place] MAIN [facility] SERVICES [address] [place], [place], [address] Phone: [phone]   Fax:  [phone]  Name: Leilynn Pilat MRN: [id number] Date of Birth: 1986-05-02

## 2019-05-03 NOTE — Patient Instructions (Addendum)
   band under ballmounds  while laying on back w/ knees bent  "W" exercise  10 reps x 2 sets   Band is placed under feet, knees bent, feet are hip width apart Hold band with thumbs point out, keep upper arm and elbow touching the bed the whole time  - inhale and then exhale pull bands by bending elbows hands move in a "w"  (feel shoulder blades squeezing)   COMPLIMENTARY STRETCH:  angel wings, dragging on bed, do not lift arm up  10 reps _________________  Seated: hip and shoulder strengthening    Bands under feet,    Band on outside of thigh, switch handles into opp hand,    Elbow by your side, they do not move   Chin tuck    Inhale, exhale, pull band with thumb out     10 reps  COMPLIMENTARY STRETCH: Hands on shoulders, elbow circle backwards  To open chest   10 reps    ___  Coordination quick pelvic floor strength with coughing, laughing, sneezing

## 2019-05-08 ENCOUNTER — Other Ambulatory Visit: Payer: Self-pay

## 2019-05-08 ENCOUNTER — Ambulatory Visit: Payer: No Typology Code available for payment source | Admitting: Physical Therapy

## 2019-05-08 ENCOUNTER — Ambulatory Visit (INDEPENDENT_AMBULATORY_CARE_PROVIDER_SITE_OTHER): Payer: No Typology Code available for payment source | Admitting: Psychology

## 2019-05-08 DIAGNOSIS — M6281 Muscle weakness (generalized): Secondary | ICD-10-CM | POA: Diagnosis not present

## 2019-05-08 DIAGNOSIS — M2170 Unequal limb length (acquired), unspecified site: Secondary | ICD-10-CM

## 2019-05-08 DIAGNOSIS — M6208 Separation of muscle (nontraumatic), other site: Secondary | ICD-10-CM

## 2019-05-08 DIAGNOSIS — F411 Generalized anxiety disorder: Secondary | ICD-10-CM

## 2019-05-08 DIAGNOSIS — F4321 Adjustment disorder with depressed mood: Secondary | ICD-10-CM

## 2019-05-08 DIAGNOSIS — M62838 Other muscle spasm: Secondary | ICD-10-CM

## 2019-05-08 NOTE — Patient Instructions (Signed)
Continue with chin tucks/ shoulders back and down  The exercises we did today will not be part of your home program Continue to focus on the ones from last week and maintain stretches

## 2019-05-08 NOTE — Therapy (Signed)
[place] MAIN [facility] SERVICES [address] [place], [place], [address] Phone: [phone]   Fax:  [phone]  Physical Therapy Treatment  Patient Details  Name: Chelsea Collins MRN: [id number] Date of Birth: 10/17/1985 Referring Provider (PT): Billey Chang    Encounter Date: 05/08/2019  PT End of Session - 05/08/19 0854    Visit Number  11    Date for PT Re-Evaluation  07/05/19    PT Start Time  0808    PT Stop Time  0901    PT Time Calculation (min)  53 min    Activity Tolerance  Patient tolerated treatment well;No increased pain       Past Medical History:  Diagnosis Date  . Anxiety disorder   . Encephalomalacia 03/21/2018   Occipital bilateral  . GAD (generalized anxiety disorder) 03/21/2018  . Heat intolerance   . Hypoglycemia   . Meningitis   . Postpartum depression 03/01/2019  . Seizures ([place])    as an infant    Past Surgical History:  Procedure Laterality Date  . EYE SURGERY    . TYMPANOSTOMY TUBE PLACEMENT      There were no vitals filed for this visit.  Subjective Assessment - 05/08/19 0810    Subjective  Pt had no discomfort with the new exercises.    Patient Stated Goals  Working and walking with stroller and baby without hip, pelvic, thigh,  glut tightness and have intercourse without pain         [facility] PT Assessment - 05/08/19 0835      Observation/Other Assessments   Observations  forward head position, excessive cues for scap depression/ cervical retraction      Other:   Other/ Comments  poor propioception of knees and weight bearing through legs with poor diassociation of trunk/ pelvis       Palpation   Palpation comment  increased tightness at upper trap B / scalenes posterior R                    [facility] Adult PT Treatment/Exercise - 05/08/19 1062      Neuro Re-ed    Neuro Re-ed Details   cued for new resistance exercises. excessive cues for propioception of kneses and weight bearing  through legs with diassociation of trunk/ pelvis       Moist Heat Therapy   Number Minutes Moist Heat  5 Minutes    Moist Heat Location  Cervical      Manual Therapy   Manual therapy comments  STM/MWM at upper trap/ scalenes                   PT Long Term Goals - 04/27/19 0835      PT LONG TERM GOAL #1   Title  independent with HEP and understand how to progress herself    Time  10    Period  Weeks    Status  Achieved      PT LONG TERM GOAL #2   Title  Pt will decrease her [place]  from 52 % to <25 % in order to improve QOL    Time  10    Period  Weeks    Status  On-going      PT LONG TERM GOAL #3   Title  Pt will demo proper pelvic floor lengthening, deep core coordination in order to decrease urinary frequency, pelvic pain, and improve bowel movements    Time  6  Period  Weeks    Status  Achieved      PT LONG TERM GOAL #4   Title  Pt will demo equal alignment of pelvis and equal alignment iliac crest ( no anterior Lrotation) with compliance wearing shoe lift in R shoe   in order to be 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    Time  2    Period  Weeks    Status  Achieved      PT LONG TERM GOAL #5   Title  Pt will report no fecal / urine leakage across 1 month in order to participate in community activities and work duties    Time  8    Period  Weeks    Status  On-going      PT LONG TERM GOAL #6   Title  Pt will demo proper body mechanics ( anterior tilt of pelvis in seating, baby lifting/ carrying, walking with trunk rotation, transfer floor<>stand) in order to improve mobility of lumbopelvis, increase pelvic stability to perform functional activities without straining pelvic area    Time  6    Period  Weeks    Status  Partially Met      PT LONG TERM GOAL #7   Title  Pt will demo decreased abdominal separation from 3 fingers width along linea alba and 1 knuckle depth to < 1 finger width and decreased depth in order to increase intraabdominal  pressure for multiple pelvic functions.    Time  4    Period  Weeks    Status  Partially Met      PT LONG TERM GOAL #8   Title  Pt will decrease her NIH-CPSI score from 65% to < 30% in order to improve pelvic floor function and QOL    Time  7    Period  Weeks    Status  On-going            Plan - 05/08/19 0854    Clinical Impression Statement  Pt advanced to thoracolumbar and lower kinetic chain strengtehning with red resistance band today but required excessive cues for propioception of knees to minimize genu valgus and propioception of neck/shoulders to minimize forward head. Withheld today's exercises from HEP as pt will need more repetition to retain propioception learning. Pt continues to benefit from skilled PT    Personal Factors and Comorbidities  Past/Current Experience;Time since onset of injury/illness/exacerbation    Examination-Activity Limitations  Continence;Toileting;Other    Examination-Participation Restrictions  Community Activity    Stability/Clinical Decision Making  Evolving/Moderate complexity    Rehab Potential  Good    PT Frequency  2x / week    PT Duration  Other (comment)   10   PT Treatment/Interventions  Cryotherapy;Biofeedback;Moist Heat;Ultrasound;Therapeutic activities;Therapeutic exercise;Neuromuscular re-education;Manual techniques;Patient/family education;Scar mobilization;Dry needling    Consulted and Agree with Plan of Care  Patient       Patient will benefit from skilled therapeutic intervention in order to improve the following deficits and impairments:  Pain, Increased fascial restricitons, Decreased coordination, Decreased mobility, Increased muscle spasms, Decreased activity tolerance, Decreased endurance, Decreased strength, Decreased knowledge of use of DME, Postural dysfunction, Improper body mechanics, Difficulty walking  Visit Diagnosis: 1. Muscle weakness (generalized)   2. Other muscle spasm   3. Leg length discrepancy   4.  Diastasis recti        Problem List Patient Active Problem List   Diagnosis Date Noted  . Postpartum depression 03/01/2019  .  Complex partial seizure ([place]) 03/21/2018  . Encephalomalacia 03/21/2018  . Anxiety disorder 03/21/2018  . Epilepsy ([place]) 01/30/2017  . Abnormal MRI of head 10/27/2015  . History of bacterial meningitis in infancy 10/27/2015    Jerl Mina ,PT, DPT, E-RYT  05/08/2019, 9:05 AM  [place] MAIN [facility] SERVICES [address] [place], [place], [address] Phone: [phone]   Fax:  [phone]  Name: Chelsea Collins MRN: [id number] Date of Birth: [birth year]

## 2019-05-10 ENCOUNTER — Other Ambulatory Visit: Payer: Self-pay

## 2019-05-10 ENCOUNTER — Ambulatory Visit: Payer: No Typology Code available for payment source | Admitting: Physical Therapy

## 2019-05-10 DIAGNOSIS — M2170 Unequal limb length (acquired), unspecified site: Secondary | ICD-10-CM

## 2019-05-10 DIAGNOSIS — M6281 Muscle weakness (generalized): Secondary | ICD-10-CM | POA: Diagnosis not present

## 2019-05-10 DIAGNOSIS — M62838 Other muscle spasm: Secondary | ICD-10-CM

## 2019-05-10 DIAGNOSIS — M6208 Separation of muscle (nontraumatic), other site: Secondary | ICD-10-CM

## 2019-05-10 NOTE — Therapy (Signed)
[place] [facility] MAIN REHAB SERVICES [address] [place], [place], [address] Phone: [phone]   Fax:  [phone]  Physical Therapy Treatment  Patient Details  Name: Chelsea Collins MRN: [id number] Date of Birth: 07/15/1986 Referring Provider (PT): Camille Andy    Encounter Date: 05/10/2019  PT End of Session - 05/10/19 1627    Visit Number  12    Date for PT Re-Evaluation  07/05/19    PT Start Time  0804    PT Stop Time  0905    PT Time Calculation (min)  61 min    Activity Tolerance  Patient tolerated treatment well;No increased pain       Past Medical History:  Diagnosis Date  . Anxiety disorder   . Encephalomalacia 03/21/2018   Occipital bilateral  . GAD (generalized anxiety disorder) 03/21/2018  . Heat intolerance   . Hypoglycemia   . Meningitis   . Postpartum depression 03/01/2019  . Seizures ([facility])    as an infant    Past Surgical History:  Procedure Laterality Date  . EYE SURGERY    . TYMPANOSTOMY TUBE PLACEMENT      There were no vitals filed for this visit.  Subjective Assessment - 05/10/19 0811    Subjective  Pt has not had any discomfort in hip and pelvic area for the past 2 weeks.Pt is having a hard time getting all the exercsies done because there are so many of them.  Pt is starting to apply for grad school.    Patient Stated Goals  Working and walking with stroller and baby without hip, pelvic, thigh,  glut tightness and have intercourse without pain         [facility] PT Assessment - 05/10/19 1624      Observation/Other Assessments   Observations  less cues for upright sitting. cued for more equal weight bearing on BLE , not leaning       Coordination   Gross Motor Movements are Fluid and Coordinated  --   less cues for scap retraction/ depression     Lunges   Comments  excessive tactile cues for knee alignment over ankle, alignment for back leg                    [facility] Adult PT Treatment/Exercise -  05/10/19 1631      Therapeutic Activites    Other Therapeutic Activities  co-created strategies for minimizing time with HEP to improve compliance due to pt's busy schedule.       Neuro Re-ed    Neuro Re-ed Details   cued for lower kinetic chain alignment and stability in new HEP                   PT Long Term Goals - 04/27/19 0835      PT LONG TERM GOAL #1   Title  independent with HEP and understand how to progress herself    Time  10    Period  Weeks    Status  Achieved      PT LONG TERM GOAL #2   Title  Pt will decrease her PDI  from 52 % to <25 % in order to improve QOL    Time  10    Period  Weeks    Status  On-going      PT LONG TERM GOAL #3   Title  Pt will demo proper pelvic floor lengthening, deep core coordination in order to decrease   urinary frequency, pelvic pain, and improve bowel movements    Time  6    Period  Weeks    Status  Achieved      PT LONG TERM GOAL #4   Title  Pt will demo equal alignment of pelvis and equal alignment iliac crest ( no anterior Lrotation) with compliance wearing shoe lift in R shoe   in order to be 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    Time  2    Period  Weeks    Status  Achieved      PT LONG TERM GOAL #5   Title  Pt will report no fecal / urine leakage across 1 month in order to participate in community activities and work duties    Time  8    Period  Weeks    Status  On-going      PT LONG TERM GOAL #6   Title  Pt will demo proper body mechanics ( anterior tilt of pelvis in seating, baby lifting/ carrying, walking with trunk rotation, transfer floor<>stand) in order to improve mobility of lumbopelvis, increase pelvic stability to perform functional activities without straining pelvic area    Time  6    Period  Weeks    Status  Partially Met      PT LONG TERM GOAL #7   Title  Pt will demo decreased abdominal separation from 3 fingers width along linea alba and 1 knuckle depth to < 1 finger width  and decreased depth in order to increase intraabdominal pressure for multiple pelvic functions.    Time  4    Period  Weeks    Status  Partially Met      PT LONG TERM GOAL #8   Title  Pt will decrease her NIH-CPSI score from 65% to < 30% in order to improve pelvic floor function and QOL    Time  7    Period  Weeks    Status  On-going            Plan - 05/10/19 1627    Clinical Impression Statement  Pt is progressing well with less cues required for cervical retraction, scapular retraction/depression in progressions of strengthening exercises. Consolidated exercises while advancing with dynamic balance positions in order to minimize amount of time for HEP which is the solution to help with pt's compliance. Pt required cues for knee alignment to minimize hip adductor/ pelvic overactivity in lunge position. Pt is still reqiring cues and modifications for dynamic balance. Pt continues to benefit from skilled PT.    Personal Factors and Comorbidities  Past/Current Experience;Time since onset of injury/illness/exacerbation    Examination-Activity Limitations  Continence;Toileting;Other    Examination-Participation Restrictions  Community Activity    Stability/Clinical Decision Making  Evolving/Moderate complexity    Rehab Potential  Good    PT Frequency  2x / week    PT Duration  Other (comment)   10   PT Treatment/Interventions  Cryotherapy;Biofeedback;Moist Heat;Ultrasound;Therapeutic activities;Therapeutic exercise;Neuromuscular re-education;Manual techniques;Patient/family education;Scar mobilization;Dry needling    Consulted and Agree with Plan of Care  Patient       Patient will benefit from skilled therapeutic intervention in order to improve the following deficits and impairments:  Pain, Increased fascial restricitons, Decreased coordination, Decreased mobility, Increased muscle spasms, Decreased activity tolerance, Decreased endurance, Decreased strength, Decreased knowledge of  use of DME, Postural dysfunction, Improper body mechanics, Difficulty walking  Visit Diagnosis: Muscle weakness (generalized)  Other muscle spasm    Leg length discrepancy  Diastasis recti     Problem List Patient Active Problem List   Diagnosis Date Noted  . Postpartum depression 03/01/2019  . Complex partial seizure ([facility]) 03/21/2018  . Encephalomalacia 03/21/2018  . Anxiety disorder 03/21/2018  . Epilepsy ([facility]) 01/30/2017  . Abnormal MRI of head 10/27/2015  . History of bacterial meningitis in infancy 10/27/2015    ,Shin Yiing ,PT, DPT, E-RYT  05/10/2019, 4:32 PM  [place] [facility] MAIN REHAB SERVICES [address] [place], [place], [address] Phone: [phone]   Fax:  [phone]  Name: Kaelani Dibella MRN: [id number] Date of Birth: 03/09/1986   

## 2019-05-15 ENCOUNTER — Ambulatory Visit: Payer: No Typology Code available for payment source | Admitting: Physical Therapy

## 2019-05-15 ENCOUNTER — Other Ambulatory Visit: Payer: Self-pay

## 2019-05-15 DIAGNOSIS — M6281 Muscle weakness (generalized): Secondary | ICD-10-CM

## 2019-05-15 DIAGNOSIS — M6208 Separation of muscle (nontraumatic), other site: Secondary | ICD-10-CM

## 2019-05-15 DIAGNOSIS — M62838 Other muscle spasm: Secondary | ICD-10-CM

## 2019-05-15 DIAGNOSIS — M2170 Unequal limb length (acquired), unspecified site: Secondary | ICD-10-CM

## 2019-05-15 NOTE — Patient Instructions (Signed)
  For the next 2 days until your Thurs appt  Please perform

## 2019-05-16 NOTE — Therapy (Addendum)
[place] MAIN [facility] SERVICES [address] [place], [place], [address] Phone: [phone]   Fax:  [phone]  Physical Therapy Treatment  Patient Details  Name: Chelsea Collins MRN: [id number] Date of Birth: [birth year] Referring Provider (PT): Billey Chang    Encounter Date: 05/15/2019  PT End of Session - 05/16/19 2145    Visit Number  14    Date for PT Re-Evaluation  07/05/19    PT Start Time  0804    PT Stop Time  0905    PT Time Calculation (min)  61 min    Activity Tolerance  Patient tolerated treatment well;No increased pain       Past Medical History:  Diagnosis Date  . Anxiety disorder   . Encephalomalacia 03/21/2018   Occipital bilateral  . GAD (generalized anxiety disorder) 03/21/2018  . Heat intolerance   . Hypoglycemia   . Meningitis   . Postpartum depression 03/01/2019  . Seizures ([place])    as an infant    Past Surgical History:  Procedure Laterality Date  . EYE SURGERY    . TYMPANOSTOMY TUBE PLACEMENT      There were no vitals filed for this visit.  Subjective Assessment - 05/16/19 2146    Subjective  Pt is finding the exercises are taking too long (1.5 hr long) . Pt finds the band exercises to be difficult for her balance and her husband has to be there to help her and he is not there to help her all the time.  Pt thinks if the strengthening / complimentary stretches are split to be in the afternoon, and then the pelvic floor stretches before bed.  Pt feels she is getting worse even though she is putting time in her exercises.  The issues she had right after the delivery of her baby was fecal leakage which resolved. Additional issues included pain with intercourse, urinary leakage, urgency, overactive bladder prior to starting PT here at [facility] which all resolved. When pt started PT here [facility], pt was dealing with L hip pain and decreased sensation/ numbness in the pelvic area.  When pt was working with the first Pelvic PT in  [place], pt was not able to move her pelvic muscles at all nor have sensation around the external genitalia. Pt is able to move the pelvic floor muscles with the "blueberry squeeze" exercise now over the weeks. Pt has to sometimes use a mirror or put her hand to area to make sure she is doing the exercise right.  W[place]oth intercourse, pt is not able to have sensation the area but this is no pain.  Pt's L hip pain has resolved for the past 3 weeks since she returned to work after having [place]. It has occurred briefly yesterday and it was not as severe. With a heating pad, it went away after 1 hour. Pt also noticed pulling crampy sensation from hip to close her tailbone and pt looked up the anatomy and thought "there is a way that the tailbone and the hip are connected".    Patient Stated Goals  Working and walking with stroller and baby without hip, pelvic, thigh,  glut tightness and have intercourse without pain         [facility] PT Assessment - 05/16/19 2220      Lunges   Comments  cued for more transverse arch coactivation in front foot to minimize LOB . no cues today for knee alignment  [place] Adult PT Treatment/Exercise - 05/16/19 2231      Therapeutic Activites    Other Therapeutic Activities  biopsychosocial strategies to recognize musculoskeletal improvements, explanation of progression of Tx, neuromuscular reeducation with proper alignment with shoe lift levelling pelvis/ balancing out pelvic floor tightness/ spinal hypomobility.        Neuro Re-ed    Neuro Re-ed Details   cued for proper feet alignment and co-activation of transverse arch                  PT Long Term Goals - 04/27/19 0835      PT LONG TERM GOAL #1   Title  independent with HEP and understand how to progress herself    Time  10    Period  Weeks    Status  Achieved      PT LONG TERM GOAL #2   Title  Pt will decrease her [place]  from 52 % to <25 % in order to improve QOL    Time   10    Period  Weeks    Status  On-going      PT LONG TERM GOAL #3   Title  Pt will demo proper pelvic floor lengthening, deep core coordination in order to decrease urinary frequency, pelvic pain, and improve bowel movements    Time  6    Period  Weeks    Status  Achieved      PT LONG TERM GOAL #4   Title  Pt will demo equal alignment of pelvis and equal alignment iliac crest ( no anterior Lrotation) with compliance wearing shoe lift in R shoe   in order to be 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    Time  2    Period  Weeks    Status  Achieved      PT LONG TERM GOAL #5   Title  Pt will report no fecal / urine leakage across 1 month in order to participate in community activities and work duties    Time  8    Period  Weeks    Status  On-going      PT LONG TERM GOAL #6   Title  Pt will demo proper body mechanics ( anterior tilt of pelvis in seating, baby lifting/ carrying, walking with trunk rotation, transfer floor<>stand) in order to improve mobility of lumbopelvis, increase pelvic stability to perform functional activities without straining pelvic area    Time  6    Period  Weeks    Status  Partially Met      PT LONG TERM GOAL #7   Title  Pt will demo decreased abdominal separation from 3 fingers width along linea alba and 1 knuckle depth to < 1 finger width and decreased depth in order to increase intraabdominal pressure for multiple pelvic functions.    Time  4    Period  Weeks    Status  Partially Met      PT LONG TERM GOAL #8   Title  Pt will decrease her NIH-CPSI score from 65% to < 30% in order to improve pelvic floor function and QOL    Time  7    Period  Weeks    Status  On-going            Plan - 05/16/19 2242    Clinical Impression Statement Biopsychosocial approaches were applied today to help provide positive reinforcement on pt's increased  awareness of  her body/ anatomy, musculoskeletal improvements, being able to self-manage when she  notices sensations around her sacrum and applying pain science principles ( Lorimer Mosely's  " Danger In Me" and "Safety In Me") and being able to self manage with a heating pad.   Provided cues for proper feet alignment and co-activation of transverse arch in lunge to minimize LOB and build confidence with exercise. Provided active listening to pt's concerns, progress, Sx, and request to modify her HEP to minimize the amount of time to complete them and decrease dependence on her husband to perform exercises correctly.  Chucked lunge/ resistance band exercise by withholding resistance band and focusing balance in lunge first given pt's poor propioception 2/2 untreated shorter leg length prior to undergoing current PT. Pt voiced understanding with use of transverse arch for more stability and proper knee alignment in lunge position. Pt demo'd correctly transitioning into and out of lunge with less LOB and more correct alignment.  Based on pt's feeling overwhelmed with new exercises, pt will fare better with downgrade on resistance band exercises and focusing on alignment and propioception first given pt's tendency to demo genu valgus and poor foot co-activation.  Pt's demo'd increased hip strength and less genu valgus in sit to stand, and less R toe-in in gait.    Plan to assess sensation of the perineum/ vulvar area to address her decreased sensation Sx.  Pt continues to benefit from skilled PT.         Personal Factors and Comorbidities  Past/Current Experience;Time since onset of injury/illness/exacerbation    Examination-Activity Limitations  Continence;Toileting;Other    Examination-Participation Restrictions  Community Activity    Stability/Clinical Decision Making  Evolving/Moderate complexity    Rehab Potential  Good    PT Frequency  2x / week    PT Duration  Other (comment)   10   PT Treatment/Interventions  Cryotherapy;Biofeedback;Moist Heat;Ultrasound;Therapeutic activities;Therapeutic  exercise;Neuromuscular re-education;Manual techniques;Patient/family education;Scar mobilization;Dry needling    Consulted and Agree with Plan of Care  Patient       Patient will benefit from skilled therapeutic intervention in order to improve the following deficits and impairments:  Pain, Increased fascial restricitons, Decreased coordination, Decreased mobility, Increased muscle spasms, Decreased activity tolerance, Decreased endurance, Decreased strength, Decreased knowledge of use of DME, Postural dysfunction, Improper body mechanics, Difficulty walking  Visit Diagnosis: Muscle weakness (generalized)  Other muscle spasm  Leg length discrepancy  Diastasis recti     Problem List Patient Active Problem List   Diagnosis Date Noted  . Postpartum depression 03/01/2019  . Complex partial seizure ([place]) 03/21/2018  . Encephalomalacia 03/21/2018  . Anxiety disorder 03/21/2018  . Epilepsy ([place]) 01/30/2017  . Abnormal MRI of head 10/27/2015  . History of bacterial meningitis in infancy 10/27/2015    Jerl Mina ,PT, DPT, E-RYT  05/16/2019, 10:43 PM  [place] MAIN [facility] SERVICES [address] [place], [place], [address] Phone: [phone]   Fax:  [phone]  Name: Chelsea Collins MRN: [id number] Date of Birth: 14-Oct-1985

## 2019-05-17 ENCOUNTER — Ambulatory Visit: Payer: No Typology Code available for payment source | Admitting: Physical Therapy

## 2019-05-17 ENCOUNTER — Other Ambulatory Visit: Payer: Self-pay

## 2019-05-17 DIAGNOSIS — M62838 Other muscle spasm: Secondary | ICD-10-CM

## 2019-05-17 DIAGNOSIS — M6208 Separation of muscle (nontraumatic), other site: Secondary | ICD-10-CM

## 2019-05-17 DIAGNOSIS — M6281 Muscle weakness (generalized): Secondary | ICD-10-CM | POA: Diagnosis not present

## 2019-05-17 DIAGNOSIS — M2170 Unequal limb length (acquired), unspecified site: Secondary | ICD-10-CM

## 2019-05-17 NOTE — Therapy (Signed)
[place] MAIN [facility] SERVICES [address] [place], [place], [address] Phone: [phone]   Fax:  [phone]  Physical Therapy Treatment  Patient Details  Name: Chelsea Collins MRN: [id number] Date of Birth: [birth year] Referring Provider (PT): Billey Chang    Encounter Date: 05/17/2019  PT End of Session - 05/17/19 0911    Visit Number  15    Date for PT Re-Evaluation  07/05/19    PT Start Time  0805    PT Stop Time  0905    PT Time Calculation (min)  60 min    Activity Tolerance  Patient tolerated treatment well;No increased pain    Behavior During Therapy  WFL for tasks assessed/performed       Past Medical History:  Diagnosis Date  . Anxiety disorder   . Encephalomalacia 03/21/2018   Occipital bilateral  . GAD (generalized anxiety disorder) 03/21/2018  . Heat intolerance   . Hypoglycemia   . Meningitis   . Postpartum depression 03/01/2019  . Seizures ([place])    as an infant    Past Surgical History:  Procedure Laterality Date  . EYE SURGERY    . TYMPANOSTOMY TUBE PLACEMENT      There were no vitals filed for this visit.  Subjective Assessment - 05/17/19 0806    Subjective  Pt noticed more stabilization problems with the lung when she not realizing she was not pushing her foot down.    Patient Stated Goals  Working and walking with stroller and baby without hip, pelvic, thigh,  glut tightness and have intercourse without pain         [facility] PT Assessment - 05/17/19 0936      Sensation   Additional Comments  Decreased sensation on L side of pelvic floor,  decreased distinction between dull/ sharp at L perineal S1.,2,3 and medial/ inferior cluneal pre Tx,   ( post Tx: increased sensation bilaterally, increased distinction at level of perineal S1.,2,3 but deficit at medial/ inferior cluneal S1,2,3        Lunges   Comments  less cues required for alignment and propioception                    [facility] Adult PT  Treatment/Exercise - 05/17/19 0315      Neuro Re-ed    Neuro Re-ed Details   cued for anterior pelvic tilt in sitting and supine, minor cues for standing lunges       Manual Therapy   Manual therapy comments  MWM with external technique at perineum and ischiorami / ischiotuberosity B . Medial mob at sacrospinal ligament and distal border sacrum L to facilitate nutation                 PT Long Term Goals - 05/17/19 1000      PT LONG TERM GOAL #1   Title  independent with HEP and understand how to progress herself    Time  10    Period  Weeks    Status  Achieved      PT LONG TERM GOAL #2   Title  Pt will decrease her [place]  from 52 % to <25 % in order to improve QOL    Time  10    Period  Weeks    Status  On-going      PT LONG TERM GOAL #3   Title  Pt will demo proper pelvic floor lengthening, deep core coordination in order to decrease  urinary frequency, pelvic pain, and improve bowel movements    Time  6    Period  Weeks    Status  Achieved      PT LONG TERM GOAL #4   Title  Pt will demo equal alignment of pelvis and equal alignment iliac crest ( no anterior Lrotation) with compliance wearing shoe lift in R shoe   in order to be 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    Time  2    Period  Weeks    Status  Achieved      PT LONG TERM GOAL #5   Title  Pt will report no fecal / urine leakage across 1 month in order to participate in community activities and work duties    Time  8    Period  Weeks    Status  On-going      PT LONG TERM GOAL #6   Title  Pt will demo proper body mechanics ( anterior tilt of pelvis in seating, baby lifting/ carrying, walking with trunk rotation, transfer floor<>stand) in order to improve mobility of lumbopelvis, increase pelvic stability to perform functional activities without straining pelvic area    Time  6    Period  Weeks    Status  Partially Met      PT LONG TERM GOAL #7   Title  Pt will demo decreased abdominal  separation from 3 fingers width along linea alba and 1 knuckle depth to < 1 finger width and decreased depth in order to increase intraabdominal pressure for multiple pelvic functions.    Time  4    Period  Weeks    Status  Partially Met      PT LONG TERM GOAL #8   Title  Pt will decrease her NIH-CPSI score from 65% to < 30% in order to improve pelvic floor function and QOL    Time  7    Period  Weeks    Status  On-going      PT LONG TERM GOAL  #9   TITLE  Pt will regain sensation with dull/ sharp at medial / inferior cluneal S1,2,3 on L pelvic floor in order to regain sensation for optimal pelvic floor function    Time  4    Period  Weeks    Status  New            Plan - 05/17/19 5597    Clinical Impression Statement  Pt demo'd good carry over with propioception and balance in lunges as she required less cuing. Assessed sensation at perineum today. Pt demo'd increased sensation B and also ability to sense dull and sharp at L perineal S1,2,3 but not at medial / inferior cluneal dermatome on L.  Manual Tx decreased fascial restrictions related to her 3rd degree perineal tear from L & D and increased mobility at L lateral border of distal sacrum. Reinforced propioception of anterior tilt of pelvis to minimize tightness of posterior pelvic floor and optimize perineal scar pliability. Pt demo'd proper sitting and upright posture with anterior tilit of pelvis. Recorded pt performing new perineal stretch with her phone with her permission to promote less confusion about exercise and better compliance to HEP with less overwhelm and frustration which pt demonstrated in the past.    Pt continues to benefit from skilled PT and is progressing well towards goals.    Personal Factors and Comorbidities  Past/Current Experience;Time since onset of injury/illness/exacerbation  Examination-Activity Limitations  Continence;Toileting;Other    Examination-Participation Restrictions  Community Activity     Stability/Clinical Decision Making  Evolving/Moderate complexity    Rehab Potential  Good    PT Frequency  2x / week    PT Duration  Other (comment)   10   PT Treatment/Interventions  Cryotherapy;Biofeedback;Moist Heat;Ultrasound;Therapeutic activities;Therapeutic exercise;Neuromuscular re-education;Manual techniques;Patient/family education;Scar mobilization;Dry needling    Consulted and Agree with Plan of Care  Patient       Patient will benefit from skilled therapeutic intervention in order to improve the following deficits and impairments:  Pain, Increased fascial restricitons, Decreased coordination, Decreased mobility, Increased muscle spasms, Decreased activity tolerance, Decreased endurance, Decreased strength, Decreased knowledge of use of DME, Postural dysfunction, Improper body mechanics, Difficulty walking  Visit Diagnosis: Muscle weakness (generalized)  Other muscle spasm  Leg length discrepancy  Diastasis recti     Problem List Patient Active Problem List   Diagnosis Date Noted  . Postpartum depression 03/01/2019  . Complex partial seizure ([place]) 03/21/2018  . Encephalomalacia 03/21/2018  . Anxiety disorder 03/21/2018  . Epilepsy ([place]) 01/30/2017  . Abnormal MRI of head 10/27/2015  . History of bacterial meningitis in infancy 10/27/2015    Jerl Mina ,PT, DPT, E-RYT  05/17/2019, 10:02 AM  [place] MAIN [facility] SERVICES [address] [place], [place], [address] Phone: [phone]   Fax:  [phone]  Name: Chelsea [place] MRN: [id number] Date of Birth: [birth year]

## 2019-05-17 NOTE — Patient Instructions (Signed)
without bands 5 breaths x 5 reps Keep practicing lunge 1) back foot and heel down 2) back foot  and heel lifted up   Gaze a head  50% weight in both legs Relax toe , spread ballmounds   ____  Mini squats x 5 reps  Rise with exhale   ____   Add one more pelvic floor stretch:  Ankles to the one side of the hip ( see your video)   ____  Sitting with more anterior tilt to open ribs and not slouch onto tailbone to stretch perineal scar and decrease tightness in the sacrum

## 2019-05-22 ENCOUNTER — Ambulatory Visit (INDEPENDENT_AMBULATORY_CARE_PROVIDER_SITE_OTHER): Payer: No Typology Code available for payment source | Admitting: Psychology

## 2019-05-22 ENCOUNTER — Ambulatory Visit
Payer: No Typology Code available for payment source | Attending: Obstetrics and Gynecology | Admitting: Physical Therapy

## 2019-05-22 ENCOUNTER — Other Ambulatory Visit: Payer: Self-pay

## 2019-05-22 DIAGNOSIS — M6208 Separation of muscle (nontraumatic), other site: Secondary | ICD-10-CM | POA: Insufficient documentation

## 2019-05-22 DIAGNOSIS — F411 Generalized anxiety disorder: Secondary | ICD-10-CM

## 2019-05-22 DIAGNOSIS — F4321 Adjustment disorder with depressed mood: Secondary | ICD-10-CM | POA: Diagnosis not present

## 2019-05-22 DIAGNOSIS — R2689 Other abnormalities of gait and mobility: Secondary | ICD-10-CM | POA: Insufficient documentation

## 2019-05-22 DIAGNOSIS — M2170 Unequal limb length (acquired), unspecified site: Secondary | ICD-10-CM | POA: Insufficient documentation

## 2019-05-22 DIAGNOSIS — M6281 Muscle weakness (generalized): Secondary | ICD-10-CM | POA: Insufficient documentation

## 2019-05-22 DIAGNOSIS — M62838 Other muscle spasm: Secondary | ICD-10-CM | POA: Diagnosis present

## 2019-05-22 NOTE — Therapy (Signed)
[place] MAIN [facility] SERVICES [address] [place], [place], [address] Phone: [phone]   Fax:  [phone]  Physical Therapy Treatment  Patient Details  Name: Chelsea Collins MRN: [id number] Date of Birth: 01/20/1986 Referring Provider (PT): Billey Chang    Encounter Date: 05/22/2019  PT End of Session - 05/22/19 2145    Visit Number  16    Date for PT Re-Evaluation  07/05/19    PT Start Time  0805    PT Stop Time  0903    PT Time Calculation (min)  58 min    Activity Tolerance  Patient tolerated treatment well;No increased pain    Behavior During Therapy  WFL for tasks assessed/performed       Past Medical History:  Diagnosis Date  . Anxiety disorder   . Encephalomalacia 03/21/2018   Occipital bilateral  . GAD (generalized anxiety disorder) 03/21/2018  . Heat intolerance   . Hypoglycemia   . Meningitis   . Postpartum depression 03/01/2019  . Seizures ([place])    as an infant    Past Surgical History:  Procedure Laterality Date  . EYE SURGERY    . TYMPANOSTOMY TUBE PLACEMENT      There were no vitals filed for this visit.  Subjective Assessment - 05/22/19 0906    Subjective  Pt reported 50% less tightness in pelvic floor muscles. L hip stopped hurting for a few days  after she stopped the new hip stretch but it returned today    Patient Stated Goals  Working and walking with stroller and baby without hip, pelvic, thigh,  glut tightness and have intercourse without pain         [facility] PT Assessment - 05/22/19 2129      Sensation   Additional Comments  bilateral sensation , no deficits between L, R and able to differentiate between dull and sharp along all dermatomes in pelvic area      Squat   Comments  good carry over with no cues       Palpation   Palpation comment       Ambulation/Gait   Gait Comments  required cues for arm swings, R toe in present .                 Pelvic Floor Special Questions - 05/22/19  2134    External Palpation  tightness/ tenderness at 3rd layer at obt int B , ( post Tx: no tenderness, decreased tensions B) . L SIJ at level S2 hypomobile and tender ( post Tx: increased SIJ mobility and no tenderness)          [facility] Adult PT Treatment/Exercise - 05/22/19 2132      Neuro Re-ed    Neuro Re-ed Details   cued for ant/post pelvic tilts in hooklying, upright       Manual Therapy   Manual therapy comments  superior glide at sacrum, pubic symphysis with MWM decrease tensions at deep pelvic floor L, increase SIJ mobility                    PT Long Term Goals - 05/22/19 2146      PT LONG TERM GOAL #1   Title  independent with HEP and understand how to progress herself    Time  10    Period  Weeks    Status  Achieved      PT LONG TERM GOAL #2   Title  Pt will  decrease her [place]  from 52 % to <25 % in order to improve QOL    Time  10    Period  Weeks    Status  On-going      PT LONG TERM GOAL #3   Title  Pt will demo proper pelvic floor lengthening, deep core coordination in order to decrease urinary frequency, pelvic pain, and improve bowel movements    Time  6    Period  Weeks    Status  Achieved      PT LONG TERM GOAL #4   Title  Pt will demo equal alignment of pelvis and equal alignment iliac crest ( no anterior Lrotation) with compliance wearing shoe lift in R shoe   in order to be 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    Time  2    Period  Weeks    Status  Achieved      PT LONG TERM GOAL #5   Title  Pt will report no fecal / urine leakage across 1 month in order to participate in community activities and work duties    Time  8    Period  Weeks    Status  On-going      Additional Long Term Goals   Additional Long Term Goals  Yes      PT LONG TERM GOAL #6   Title  Pt will demo proper body mechanics ( anterior tilt of pelvis in seating, baby lifting/ carrying, walking with trunk rotation, transfer floor<>stand) in order to improve  mobility of lumbopelvis, increase pelvic stability to perform functional activities without straining pelvic area    Time  6    Period  Weeks    Status  Partially Met      PT LONG TERM GOAL #7   Title  Pt will demo decreased abdominal separation from 3 fingers width along linea alba and 1 knuckle depth to < 1 finger width and decreased depth in order to increase intraabdominal pressure for multiple pelvic functions.    Time  4    Period  Weeks    Status  Achieved      PT LONG TERM GOAL #8   Title  Pt will decrease her NIH-CPSI score from 65% to < 30% in order to improve pelvic floor function and QOL    Time  7    Period  Weeks    Status  On-going      PT LONG TERM GOAL  #9   TITLE  Pt will regain sensation with dull/ sharp at medial / inferior cluneal S1,2,3 on L pelvic floor in order to regain sensation for optimal pelvic floor function    Time  4    Period  Weeks    Status  Achieved      PT LONG TERM GOAL  #10   TITLE  Pt will demo proper alignment of feet/ knee/ pelvis and no LOB with lunge position while performing resistance band exercises in order to progress with global mm strengthening without overactivity of pelvic floor mm/ pelvic malalignment 2/2 scoliosis/ leg length difference.    Time  4    Period  Weeks    Status  New            Plan - 05/22/19 2146    Clinical Impression Statement Pt showed the following improvements today:  _bilateral perineal sensation, no deficits between L/ R and able to differentiate between dull and sharp  along all dermatomes in pelvic area  _no tensions/ tenderness near perineal scar _minimal cues for squat ( good carry over from last session)  _decreased deep layer pelvic floor mm ( B obt in L>R) post Tx today _increased L SIJ mobility and no tenderness at level of S2 post Tx _increased propioception of anterior / posterior tilt  Provided self-correction technique for realigning PSIS against wall when she feels L hip at work. Plan  to address slight R posterior trunk rotation, R toe in at next session which may be related to L PSIS posterior rotation if L hip pain still persists at work.  Still withholding resistance band strengthening until pt demonstrates improved balance with lunge positions.   Pt continues to benefit from skilled PT.     Personal Factors and Comorbidities  Past/Current Experience;Time since onset of injury/illness/exacerbation    Examination-Activity Limitations  Continence;Toileting;Other    Examination-Participation Restrictions  Community Activity    Stability/Clinical Decision Making  Evolving/Moderate complexity    Rehab Potential  Good    PT Frequency  2x / week    PT Duration  Other (comment)   10   PT Treatment/Interventions  Cryotherapy;Biofeedback;Moist Heat;Ultrasound;Therapeutic activities;Therapeutic exercise;Neuromuscular re-education;Manual techniques;Patient/family education;Scar mobilization;Dry needling    Consulted and Agree with Plan of Care  Patient       Patient will benefit from skilled therapeutic intervention in order to improve the following deficits and impairments:  Pain, Increased fascial restricitons, Decreased coordination, Decreased mobility, Increased muscle spasms, Decreased activity tolerance, Decreased endurance, Decreased strength, Decreased knowledge of use of DME, Postural dysfunction, Improper body mechanics, Difficulty walking  Visit Diagnosis: Muscle weakness (generalized)  Other muscle spasm  Leg length discrepancy  Diastasis recti     Problem List Patient Active Problem List   Diagnosis Date Noted  . Postpartum depression 03/01/2019  . Complex partial seizure ([place]) 03/21/2018  . Encephalomalacia 03/21/2018  . Anxiety disorder 03/21/2018  . Epilepsy ([place]) 01/30/2017  . Abnormal MRI of head 10/27/2015  . History of bacterial meningitis in infancy 10/27/2015    Jerl Mina ,PT, DPT, E-RYT  05/22/2019, 9:50 PM  [place] MAIN [facility] SERVICES [address] [place], [place], [address] Phone: [phone]   Fax:  [phone]  Name: Harpreet Signore MRN: [id number] Date of Birth: [birth year]

## 2019-05-22 NOTE — Patient Instructions (Signed)
repositioning for pelvis  Anterior/ posterior tilt  Shoulder / head against the wall   10 reps  X 2 day at work    ___  When doing deep core level 1 and 2   Notice pressing gently at the PSIS  ( back of the hips) and 4 corners of feet  ____  Normal squat :  trunk is at an angle like you are gazing at your reflection in the pond

## 2019-05-24 ENCOUNTER — Other Ambulatory Visit: Payer: Self-pay

## 2019-05-24 ENCOUNTER — Ambulatory Visit: Payer: No Typology Code available for payment source | Admitting: Physical Therapy

## 2019-05-24 DIAGNOSIS — M6281 Muscle weakness (generalized): Secondary | ICD-10-CM

## 2019-05-24 DIAGNOSIS — M2170 Unequal limb length (acquired), unspecified site: Secondary | ICD-10-CM

## 2019-05-24 DIAGNOSIS — M62838 Other muscle spasm: Secondary | ICD-10-CM

## 2019-05-24 DIAGNOSIS — M6208 Separation of muscle (nontraumatic), other site: Secondary | ICD-10-CM

## 2019-05-24 NOTE — Patient Instructions (Signed)
Positive Feedback notebook or calendar  _ document the changes, feedback from your hsuband on the lunges  _ frequency at which you notice the pulling sensations coming on     For example:     (today during the walking test, 650 ft on the first trial.  After 6 min, 1130 ft with picking up the knees higher and swinging from the heart/ shoulders, you experienced no pulling )   Notice when your alarm goes off ,  Positive self talk" I am learning. My body is learning, I am patient. I will notice but not judge as good or bad. I am trying this new way" . Be kind, patient, and loving to yourself     Practice this week  -Relax arms and not brace in front when standing/ walking _use a fanny pack instead of purse to allow for _let arms swing from between shoulder blades/ heart  _Walk 6 min a day:  picking up the knees higher and swinging from the heart/ shoulders

## 2019-05-25 NOTE — Therapy (Addendum)
[place] MAIN [facility] SERVICES [address] [place], [place], [address] Phone: [phone]   Fax:  [phone]  Physical Therapy Treatment  Patient Details  Name: Chelsea [place] MRN: [id number] Date of Birth: 11-07-1985 Referring Provider (PT): Billey Chang    Encounter Date: 05/24/2019    Past Medical History:  Diagnosis Date  . Anxiety disorder   . Encephalomalacia 03/21/2018   Occipital bilateral  . GAD (generalized anxiety disorder) 03/21/2018  . Heat intolerance   . Hypoglycemia   . Meningitis   . Postpartum depression 03/01/2019  . Seizures ([place])    as an infant    Past Surgical History:  Procedure Laterality Date  . EYE SURGERY    . TYMPANOSTOMY TUBE PLACEMENT      There were no vitals filed for this visit.    Subjective:  Pt feels agitated when she feels she is not getting better or something is getting worst.  Pt feels her L hip hurt almost all the time  before starting PT in March. Since starting PT , she has experienced 3 weeks without L hip pain and now this week it has come back in and off every 3 days at a level of annoyance 4/10 and not like when it was when her pelvic floor went into spasms. Pelvic spasms now are going in spasms to the degree that she felt she needed to go to ER. Now she just notices, it feels tightness 2 days after our treatments and not to a severe point until 5 days out.  Pt noticed that when the pain returns she feels like she is not getting better. Pt reports her husband noticed her  balance is getting compared to last week with the lunge because, in the past, it took her ten trials before getting into the position, whereas this week it took only 2 trials to get into a lunge without losing balance. Pt states that getting better means to her is not having pain and not needing PT. If there is pulling, she understand it is not pain and that it is always not getting worse.     Objective:  Posture and gait:  arms crossed across chest, minimize arm wing, trunk rotation, L genu valgus greater than R in gait.   6 min walk test:  Without cues on gait mechanics: 650 ft 2 min - pt reported "tugging" at L PSIS  With cues on gait mechanics to increase arm swings, scapular depression, trunk rotation: 1130 ft 44mn - No tugging reported   Assessment: Improved gait mechanics , less guarding    Plan: Pt benefited from neuromuscular re-education on gait mechanics to promote  arm swings, scapular depression, trunk rotation which helped with decreasing "tugging" complaints at L hip. Pt was provided motivational interviewing, active listening, empathy, positive encouragement,  and pain science re-education.   Plan to continue with lower kinetic chain strengthening/ training to minimize L genu valgus.                        PT Long Term Goals - 06/07/19 1138      PT LONG TERM GOAL #1   Title  independent with HEP and understand how to progress herself    Time  10    Period  Weeks    Status  Achieved      PT LONG TERM GOAL #2   Title  Pt will decrease her [place] from 52 % to <  25 % in order to improve QOL    Time  10    Period  Weeks    Status  On-going      PT LONG TERM GOAL #3   Title  Pt will demo proper pelvic floor lengthening, deep core coordination in order to decrease urinary frequency, pelvic pain, and improve bowel movements    Time  6    Period  Weeks    Status  Achieved      PT LONG TERM GOAL #4   Title  Pt will demo equal alignment of pelvis and equal alignment iliac crest ( no anterior Lrotation) with compliance wearing shoe lift in R shoe   in order to be 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    Time  2    Period  Weeks    Status  Achieved      PT LONG TERM GOAL #5   Title  Pt will report no fecal / urine leakage across 1 month in order to participate in community activities and work duties    Time  8    Period  Weeks    Status  On-going       PT LONG TERM GOAL #6   Title  Pt will demo proper body mechanics ( anterior tilt of pelvis in seating, baby lifting/ carrying, walking with trunk rotation, transfer floor<>stand) in order to improve mobility of lumbopelvis, increase pelvic stability to perform functional activities without straining pelvic area    Time  6    Period  Weeks    Status  Partially Met      PT LONG TERM GOAL #7   Title  Pt will demo decreased abdominal separation from 3 fingers width along linea alba and 1 knuckle depth to < 1 finger width and decreased depth in order to increase intraabdominal pressure for multiple pelvic functions.    Time  4    Period  Weeks    Status  Achieved      PT LONG TERM GOAL #8   Title  Pt will decrease her NIH-CPSI score from 65% to < 30% in order to improve pelvic floor function and QOL    Time  7    Period  Weeks    Status  On-going      PT LONG TERM GOAL  #9   TITLE  Pt will regain sensation with dull/ sharp at medial / inferior cluneal S1,2,3 on L pelvic floor in order to regain sensation for optimal pelvic floor function    Time  4    Period  Weeks    Status  Achieved      PT LONG TERM GOAL  #10   TITLE  Pt will demo proper alignment of feet/ knee/ pelvis and no LOB with lunge position while performing resistance band exercises in order to progress with global mm strengthening without overactivity of pelvic floor mm/ pelvic malalignment 2/2 scoliosis/ leg length difference.    Time  4    Period  Weeks    Status  On-going              Patient will benefit from skilled therapeutic intervention in order to improve the following deficits and impairments:  Pain, Increased fascial restricitons, Decreased coordination, Decreased mobility, Increased muscle spasms, Decreased activity tolerance, Decreased endurance, Decreased strength, Decreased knowledge of use of DME, Postural dysfunction, Improper body mechanics, Difficulty walking  Visit Diagnosis: Muscle weakness  (generalized)  Other muscle spasm  Leg length discrepancy  Diastasis recti     Problem List Patient Active Problem List   Diagnosis Date Noted  . Postpartum depression 03/01/2019  . Complex partial seizure ([place]) 03/21/2018  . Encephalomalacia 03/21/2018  . Anxiety disorder 03/21/2018  . Epilepsy ([place]) 01/30/2017  . Abnormal MRI of head 10/27/2015  . History of bacterial meningitis in infancy 10/27/2015    Jerl Mina ,PT, DPT, E-RYT  06/11/2019, 9:41 AM  [place] MAIN [facility] SERVICES [address] [place], [place], [address] Phone: [phone]   Fax:  [phone]  Name: Chelsea [place] MRN: [id number] Date of Birth: 05-Nov-1985

## 2019-05-31 ENCOUNTER — Ambulatory Visit: Payer: No Typology Code available for payment source | Admitting: Physical Therapy

## 2019-05-31 ENCOUNTER — Other Ambulatory Visit: Payer: Self-pay

## 2019-05-31 DIAGNOSIS — M2170 Unequal limb length (acquired), unspecified site: Secondary | ICD-10-CM

## 2019-05-31 DIAGNOSIS — M6208 Separation of muscle (nontraumatic), other site: Secondary | ICD-10-CM

## 2019-05-31 DIAGNOSIS — M6281 Muscle weakness (generalized): Secondary | ICD-10-CM | POA: Diagnosis not present

## 2019-05-31 DIAGNOSIS — M62838 Other muscle spasm: Secondary | ICD-10-CM

## 2019-05-31 NOTE — Patient Instructions (Addendum)
Improvements we dicussed to remember and celebrate    1) The leakage is occurring at smaller amounts compared to the past.   2) Sensation has improved in way you being to tell the difference between dull and sharp around the vulva and perineal scar with the Q-tip, and being able to reagin the sensation on the L side equally as the R side   3) L tightness in hip: The area of tightness moved from the L back of the glut to the side of the gluts. "There is less pain, improved balance ( lunges). "    Please do not over do the exercises.

## 2019-06-01 NOTE — Therapy (Addendum)
[place] MAIN [facility] SERVICES [address] [place], [place], [address] Phone: [phone]   Fax:  [phone]  Physical Therapy Treatment  Patient Details  Name: Chelsea Collins MRN: [id number] Date of Birth: Apr 04, 1986 Referring Provider (PT): Billey Chang    Encounter Date: 05/31/2019  PT End of Session - 05/31/19 0915    Visit Number  18    Date for PT Re-Evaluation  07/05/19    PT Start Time  0805    PT Stop Time  0911    PT Time Calculation (min)  66 min    Activity Tolerance  Patient tolerated treatment well;No increased pain    Behavior During Therapy  WFL for tasks assessed/performed       Past Medical History:  Diagnosis Date  . Anxiety disorder   . Encephalomalacia 03/21/2018   Occipital bilateral  . GAD (generalized anxiety disorder) 03/21/2018  . Heat intolerance   . Hypoglycemia   . Meningitis   . Postpartum depression 03/01/2019  . Seizures ([place])    as an infant    Past Surgical History:  Procedure Laterality Date  . EYE SURGERY    . TYMPANOSTOMY TUBE PLACEMENT      There were no vitals filed for this visit.  Subjective Assessment - 05/31/19 0809    Subjective  Pt is not seeing progress with her symptoms. Pt feels frustrated because exercises are given to her and then taken away because she can not do. There are three problems are still there: 1) tightness in the hip got worse over the weekend but then it got better and transfered from back of the hip to the side of hip. ( Pt states she is calling it tightness instead of pain because it can be percieved as psychology. But with thightness, it can be measurable). Pt feels freaked out about how much weaker the L leg is.  Pt pointed to the area in the back by top of ilic crest and the lateral hip. Both areas occurred at different times. The one area that bothered her the most was the side of the hip and it felt deep. She was able to stretch it looser and then it was still  there in nagging. Pt tried standing on her L leg doingheel raises without holding the wall, ( L 3 reps, R 4 reps). After doing these, pt felt a release in the back area by the sacrum. Pt states she feels stubborn and if she doesn't do one correctly, she feels it does not count. She turns out doing more reps because she begin over and end up doing more reps.    2) sensation in the pelvic area. Pt felt no able feel sensatino in the vagina both inside and outside. Pt is not sure how it can be measurable to . Pt stated she was able to have sex without pain after working with the first pelvic PT who worked scar releases at the 3rd degree tear. Pt still does not have pain with sex, she does not have sensation. When she worked with the first PT, she did not have sensation and was ont able to perform pelvic floor squeeze and the therapist had to use her hands to move the muscles when training her. Since starting PT here, pt has been able to squeeze her pelvic floor muscles. Pt knows that her brain is telling the muscles what to do,but she is not trusting that the brain is doing what she told it to  do so she is using her hand on the pelvic floor muscles or asking her husband to watch that she is in fact moving the pelvic floor.   3) urinary incontinence occurs everyday and sometimes it is more noticable than other times. There is not a trigger to when it happens. One time, she had to go to the bathroom and she sneezed or coughed . She leaked like a spoonful compared to drops at other times. The leakage is occurring at smaller amounts compared to the past.      Pertinent History  Sometimes she feels she second guesses she did not do the exercise right but she knows she did. Pt feels frustrated that she failed. This is different from being in school where she can practice sonething and get it right but there is limit with the doing the exercises because there is apoint she notices when she overworking the muscles by  doing the exercises too perfect.    Patient Stated Goals  Working and walking with stroller and baby without hip, pelvic, thigh,  glut tightness and have intercourse without pain         O:  Sensory test with q_tip at perineal area:  dull and sharp around the vulva and perineal scar bilaterally intact  pressure sensation around the vulva and perineal scar L side equally as the R side   A: Pt has regained sensation for dull/ sharp, and pressure around vulva compared to past sessions when pt had decreased sensation on L pelvic floor region compared to R. Pt benefited from more education with visual anatomy images and explanations on nerve innervation to pelvic floor and also explanation on sensory training, sensation tests. Explained the plan for how current treatments and plan for progression of treatments will help with all 3 of her complaints. Explained the benefits of interdisciplinary communication between DPT and counselor.   Pt declined DPT communicating with her counselor.    P:  Pt is progressing well with recovery of sensation at pelvic floor. Further progress with lower kinetic chain strengthening , pelvic floor self-palpation for pressure sensory training. Pt continues to benefit from skilled PT.                             PT Long Term Goals - 05/22/19 2146      PT LONG TERM GOAL #1   Title  independent with HEP and understand how to progress herself    Time  10    Period  Weeks    Status  Achieved      PT LONG TERM GOAL #2   Title  Pt will decrease her [place]  from 52 % to <25 % in order to improve QOL    Time  10    Period  Weeks    Status  On-going      PT LONG TERM GOAL #3   Title  Pt will demo proper pelvic floor lengthening, deep core coordination in order to decrease urinary frequency, pelvic pain, and improve bowel movements    Time  6    Period  Weeks    Status  Achieved      PT LONG TERM GOAL #4   Title  Pt will demo equal alignment of  pelvis and equal alignment iliac crest ( no anterior Lrotation) with compliance wearing shoe lift in R shoe   in order to be able to 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 pushing  stroller and baby    Time  2    Period  Weeks    Status  Achieved      PT LONG TERM GOAL #5   Title  Pt will report no fecal / urine leakage across 1 month in order to participate in community activities and work duties    Time  8    Period  Weeks    Status  On-going      Additional Long Term Goals   Additional Long Term Goals  Yes      PT LONG TERM GOAL #6   Title  Pt will demo proper body mechanics ( anterior tilt of pelvis in seating, baby lifting/ carrying, walking with trunk rotation, transfer floor<>stand) in order to improve mobility of lumbopelvis, increase pelvic stability to perform functional activities without straining pelvic area    Time  6    Period  Weeks    Status  Partially Met      PT LONG TERM GOAL #7   Title  Pt will demo decreased abdominal separation from 3 fingers width along linea alba and 1 knuckle depth to < 1 finger width and decreased depth in order to increase intraabdominal pressure for multiple pelvic functions.    Time  4    Period  Weeks    Status  Achieved      PT LONG TERM GOAL #8   Title  Pt will decrease her NIH-CPSI score from 65% to < 30% in order to improve pelvic floor function and QOL    Time  7    Period  Weeks    Status  On-going      PT LONG TERM GOAL  #9   TITLE  Pt will regain sensation with dull/ sharp at medial / inferior cluneal S1,2,3 on L pelvic floor in order to regain sensation for optimal pelvic floor function    Time  4    Period  Weeks    Status  Achieved      PT LONG TERM GOAL  #10   TITLE  Pt will demo proper alignment of feet/ knee/ pelvis and no LOB with lunge position while performing resistance band exercises in order to progress with global mm strengthening without overactivity of pelvic floor mm/ pelvic malalignment 2/2 scoliosis/ leg length difference.     Time  4    Period  Weeks    Status  New            Plan - 05/31/19 0915    Clinical Impression Statement  Pt stated she will say to herself just try your best    Personal Factors and Comorbidities  Past/Current Experience;Time since onset of injury/illness/exacerbation    Examination-Activity Limitations  Continence;Toileting;Other    Examination-Participation Restrictions  Community Activity    Stability/Clinical Decision Making  Evolving/Moderate complexity    Rehab Potential  Good    PT Frequency  2x / week    PT Duration  Other (comment)   10   PT Treatment/Interventions  Cryotherapy;Biofeedback;Moist Heat;Ultrasound;Therapeutic activities;Therapeutic exercise;Neuromuscular re-education;Manual techniques;Patient/family education;Scar mobilization;Dry needling    Consulted and Agree with Plan of Care  Patient       Patient will benefit from skilled therapeutic intervention in order to improve the following deficits and impairments:  Pain, Increased fascial restricitons, Decreased coordination, Decreased mobility, Increased muscle spasms, Decreased activity tolerance, Decreased endurance, Decreased strength, Decreased knowledge of use of DME, Postural dysfunction, Improper body mechanics, Difficulty walking  Visit Diagnosis: Muscle  weakness (generalized)  Other muscle spasm  Leg length discrepancy  Diastasis recti     Problem List Patient Active Problem List   Diagnosis Date Noted  . Postpartum depression 03/01/2019  . Complex partial seizure ([place]) 03/21/2018  . Encephalomalacia 03/21/2018  . Anxiety disorder 03/21/2018  . Epilepsy ([place]) 01/30/2017  . Abnormal MRI of head 10/27/2015  . History of bacterial meningitis in infancy 10/27/2015    Jerl Mina 06/01/2019, 9:57 AM  [place] MAIN [facility] SERVICES [place], [place], [address] Phone: [phone]   Fax:  [phone]  Name: Chelsea Collins MRN:  [id number] Date of Birth: 1986/02/22

## 2019-06-05 ENCOUNTER — Other Ambulatory Visit: Payer: Self-pay

## 2019-06-05 ENCOUNTER — Ambulatory Visit: Payer: No Typology Code available for payment source | Admitting: Physical Therapy

## 2019-06-05 DIAGNOSIS — M6281 Muscle weakness (generalized): Secondary | ICD-10-CM

## 2019-06-05 DIAGNOSIS — M2170 Unequal limb length (acquired), unspecified site: Secondary | ICD-10-CM

## 2019-06-05 DIAGNOSIS — M6208 Separation of muscle (nontraumatic), other site: Secondary | ICD-10-CM

## 2019-06-05 DIAGNOSIS — M62838 Other muscle spasm: Secondary | ICD-10-CM

## 2019-06-05 NOTE — Patient Instructions (Addendum)
Pushing stroller with legs strength, shoulders back, hands shoulder width apart, elbows by ribs   __________ Glut strengthening  Discontinue the lunges this week  Replace with one leg dips  Stand at the doorway  (perpendicular)  Hips in line with doorway, R hand at the level of pockets at the doorway  Four corners of the R foot pressing down Dip trunk down like Journalist, newspaper,  L foot ' legs lifts up , ankle 90 degs, toes spread, point to the ground like you are pushing against the wall   10 reps x 3 x 1 day    _____   Self-pressure training exercise to improve sensation in the vaginal area  Seated, after showering   5 breaths with insert of index finger first knuckle Clockwork pattern with index finger   5 breaths 1.5 knuckle depth  Clockwork pattern with index finger   5 breaths with insert of index finger 2nd knuckle depth Clockwork pattern with index finger   Observe pressure   ___  Moisturizers .             They are used in the vagina to hydrate the mucous membrane that make up the vaginal canal. .             Designed to keep a more normal acid balance (ph) .             Once placed in the vagina, it will last between two to three days. .             Use 2-3 times per week at bedtime and last longer than 60 min. .             Ingredients to avoid is glycerin and fragrance, can increase chance of infection .             Should not be used just before sex due to causing irritation .             Most are gels administered either in a tampon-shaped applicator or as a vaginal suppository.                They  are non-hormonal.     Types of Moisturizers .             Samul Dada- drug store .             Vitamin E vaginal suppositories- Whole foods, Amazon .             Moist Again .             Coconut oil- can break down condoms .             Julva- (Do no use if on Tamoxifen) amazon .             Yes moisturizer- amazon .             NeuEve Silk , NeuEve  Silver for menopausal or over 65 (if have severe vaginal atrophy or cancer                treatments use NeuEve Silk for  1 month than move to [employer]epsi)- [employer], [email or web address] .             Olive and Bee intimate cream- [email or web address] .             Mae vaginal moisturizer- Amazon      Lubrication .  Used for intercourse to reduce friction .             Avoid ones that have glycerin, warming gels, tingling gels, icing or cooling gel, scented .             Avoid parabens due to a preservative similar to female sex hormone .             May need to be reapplied once or several times during sexual activity .             Can be applied to both partners genitals prior to vaginal penetration to minimize friction or irritation .             Prevent irritation and mucosal tears that cause post coital pain and increased the risk of vaginal and                urinary tract infections .             Oil-based lubricants cannot be used with condoms due to breaking them down.  Least likely to irritate                vaginal tissue. .             Plant based-lubes are safe .             Silicone-based lubrication are thicker and last long and used for post-menopausal women   Vaginal Lubricators Here is a list of some suggested lubricators you can use for intercourse. Use the most hypoallergenic product.  You can place on you or your partner. Delfin Gant Stuff     Sylk or Sliquid Natural H2O ( good  if frequent UTI's)                Blossom Organics ([email or web address]-[email or web address])                Luvena                Coconut oil                PJur Woman Nude- water based lubricant, amazon                Uberlube- Amazon                Aloe Vera                Yes lubricant- [employer] Platinum-Silicone, Target, [employer]reens                Olive and Bee intimate cream-  [email or web address]  Things to avoid in lubricants are glycerin, warming gels, tingling gels, icing  or cooling gels, and scented gels.  Also avoid Vaseline. KY jelly, Replens, and Astroglide kills good bacteria(lactobacilli)   Things to avoid in the vaginal area .             Do not use things to irritate the vulvar area .             No lotions- see below .             No soaps; can use Aveeno or Calendula cleanser if needed. Must be gentle .             No deodorants .  No douches .             Good to sleep without underwear to let the vaginal area to air out .             No scrubbing: spread the lips to let warm water rinse over labias and pat dry

## 2019-06-05 NOTE — Therapy (Signed)
[place] MAIN [facility] SERVICES [address] [place], [place], [address] Phone: [phone]   Fax:  [phone]  Physical Therapy Treatment  Patient Details  Name: Chelsea Collins MRN: [id number] Date of Birth: [birth year] Referring Provider (PT): Billey Chang    Encounter Date: 06/05/2019  PT End of Session - 06/05/19 2230    Visit Number  19    Date for PT Re-Evaluation  07/05/19    PT Start Time  0801    PT Stop Time  0905    PT Time Calculation (min)  64 min    Activity Tolerance  Patient tolerated treatment well;No increased pain    Behavior During Therapy  WFL for tasks assessed/performed       Past Medical History:  Diagnosis Date  . Anxiety disorder   . Encephalomalacia 03/21/2018   Occipital bilateral  . GAD (generalized anxiety disorder) 03/21/2018  . Heat intolerance   . Hypoglycemia   . Meningitis   . Postpartum depression 03/01/2019  . Seizures ([place])    as an infant    Past Surgical History:  Procedure Laterality Date  . EYE SURGERY    . TYMPANOSTOMY TUBE PLACEMENT      There were no vitals filed for this visit.  Subjective Assessment - 06/05/19 0805    Subjective  Pt reported she is feeling fine. Pt reported she did not write down her improvements on the calendar because there was not a problem over the past week and so her brain was not thinking the 3 issues she has had across the 4 four days. Pt did not feel the tightness in her L hip for the past week.    Pertinent History  Sometimes she feels she second guesses she did not do the exercise right but she knows she did. Pt feels frustrated that she failed. 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    Patient Stated Goals  Working and walking with stroller and baby without hip,  pelvic, thigh,  glut tightness and have intercourse without pain         [facility] PT Assessment - 06/05/19 0811      Other:   Other/ Comments  simulated pushing stroller against resistance created by DPT : overuse of BUE and less lower kinetich chain       Other:   Other/Comments  SLS deadlift without weights, single UE on wall:  no cues for knee propioception, cued for more pressure on 1st metatarsal for more stability. Modified position for hinge       Ambulation/Gait   Gait Comments  no cues required with walking without arms guarding trunk                    [facility] Adult PT Treatment/Exercise - 06/05/19 2202      Neuro Re-ed    Neuro Re-ed Details   tactile cues for more lower kinetic chain co-activation with pushing ( semi-tandem stance) , cued for propioception in SLS deadlift/ technique .  Explained about sensory retraining for pressure to practice at home after showering for self-palpation using first digit in introitus different depth to promote sensation                   PT Long Term Goals -  05/22/19 2146      PT LONG TERM GOAL #1   Title  independent with HEP and understand how to progress herself    Time  10    Period  Weeks    Status  Achieved      PT LONG TERM GOAL #2   Title  Pt will decrease her [place]  from 52 % to <25 % in order to improve QOL    Time  10    Period  Weeks    Status  On-going      PT LONG TERM GOAL #3   Title  Pt will demo proper pelvic floor lengthening, deep core coordination in order to decrease urinary frequency, pelvic pain, and improve bowel movements    Time  6    Period  Weeks    Status  Achieved      PT LONG TERM GOAL #4   Title  Pt will demo equal alignment of pelvis and equal alignment iliac crest ( no anterior Lrotation) with compliance wearing shoe lift in R shoe   in order to be 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    Time  2    Period  Weeks    Status  Achieved      PT LONG TERM GOAL  #5   Title  Pt will report no fecal / urine leakage across 1 month in order to participate in community activities and work duties    Time  8    Period  Weeks    Status  On-going      Additional Long Term Goals   Additional Long Term Goals  Yes      PT LONG TERM GOAL #6   Title  Pt will demo proper body mechanics ( anterior tilt of pelvis in seating, baby lifting/ carrying, walking with trunk rotation, transfer floor<>stand) in order to improve mobility of lumbopelvis, increase pelvic stability to perform functional activities without straining pelvic area    Time  6    Period  Weeks    Status  Partially Met      PT LONG TERM GOAL #7   Title  Pt will demo decreased abdominal separation from 3 fingers width along linea alba and 1 knuckle depth to < 1 finger width and decreased depth in order to increase intraabdominal pressure for multiple pelvic functions.    Time  4    Period  Weeks    Status  Achieved      PT LONG TERM GOAL #8   Title  Pt will decrease her NIH-CPSI score from 65% to < 30% in order to improve pelvic floor function and QOL    Time  7    Period  Weeks    Status  On-going      PT LONG TERM GOAL  #9   TITLE  Pt will regain sensation with dull/ sharp at medial / inferior cluneal S1,2,3 on L pelvic floor in order to regain sensation for optimal pelvic floor function    Time  4    Period  Weeks    Status  Achieved      PT LONG TERM GOAL  #10   TITLE  Pt will demo proper alignment of feet/ knee/ pelvis and no LOB with lunge position while performing resistance band exercises in order to progress with global mm strengthening without overactivity of pelvic floor mm/ pelvic malalignment 2/2 scoliosis/ leg length difference.  Time  4    Period  Weeks    Status  New            Plan - 06/05/19 2232    Clinical Impression Statement Pt is making progress with biopsychosocial approaches and pain science education. Pt reported she did not feel the tightness in her L  hip for the past week. Today pt demo'd improved lower kinetic chain alignment and was ready to progress to SLS deadlift, replacing lunge. Genu valgus is improving, but required more cues for trasnverse arch co-activation. Still withholding resistance band.  Pt demo'd improved use of lower kinetic chain/ scapular stabilization in simulated activity with pushing stroller. Added education on self-palpation at home to improve sensation at different depth inside introitus. Pt continues to benefit from skilled PT.        Personal Factors and Comorbidities  Past/Current Experience;Time since onset of injury/illness/exacerbation    Examination-Activity Limitations  Continence;Toileting;Other    Examination-Participation Restrictions  Community Activity    Stability/Clinical Decision Making  Evolving/Moderate complexity    Rehab Potential  Good    PT Frequency  2x / week    PT Duration  Other (comment)   10   PT Treatment/Interventions  Cryotherapy;Biofeedback;Moist Heat;Ultrasound;Therapeutic activities;Therapeutic exercise;Neuromuscular re-education;Manual techniques;Patient/family education;Scar mobilization;Dry needling    Consulted and Agree with Plan of Care  Patient       Patient will benefit from skilled therapeutic intervention in order to improve the following deficits and impairments:  Pain, Increased fascial restricitons, Decreased coordination, Decreased mobility, Increased muscle spasms, Decreased activity tolerance, Decreased endurance, Decreased strength, Decreased knowledge of use of DME, Postural dysfunction, Improper body mechanics, Difficulty walking  Visit Diagnosis: Muscle weakness (generalized)  Other muscle spasm  Leg length discrepancy  Diastasis recti     Problem List Patient Active Problem List   Diagnosis Date Noted  . Postpartum depression 03/01/2019  . Complex partial seizure ([place]) 03/21/2018  . Encephalomalacia 03/21/2018  . Anxiety disorder 03/21/2018  .  Epilepsy ([place]) 01/30/2017  . Abnormal MRI of head 10/27/2015  . History of bacterial meningitis in infancy 10/27/2015    Jerl Mina ,PT, DPT, E-RYT  06/05/2019, 10:33 PM  [place] MAIN [facility] SERVICES [address] [place], [place], [address] Phone: [phone]   Fax:  [phone]  Name: Chelsea Collins MRN: [id number] Date of Birth: [birth year]

## 2019-06-07 ENCOUNTER — Ambulatory Visit: Payer: No Typology Code available for payment source | Admitting: Physical Therapy

## 2019-06-07 ENCOUNTER — Other Ambulatory Visit: Payer: Self-pay

## 2019-06-07 DIAGNOSIS — M6281 Muscle weakness (generalized): Secondary | ICD-10-CM

## 2019-06-07 DIAGNOSIS — M6208 Separation of muscle (nontraumatic), other site: Secondary | ICD-10-CM

## 2019-06-07 DIAGNOSIS — M62838 Other muscle spasm: Secondary | ICD-10-CM

## 2019-06-07 DIAGNOSIS — M2170 Unequal limb length (acquired), unspecified site: Secondary | ICD-10-CM

## 2019-06-07 NOTE — Therapy (Signed)
[place] MAIN [facility] SERVICES [address] [place], [place], [address] Phone: [phone]   Fax:  [phone]  Physical Therapy  / Progress Note   Patient Details  Name: Chelsea Collins MRN: [id number] Date of Birth: 04-30-1986 Referring Provider (PT): Billey Chang    Encounter Date: 06/07/2019  PT End of Session - 06/07/19 0809    Visit Number  20    Date for PT Re-Evaluation  07/05/19    PT Start Time  0805    PT Stop Time  0905    PT Time Calculation (min)  60 min    Activity Tolerance  Patient tolerated treatment well;No increased pain    Behavior During Therapy  WFL for tasks assessed/performed       Past Medical History:  Diagnosis Date  . Anxiety disorder   . Encephalomalacia 03/21/2018   Occipital bilateral  . GAD (generalized anxiety disorder) 03/21/2018  . Heat intolerance   . Hypoglycemia   . Meningitis   . Postpartum depression 03/01/2019  . Seizures ([place])    as an infant    Past Surgical History:  Procedure Laterality Date  . EYE SURGERY    . TYMPANOSTOMY TUBE PLACEMENT      There were no vitals filed for this visit.  Subjective Assessment - 06/07/19 0808    Subjective   Pt did not feel the tightness in her L hip as well for the past week and half.     Pertinent History    Patient Stated Goals  Working and walking with stroller and baby without hip, pelvic, thigh,  glut tightness and have intercourse without pain         [facility] PT Assessment - 06/07/19 0928      Ambulation/Gait   Gait Comments  no cues required with arm swing and not guarding chest                 Pelvic Floor Special Questions - 06/07/19 0928    External Palpation  tenderness/ tightness B obt int, ATLA fascial release, 4-6 o'clock L / 6-8 o'clock R 3rd layer         [facility] Adult PT Treatment/Exercise - 06/07/19 6244      Therapeutic Activites    Other Therapeutic Activities  self-palpation/ sensory retraining       Neuro  Re-ed    Neuro Re-ed Details   cued for sequential movement of all layers of pelvic floor , less ab overuse .       Moist Heat Therapy   Number Minutes Moist Heat  5 Minutes    Moist Heat Location  Other (comment)   through sheets/ pillow case: perineum      Manual Therapy   Internal Pelvic Floor  fascial glides/ MWM at B obt int, ATLA fascial release, 4-6 o'clock L / 6-8 o'clock R 3rd layer                   PT Long Term Goals - 06/07/19 1138      PT LONG TERM GOAL #1   Title  independent with HEP and understand how to progress herself    Time  10    Period  Weeks    Status  Achieved      PT LONG TERM GOAL #2   Title  Pt will decrease her [place]  from 52 % to <25 % in order to improve QOL    Time  10  Period  Weeks    Status  On-going      PT LONG TERM GOAL #3   Title  Pt will demo proper pelvic floor lengthening, deep core coordination in order to decrease urinary frequency, pelvic pain, and improve bowel movements    Time  6    Period  Weeks    Status  Achieved      PT LONG TERM GOAL #4   Title  Pt will demo equal alignment of pelvis and equal alignment iliac crest ( no anterior Lrotation) with compliance wearing shoe lift in R shoe   in order to be 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    Time  2    Period  Weeks    Status  Achieved      PT LONG TERM GOAL #5   Title  Pt will report no fecal / urine leakage across 1 month in order to participate in community activities and work duties    Time  8    Period  Weeks    Status  On-going      PT LONG TERM GOAL #6   Title  Pt will demo proper body mechanics ( anterior tilt of pelvis in seating, baby lifting/ carrying, walking with trunk rotation, transfer floor<>stand) in order to improve mobility of lumbopelvis, increase pelvic stability to perform functional activities without straining pelvic area    Time  6    Period  Weeks    Status  Partially Met      PT LONG TERM GOAL #7   Title  Pt  will demo decreased abdominal separation from 3 fingers width along linea alba and 1 knuckle depth to < 1 finger width and decreased depth in order to increase intraabdominal pressure for multiple pelvic functions.    Time  4    Period  Weeks    Status  Achieved      PT LONG TERM GOAL #8   Title  Pt will decrease her NIH-CPSI score from 65% to < 30% in order to improve pelvic floor function and QOL    Time  7    Period  Weeks    Status  On-going      PT LONG TERM GOAL  #9   TITLE  Pt will regain sensation with dull/ sharp at medial / inferior cluneal S1,2,3 on L pelvic floor in order to regain sensation for optimal pelvic floor function    Time  4    Period  Weeks    Status  Achieved      PT LONG TERM GOAL  #10   TITLE  Pt will demo proper alignment of feet/ knee/ pelvis and no LOB with lunge position while performing resistance band exercises in order to progress with global mm strengthening without overactivity of pelvic floor mm/ pelvic malalignment 2/2 scoliosis/ leg length difference.    Time  4    Period  Weeks    Status  On-going            Plan - 06/07/19 0809    Clinical Impression Statement Pt has achieved 5/10 goals and is progressing very well towards remaining goals. Pt's L hip tightness is resolving. Currently the goals to regain sensation with sexual intercourse and urinary incontinence are being addressed.   Pelvic sensation distinction between sharp/ dull has been restored but residue perineal scar restriction at deep pelvic floor mm are required to further improve sensation and mobility of  pelvic floor which will render improvement with urinary incontinence as well.   Pt tolerated internal Tx today without complaints. Pelvic floor tightness and tenderness at posterior 3rd layer muscles improved post Tx. Applied neuromuscular re-education to improve further deep core coordination with less abdominal mm overuse and more co-activation of pelvic floor, TrA, diaphragm  in proper sequential manner.  These improvements will continue to help pt achieve her goals.    Past sessions have utilized biospychosocial approaches to help to gain better understanding to pain science and find strategies to work with her anxiety disorder ( currently taking medications and seeking counseling). Biopsychosocial approaches benefited pt greatly. Pt has undergone pelvic therapy with 2 different therapists prior to beginning pelvic PT with current PT and pt experienced relapse of Sx with one therapist. Across the past 20 visits, pt's orthopedic issues: leg length difference, minor scoliosis, mm imbalances, alignment issues at hip/knee/feet, and hip strength have been improving significantly.  Pt has demo'd improved body awareness, proprioception, deep core strength, and balance.   Together these improvements create a strong foundation to yield better outcomes with remaining pelvic issues.    Pt continues to benefit from skilled PT.      Personal Factors and Comorbidities  Past/Current Experience;Time since onset of injury/illness/exacerbation    Examination-Activity Limitations  Continence;Toileting;Other    Examination-Participation Restrictions  Community Activity    Stability/Clinical Decision Making  Evolving/Moderate complexity    Rehab Potential  Good    PT Frequency  2x / week    PT Duration  Other (comment)   10   PT Treatment/Interventions  Cryotherapy;Biofeedback;Moist Heat;Ultrasound;Therapeutic activities;Therapeutic exercise;Neuromuscular re-education;Manual techniques;Patient/family education;Scar mobilization;Dry needling    Consulted and Agree with Plan of Care  Patient       Patient will benefit from skilled therapeutic intervention in order to improve the following deficits and impairments:  Pain, Increased fascial restricitons, Decreased coordination, Decreased mobility, Increased muscle spasms, Decreased activity tolerance, Decreased endurance, Decreased  strength, Decreased knowledge of use of DME, Postural dysfunction, Improper body mechanics, Difficulty walking  Visit Diagnosis: Muscle weakness (generalized)  Other muscle spasm  Leg length discrepancy  Diastasis recti     Problem List Patient Active Problem List   Diagnosis Date Noted  . Postpartum depression 03/01/2019  . Complex partial seizure ([place]) 03/21/2018  . Encephalomalacia 03/21/2018  . Anxiety disorder 03/21/2018  . Epilepsy ([place]) 01/30/2017  . Abnormal MRI of head 10/27/2015  . History of bacterial meningitis in infancy 10/27/2015    Jerl Mina ,PT, DPT, E-RYT  06/07/2019, 11:39 AM  [place] MAIN [facility] SERVICES [address] [place], [place], [address] Phone: [phone]   Fax:  [phone]  Name: Earma Nicolaou MRN: [id number] Date of Birth: 26-Feb-1986

## 2019-06-07 NOTE — Patient Instructions (Signed)
Telescope and elevator imagery  Do Deep core level 1 ( breathing, slower to sense the sequence of the 3 layers and not let the stomach muscles steal the show. On exhale, soft sound, allow for pelvic floor to lift to "2nd floor" naturally, low belly sinks from your hand, then the top belly sinks from your hand

## 2019-06-12 ENCOUNTER — Ambulatory Visit: Payer: No Typology Code available for payment source | Admitting: Physical Therapy

## 2019-06-12 ENCOUNTER — Ambulatory Visit (INDEPENDENT_AMBULATORY_CARE_PROVIDER_SITE_OTHER): Payer: No Typology Code available for payment source | Admitting: Psychology

## 2019-06-12 ENCOUNTER — Other Ambulatory Visit: Payer: Self-pay

## 2019-06-12 DIAGNOSIS — F4321 Adjustment disorder with depressed mood: Secondary | ICD-10-CM

## 2019-06-12 DIAGNOSIS — M6281 Muscle weakness (generalized): Secondary | ICD-10-CM | POA: Diagnosis not present

## 2019-06-12 DIAGNOSIS — M62838 Other muscle spasm: Secondary | ICD-10-CM

## 2019-06-12 DIAGNOSIS — M6208 Separation of muscle (nontraumatic), other site: Secondary | ICD-10-CM

## 2019-06-12 DIAGNOSIS — M2170 Unequal limb length (acquired), unspecified site: Secondary | ICD-10-CM

## 2019-06-12 DIAGNOSIS — F411 Generalized anxiety disorder: Secondary | ICD-10-CM | POA: Diagnosis not present

## 2019-06-12 NOTE — Therapy (Signed)
[place] MAIN [facility] SERVICES [address] [place], [place], [address] Phone: [phone]   Fax:  [phone]  Physical Therapy Treatment  Patient Details  Name: Chelsea Collins MRN: [id number] Date of Birth: [birth year] Referring Provider (PT): Billey Chang    Encounter Date: 06/12/2019  PT End of Session - 06/12/19 1542    Visit Number  21    Date for PT Re-Evaluation  07/05/19    PT Start Time  0803    PT Stop Time  0900    PT Time Calculation (min)  57 min    Activity Tolerance  Patient tolerated treatment well;No increased pain    Behavior During Therapy  WFL for tasks assessed/performed       Past Medical History:  Diagnosis Date  . Anxiety disorder   . Encephalomalacia 03/21/2018   Occipital bilateral  . GAD (generalized anxiety disorder) 03/21/2018  . Heat intolerance   . Hypoglycemia   . Meningitis   . Postpartum depression 03/01/2019  . Seizures ([place])    as an infant    Past Surgical History:  Procedure Laterality Date  . EYE SURGERY    . TYMPANOSTOMY TUBE PLACEMENT      There were no vitals filed for this visit.  Subjective Assessment - 06/12/19 0817    Subjective  Pt felt good after last session. Pt had no issues with the new HEP. Pt noticed it was harder to notice the movement of the pelvic floor during Deep core level 1 when she was on her period. Pt noticed when she had sexual intercourse last week, sensation was better by 30%.    Pertinent History  Sometimes she feels she second guesses she did not do the exercise right but she knows she did. Pt feels frustrated that she failed. 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    Patient Stated Goals  Working and walking with stroller and baby without hip, pelvic, thigh,  glut  tightness and have intercourse without pain         [facility] PT Assessment - 06/12/19 2350      Observation/Other Assessments   Observations  slightly slumped sitting      Ambulation/Gait   Gait Comments  slightly decreased L genu valgus, increased co-activation of transverse arches, less pronation B                  Pelvic Floor Special Questions - 06/12/19 2353    External Perineal Exam  with undergarmet doffed,     Perineal Body/Introitus   Normal    Perineal Body/Introitus other  closure of introitus with cue for quick pelvic floor contraction    External Palpation  sensory testing intact bilaterally, dull/ sharp         [facility] Adult PT Treatment/Exercise - 06/12/19 1545      Therapeutic Activites    Other Therapeutic Activities  reviewed pudendal nerve innervations and role in sensation in perineal area       Neuro Re-ed    Neuro Re-ed Details   cued for SLS glut HEP, sensory retraining                   PT Long Term Goals - 06/07/19 1138      PT LONG TERM  GOAL #1   Title  independent with HEP and understand how to progress herself    Time  10    Period  Weeks    Status  Achieved      PT LONG TERM GOAL #2   Title  Pt will decrease her [place]  from 52 % to <25 % in order to improve QOL    Time  10    Period  Weeks    Status  On-going      PT LONG TERM GOAL #3   Title  Pt will demo proper pelvic floor lengthening, deep core coordination in order to decrease urinary frequency, pelvic pain, and improve bowel movements    Time  6    Period  Weeks    Status  Achieved      PT LONG TERM GOAL #4   Title  Pt will demo equal alignment of pelvis and equal alignment iliac crest ( no anterior Lrotation) with compliance wearing shoe lift in R shoe   in order to be 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    Time  2    Period  Weeks    Status  Achieved      PT LONG TERM GOAL #5   Title  Pt will report no fecal / urine leakage across 1 month  in order to participate in community activities and work duties    Time  8    Period  Weeks    Status  On-going      PT LONG TERM GOAL #6   Title  Pt will demo proper body mechanics ( anterior tilt of pelvis in seating, baby lifting/ carrying, walking with trunk rotation, transfer floor<>stand) in order to improve mobility of lumbopelvis, increase pelvic stability to perform functional activities without straining pelvic area    Time  6    Period  Weeks    Status  Partially Met      PT LONG TERM GOAL #7   Title  Pt will demo decreased abdominal separation from 3 fingers width along linea alba and 1 knuckle depth to < 1 finger width and decreased depth in order to increase intraabdominal pressure for multiple pelvic functions.    Time  4    Period  Weeks    Status  Achieved      PT LONG TERM GOAL #8   Title  Pt will decrease her NIH-CPSI score from 65% to < 30% in order to improve pelvic floor function and QOL    Time  7    Period  Weeks    Status  On-going      PT LONG TERM GOAL  #9   TITLE  Pt will regain sensation with dull/ sharp at medial / inferior cluneal S1,2,3 on L pelvic floor in order to regain sensation for optimal pelvic floor function    Time  4    Period  Weeks    Status  Achieved      PT LONG TERM GOAL  #10   TITLE  Pt will demo proper alignment of feet/ knee/ pelvis and no LOB with lunge position while performing resistance band exercises in order to progress with global mm strengthening without overactivity of pelvic floor mm/ pelvic malalignment 2/2 scoliosis/ leg length difference.    Time  4    Period  Weeks    Status  On-going            Plan -  06/12/19 2348    Clinical Impression Statement  Pt continues to make progress with sensory retraining and increased closure of introitus, and less gait deviations. Pt required cues for more feet propioception in SLS strengthening exercise. Provided anatomy education on pudendal nerve innervations and role in  sensory input. Plan to perform internal manual Tx and progress resistance bands at upcoming session. Pt continues to benefit from skilled PT.    Personal Factors and Comorbidities  Past/Current Experience;Time since onset of injury/illness/exacerbation    Examination-Activity Limitations  Continence;Toileting;Other    Examination-Participation Restrictions  Community Activity    Stability/Clinical Decision Making  Evolving/Moderate complexity    Rehab Potential  Good    PT Frequency  2x / week    PT Duration  Other (comment)   10   PT Treatment/Interventions  Cryotherapy;Biofeedback;Moist Heat;Ultrasound;Therapeutic activities;Therapeutic exercise;Neuromuscular re-education;Manual techniques;Patient/family education;Scar mobilization;Dry needling    Consulted and Agree with Plan of Care  Patient       Patient will benefit from skilled therapeutic intervention in order to improve the following deficits and impairments:  Pain, Increased fascial restricitons, Decreased coordination, Decreased mobility, Increased muscle spasms, Decreased activity tolerance, Decreased endurance, Decreased strength, Decreased knowledge of use of DME, Postural dysfunction, Improper body mechanics, Difficulty walking  Visit Diagnosis: Muscle weakness (generalized)  Other muscle spasm  Leg length discrepancy  Diastasis recti     Problem List Patient Active Problem List   Diagnosis Date Noted  . Postpartum depression 03/01/2019  . Complex partial seizure ([place]) 03/21/2018  . Encephalomalacia 03/21/2018  . Anxiety disorder 03/21/2018  . Epilepsy ([place]) 01/30/2017  . Abnormal MRI of head 10/27/2015  . History of bacterial meningitis in infancy 10/27/2015    Jerl Mina ,PT, DPT, E-RYT  06/12/2019, 11:56 PM  [place] MAIN [facility] SERVICES [address] [place], [place], [address] Phone: [phone]   Fax:  [phone]  Name: Chelsea Collins MRN:  [id number] Date of Birth: 11-Nov-1985

## 2019-06-12 NOTE — Patient Instructions (Addendum)
        Pudendal nerve anatomy and role with sensation in vulvar area

## 2019-06-19 ENCOUNTER — Other Ambulatory Visit: Payer: Self-pay

## 2019-06-19 ENCOUNTER — Ambulatory Visit: Payer: No Typology Code available for payment source | Admitting: Physical Therapy

## 2019-06-19 DIAGNOSIS — M6281 Muscle weakness (generalized): Secondary | ICD-10-CM

## 2019-06-19 DIAGNOSIS — M2170 Unequal limb length (acquired), unspecified site: Secondary | ICD-10-CM

## 2019-06-19 DIAGNOSIS — M6208 Separation of muscle (nontraumatic), other site: Secondary | ICD-10-CM

## 2019-06-19 DIAGNOSIS — M62838 Other muscle spasm: Secondary | ICD-10-CM

## 2019-06-19 NOTE — Therapy (Signed)
[place] MAIN [facility] SERVICES [address] [place], [place], [address] Phone: [phone]   Fax:  [phone]  Physical Therapy Treatment  Patient Details  Name: Chelsea Collins MRN: [id number] Date of Birth: [birth year] Referring Provider (PT): Billey Chang    Encounter Date: 06/19/2019  PT End of Session - 06/19/19 0812    Visit Number  22    Date for PT Re-Evaluation  07/05/19    PT Start Time  0807    PT Stop Time  0904    PT Time Calculation (min)  57 min    Activity Tolerance  Patient tolerated treatment well;No increased pain    Behavior During Therapy  WFL for tasks assessed/performed       Past Medical History:  Diagnosis Date  . Anxiety disorder   . Encephalomalacia 03/21/2018   Occipital bilateral  . GAD (generalized anxiety disorder) 03/21/2018  . Heat intolerance   . Hypoglycemia   . Meningitis   . Postpartum depression 03/01/2019  . Seizures ([place])    as an infant    Past Surgical History:  Procedure Laterality Date  . EYE SURGERY    . TYMPANOSTOMY TUBE PLACEMENT      There were no vitals filed for this visit.  Subjective Assessment - 06/19/19 0811    Subjective  Pt reported she remembered to do the SLS with a chair.  With the heel raises, pt notices her hips are not as wobbly. Her ankles are wobbly not the hips.    Pertinent History  Sometimes she feels she second guesses she did not do the exercise right but she knows she did. Pt feels frustrated that she failed. 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    Patient Stated Goals  Working and walking with stroller and baby without hip, pelvic, thigh,  glut tightness and have intercourse without pain         [facility] PT Assessment - 06/19/19 0834      Coordination   Gross  Motor Movements are Fluid and Coordinated  --   ab overuse with exhalation     Ambulation/Gait   Gait Comments  180 degree, 90 deg, 45 deg  turns: genu valgus, more weight on heels. Post Tx: with more pivoting on ballmounds, demo'd no more genu valgus     No more toe in in gait                Pelvic Floor Special Questions - 06/19/19 0837    Pelvic Floor Internal Exam  consented verbally, COVID test neg , denied infections     Exam Type  Vaginal    Palpation  tightness at ATLA R, tenderness/ tightness L 5 oclock at 3rd layer     Strength  good squeeze, good lift, able to hold agaisnt strong resistance        [facility] Adult PT Treatment/Exercise - 06/19/19 0834      Therapeutic Activites    Other Therapeutic Activities  simulated task at work with 90 deg turns without genu valgus ( cued excessive, with sequence)        Neuro Re-ed    Neuro Re-ed Details   cued 400 ft with side step/ 45 deg turn with cues for lifting turning foot, aligning knees and  hips in same direction       Manual Therapy   Internal Pelvic Floor  STM/MWM at 3rd layer at problem areas noted in assessment                   PT Long Term Goals - 06/19/19 0953      PT LONG TERM GOAL #1   Title  independent with HEP and understand how to progress herself    Time  10    Period  Weeks    Status  Achieved      PT LONG TERM GOAL #2   Title  Pt will decrease her [place]  from 52 % to <25 % in order to improve QOL    Time  10    Period  Weeks    Status  On-going      PT LONG TERM GOAL #3   Title  Pt will demo proper pelvic floor lengthening, deep core coordination in order to decrease urinary frequency, pelvic pain, and improve bowel movements    Time  6    Period  Weeks    Status  Achieved      PT LONG TERM GOAL #4   Title  Pt will demo equal alignment of pelvis and equal alignment iliac crest ( no anterior Lrotation) with compliance wearing shoe lift in R shoe   in order to be able to walk to  her mailbox pushing stroller and baby    Time  2    Period  Weeks    Status  Achieved      PT LONG TERM GOAL #5   Title  Pt will report no fecal / urine leakage across 1 month in order to participate in community activities and work duties    Time  8    Period  Weeks    Status  On-going      Additional Long Term Goals   Additional Long Term Goals  Yes      PT LONG TERM GOAL #6   Title  Pt will demo proper body mechanics ( anterior tilt of pelvis in seating, baby lifting/ carrying, walking with trunk rotation, transfer floor<>stand) in order to improve mobility of lumbopelvis, increase pelvic stability to perform functional activities without straining pelvic area    Time  6    Period  Weeks    Status  Partially Met      PT LONG TERM GOAL #7   Title  Pt will demo decreased abdominal separation from 3 fingers width along linea alba and 1 knuckle depth to < 1 finger width and decreased depth in order to increase intraabdominal pressure for multiple pelvic functions.    Time  4    Period  Weeks    Status  Achieved      PT LONG TERM GOAL #8   Title  Pt will decrease her NIH-CPSI score from 65% to < 30% in order to improve pelvic floor function and QOL    Time  7    Period  Weeks    Status  On-going      PT LONG TERM GOAL  #9   TITLE  Pt will regain sensation with dull/ sharp at medial / inferior cluneal S1,2,3 on L pelvic floor in order to regain sensation for optimal pelvic floor function    Time  4    Period  Weeks    Status  Achieved      PT LONG TERM  GOAL  #10   TITLE  Pt will demo proper alignment of feet/ knee/ pelvis and no LOB with lunge position while performing resistance band exercises in order to progress with global mm strengthening without overactivity of pelvic floor mm/ pelvic malalignment 2/2 scoliosis/ leg length difference.    Time  4    Period  Weeks    Status  On-going      PT LONG TERM GOAL  #11   TITLE  PSFS for L hip pain 0pt, perineal/vaginal sensation  with sex 0pts, urinary leakage 0 pts to increase to > 5 pts in order to improve QOL (9/29: L hip pain 0--> 9, Sensation- 0--> 4, incontinence 0 --> 5 ( drizzling occasionally  and not noticing compared to tablespoon)    Time  8    Period  Weeks    Status  New    Target Date  08/14/19            Plan - 06/19/19 0911    Clinical Impression Statement  Pt reports L hip pain is resolving with only a twinge when pushing stroller and no pain with other activities like walking. Pt demo'd significantly improved balance in SLS exercises and no more toe in with gait.   Pt's urinary leakage issue is 50% improved as pt reported she noticed " drizzling occasionally  and not noticing compared to tablespoon size of leakage in the past".  Pt also reported 40% improvement with restored perineal and vaginal sensation during sexual intercourse.  Today, with internal assessment pt demo'd improved upward mobilityof pelvic floor after internal manual Tx to release minor mm tightness at 3rd layer mm.  Pt showed restored sensation with Q-tip test. Anticipate these improvements will further pt towards her goals.   Focused on minimizing genu valgus in 180, 90, 45 deg turns when walking and with functional activities. Pt required excessive verbal and visual cues and demo'd correctly post Tx. Anticipate this improvement will help with minimizing relapse of L hip pain.  Pt continues to benefit from skilled PT.       Personal Factors and Comorbidities  Past/Current Experience;Time since onset of injury/illness/exacerbation    Examination-Activity Limitations  Continence;Toileting;Other    Examination-Participation Restrictions  Community Activity    Stability/Clinical Decision Making  Evolving/Moderate complexity    Rehab Potential  Good    PT Frequency  2x / week    PT Duration  Other (comment)   10   PT Treatment/Interventions  Cryotherapy;Biofeedback;Moist Heat;Ultrasound;Therapeutic activities;Therapeutic  exercise;Neuromuscular re-education;Manual techniques;Patient/family education;Scar mobilization;Dry needling    Consulted and Agree with Plan of Care  Patient       Patient will benefit from skilled therapeutic intervention in order to improve the following deficits and impairments:  Pain, Increased fascial restricitons, Decreased coordination, Decreased mobility, Increased muscle spasms, Decreased activity tolerance, Decreased endurance, Decreased strength, Decreased knowledge of use of DME, Postural dysfunction, Improper body mechanics, Difficulty walking  Visit Diagnosis: Muscle weakness (generalized)  Other muscle spasm  Leg length discrepancy  Diastasis recti     Problem List Patient Active Problem List   Diagnosis Date Noted  . Postpartum depression 03/01/2019  . Complex partial seizure ([place]) 03/21/2018  . Encephalomalacia 03/21/2018  . Anxiety disorder 03/21/2018  . Epilepsy ([place]) 01/30/2017  . Abnormal MRI of head 10/27/2015  . History of bacterial meningitis in infancy 10/27/2015    Jerl Mina ,PT, DPT, E-RYT  06/19/2019, 10:55 AM  [place] MAIN [facility] SERVICES [address]  [place], [place], [address] Phone: [phone]   Fax:  [phone]  Name: Chelsea Collins MRN: [id number] Date of Birth: [birth year]

## 2019-06-19 NOTE — Patient Instructions (Signed)
Pivot turns at 45 deg, 90 deg , 180 deg turns:  Pivot on ballmounds, Lift the foot on the side you will turn to first , pointing toe out, not all weight on the heels   _  Zig zag walking with pivots    __  Sequential exhalation,  pelvic lfits first, low belly sinks, then upper belly sinks

## 2019-06-21 ENCOUNTER — Ambulatory Visit
Payer: No Typology Code available for payment source | Attending: Obstetrics and Gynecology | Admitting: Physical Therapy

## 2019-06-21 ENCOUNTER — Other Ambulatory Visit: Payer: Self-pay

## 2019-06-21 DIAGNOSIS — M6281 Muscle weakness (generalized): Secondary | ICD-10-CM | POA: Diagnosis present

## 2019-06-21 DIAGNOSIS — M2170 Unequal limb length (acquired), unspecified site: Secondary | ICD-10-CM | POA: Diagnosis present

## 2019-06-21 DIAGNOSIS — M6208 Separation of muscle (nontraumatic), other site: Secondary | ICD-10-CM | POA: Insufficient documentation

## 2019-06-21 DIAGNOSIS — M62838 Other muscle spasm: Secondary | ICD-10-CM | POA: Diagnosis present

## 2019-06-21 DIAGNOSIS — M533 Sacrococcygeal disorders, not elsewhere classified: Secondary | ICD-10-CM | POA: Insufficient documentation

## 2019-06-21 DIAGNOSIS — M4125 Other idiopathic scoliosis, thoracolumbar region: Secondary | ICD-10-CM | POA: Diagnosis not present

## 2019-06-21 NOTE — Therapy (Addendum)
[place] MAIN [facility] SERVICES [address] [place], [place], [address] Phone: [phone]   Fax:  [phone]  Physical Therapy Treatment  Patient Details  Name: Chelsea Collins MRN: [id number] Date of Birth: 1986-06-13 Referring Provider (PT): Billey Chang    Encounter Date: 06/21/2019  PT End of Session - 06/21/19 0853    Visit Number  23    Date for PT Re-Evaluation  07/05/19    PT Start Time  0803    PT Stop Time  0900    PT Time Calculation (min)  57 min    Activity Tolerance  Patient tolerated treatment well;No increased pain    Behavior During Therapy  WFL for tasks assessed/performed       Past Medical History:  Diagnosis Date  . Anxiety disorder   . Encephalomalacia 03/21/2018   Occipital bilateral  . GAD (generalized anxiety disorder) 03/21/2018  . Heat intolerance   . Hypoglycemia   . Meningitis   . Postpartum depression 03/01/2019  . Seizures ([place])    as an infant    Past Surgical History:  Procedure Laterality Date  . EYE SURGERY    . TYMPANOSTOMY TUBE PLACEMENT      There were no vitals filed for this visit.  Subjective Assessment - 06/21/19 0805    Subjective  Pt reported she has not noticed anything related to her Sx and she feels that is a good thing. Pt is feeling happy about her L hip. She only feels twinges when she realizes she is not walking correctly.     Pertinent History  Sometimes she feels she second guesses she did not do the exercise right but she knows she did. Pt feels frustrated that she failed. 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    Patient Stated Goals  Working and walking with stroller and baby without hip, pelvic, thigh,  glut tightness and have intercourse without pain         [facility] PT  Assessment - 06/21/19 0806      Palpation   SI assessment   tenderness/ tightenss at pirformis post internal Tx, applied MWM and pain? tightness decreased       Ambulation/Gait   Gait Comments  longer strides, arm swings, trunk rotation without cued needed                Pelvic Floor Special Questions - 06/21/19 0845    Pelvic Floor Internal Exam  consented verbally, COVID test neg , denied infections     Exam Type  Vaginal    Palpation  tightness 5 -8 oclock at 3rd layer, Pre Tx: stinginess report at L 8 o'clock  Post Tx: no stinginess     Strength  good squeeze, good lift, able to hold agaisnt strong resistance                     PT Long Term Goals - 06/19/19 0953      PT LONG TERM GOAL #1   Title  independent with HEP and understand how to progress herself    Time  10    Period  Weeks    Status  Achieved      PT LONG TERM GOAL #2   Title  Pt will decrease  her [place]  from 52 % to <25 % in order to improve QOL    Time  10    Period  Weeks    Status  On-going      PT LONG TERM GOAL #3   Title  Pt will demo proper pelvic floor lengthening, deep core coordination in order to decrease urinary frequency, pelvic pain, and improve bowel movements    Time  6    Period  Weeks    Status  Achieved      PT LONG TERM GOAL #4   Title  Pt will demo equal alignment of pelvis and equal alignment iliac crest ( no anterior Lrotation) with compliance wearing shoe lift in R shoe   in order to be 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    Time  2    Period  Weeks    Status  Achieved      PT LONG TERM GOAL #5   Title  Pt will report no fecal / urine leakage across 1 month in order to participate in community activities and work duties    Time  8    Period  Weeks    Status  On-going      Additional Long Term Goals   Additional Long Term Goals  Yes      PT LONG TERM GOAL #6   Title  Pt will demo proper body mechanics ( anterior tilt of pelvis in  seating, baby lifting/ carrying, walking with trunk rotation, transfer floor<>stand) in order to improve mobility of lumbopelvis, increase pelvic stability to perform functional activities without straining pelvic area    Time  6    Period  Weeks    Status  Partially Met      PT LONG TERM GOAL #7   Title  Pt will demo decreased abdominal separation from 3 fingers width along linea alba and 1 knuckle depth to < 1 finger width and decreased depth in order to increase intraabdominal pressure for multiple pelvic functions.    Time  4    Period  Weeks    Status  Achieved      PT LONG TERM GOAL #8   Title  Pt will decrease her NIH-CPSI score from 65% to < 30% in order to improve pelvic floor function and QOL    Time  7    Period  Weeks    Status  On-going      PT LONG TERM GOAL  #9   TITLE  Pt will regain sensation with dull/ sharp at medial / inferior cluneal S1,2,3 on L pelvic floor in order to regain sensation for optimal pelvic floor function    Time  4    Period  Weeks    Status  Achieved      PT LONG TERM GOAL  #10   TITLE  Pt will demo proper alignment of feet/ knee/ pelvis and no LOB with lunge position while performing resistance band exercises in order to progress with global mm strengthening without overactivity of pelvic floor mm/ pelvic malalignment 2/2 scoliosis/ leg length difference.    Time  4    Period  Weeks    Status  On-going      PT LONG TERM GOAL  #11   TITLE  PSFS for L hip pain 0pt, perineal/vaginal sensation with sex 0pts, urinary leakage 0 pts to increase to > 5 pts in order to improve QOL (9/29: L hip pain  0--> 9, Sensation- 0--> 4, incontinence 0 --> 5 ( drizzling occasionally  and not noticing compared to tablespoon)    Time  8    Period  Weeks    Status  New    Target Date  08/14/19            Plan - 06/21/19 2440    Clinical Impression Statement  Pt continues to make excellent progress with more sequential upward movement of pelvic floor with all  3 layers of pelvic floor to elicit a stronger pelvic floor contraction after receiving internal pelvic floor Tx to decrease perineal scar restrictions .    Still withholding endurance contractions until all tensions are absent across 2 visits. Plan to progress balance exercises and add back in [place]rabands.   Pt showed signficantly more upright posture, improved gait without requiring cues.   Pt continues to benefit from skilled PT.     Personal Factors and Comorbidities  Past/Current Experience;Time since onset of injury/illness/exacerbation    Examination-Activity Limitations  Continence;Toileting;Other    Examination-Participation Restrictions  Community Activity    Stability/Clinical Decision Making  Evolving/Moderate complexity    Rehab Potential  Good    PT Frequency  2x / week    PT Duration  Other (comment)   10   PT Treatment/Interventions  Cryotherapy;Biofeedback;Moist Heat;Ultrasound;Therapeutic activities;Therapeutic exercise;Neuromuscular re-education;Manual techniques;Patient/family education;Scar mobilization;Dry needling    Consulted and Agree with Plan of Care  Patient       Patient will benefit from skilled therapeutic intervention in order to improve the following deficits and impairments:  Pain, Increased fascial restricitons, Decreased coordination, Decreased mobility, Increased muscle spasms, Decreased activity tolerance, Decreased endurance, Decreased strength, Decreased knowledge of use of DME, Postural dysfunction, Improper body mechanics, Difficulty walking  Visit Diagnosis: Muscle weakness (generalized)  Other muscle spasm  Leg length discrepancy  Diastasis recti     Problem List Patient Active Problem List   Diagnosis Date Noted  . Postpartum depression 03/01/2019  . Complex partial seizure ([place]) 03/21/2018  . Encephalomalacia 03/21/2018  . Anxiety disorder 03/21/2018  . Epilepsy ([place]) 01/30/2017  . Abnormal MRI of head 10/27/2015  . History of  bacterial meningitis in infancy 10/27/2015    Jerl Mina ,PT, DPT, E-RYT  06/21/2019, 9:01 AM  [place] MAIN [facility] SERVICES [address] [place], [place], [address] Phone: [phone]   Fax:  [phone]  Name: Chelsea Collins MRN: [id number] Date of Birth: [birth year]

## 2019-06-26 ENCOUNTER — Other Ambulatory Visit: Payer: Self-pay

## 2019-06-26 ENCOUNTER — Ambulatory Visit: Payer: No Typology Code available for payment source | Admitting: Physical Therapy

## 2019-06-26 DIAGNOSIS — M2170 Unequal limb length (acquired), unspecified site: Secondary | ICD-10-CM

## 2019-06-26 DIAGNOSIS — M6281 Muscle weakness (generalized): Secondary | ICD-10-CM | POA: Diagnosis not present

## 2019-06-26 DIAGNOSIS — M6208 Separation of muscle (nontraumatic), other site: Secondary | ICD-10-CM

## 2019-06-26 DIAGNOSIS — M62838 Other muscle spasm: Secondary | ICD-10-CM

## 2019-06-26 NOTE — Patient Instructions (Addendum)
During menstrual cycle:  Do not do pelvic floor contractions  ( blue berry squeezes)   Do Deep core level 1, 2   Heating pad can be helpful  ___    Exercise check off   1 x day   Mon Tue Wed Thurs Fri  Sat Sun  Deep core level 1  X 1 day  Resumed blue berry contractions after period ends            Deep core level 2 x  1 day  6 min          zig zags  (strengthen hip abductors)  3 min x 1 day           Seated quad strengthening L leg  Tie green band at the knotted places  press hands down, shoulders down and back  Exhale to kick against band  20 reps x 1 day          STRETCHES               2 x day   Mon Tue Wed Thurs Fri  Sat Sun  minisquats  10reps x 2 day           Heel raises with one hand on wall  Lower slowly to still have weight on ballmounds  20 reps  X 2 x day  End with calf stretch

## 2019-06-27 NOTE — Therapy (Signed)
[place] MAIN [facility] SERVICES [address] [place], [place], [address] Phone: [phone]   Fax:  [phone]  Physical Therapy Treatment  Patient Details  Name: Chelsea Collins MRN: [id number] Date of Birth: October 26, 1985 Referring Provider (PT): Billey Chang    Encounter Date: 06/26/2019  PT End of Session - 06/26/19 0823    Visit Number  24    Date for PT Re-Evaluation  07/05/19    PT Start Time  0805    PT Stop Time  0900    PT Time Calculation (min)  55 min    Activity Tolerance  Patient tolerated treatment well;No increased pain    Behavior During Therapy  WFL for tasks assessed/performed       Past Medical History:  Diagnosis Date  . Anxiety disorder   . Encephalomalacia 03/21/2018   Occipital bilateral  . GAD (generalized anxiety disorder) 03/21/2018  . Heat intolerance   . Hypoglycemia   . Meningitis   . Postpartum depression 03/01/2019  . Seizures ([place])    as an infant    Past Surgical History:  Procedure Laterality Date  . EYE SURGERY    . TYMPANOSTOMY TUBE PLACEMENT      There were no vitals filed for this visit.  Subjective Assessment - 06/26/19 0815    Subjective  Pt reported LBP/ L hip with bending to pick something up from the floor. Pt knew it had to do with her period. Pt had never had L hip pain with her period. Pt did not do exercises this week because of the LBP.     Pertinent History  Sometimes she feels she second guesses she did not do the exercise right but she knows she did. Pt feels frustrated that she failed. 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    Patient Stated Goals  Working and walking with stroller and baby without hip, pelvic, thigh,  glut tightness and have intercourse without pain         [facility]  PT Assessment - 06/27/19 1153      Coordination   Gross Motor Movements are Fluid and Coordinated  --   R glut delayed on R.    Oblique overuse/ trunk perturbation in posterior sling test      Other:   Other/ Comments  SLS w/ deadlift w/o UE support genuvalgus , 2-3 sec.    SLS w/o UE support: 7 sec L, 25 sec R        Strength   Overall Strength Comments B hip flex 5/5 L knee flex/ext 4-/5 R 5/5 ankle DF/EV and PF/INV OKC B 5/5   L hip ext 4+/5, R 3+/5  B hip abd 4-/5  PF w/o  UE support  L 4 reps, R 2 reps                    [facility] Adult PT Treatment/Exercise - 06/27/19 1154      Therapeutic Activites    Other Therapeutic Activities  reorganized HEP into a chart to facilitate compliance , explained progress based on reassessment       Neuro Re-ed    Neuro Re-ed Details   cued for new HEP techniques ( see pt instructions)  PT Long Term Goals - 06/19/19 0953      PT LONG TERM GOAL #1   Title  independent with HEP and understand how to progress herself    Time  10    Period  Weeks    Status  Achieved      PT LONG TERM GOAL #2   Title  Pt will decrease her [place]  from 52 % to <25 % in order to improve QOL    Time  10    Period  Weeks    Status  On-going      PT LONG TERM GOAL #3   Title  Pt will demo proper pelvic floor lengthening, deep core coordination in order to decrease urinary frequency, pelvic pain, and improve bowel movements    Time  6    Period  Weeks    Status  Achieved      PT LONG TERM GOAL #4   Title  Pt will demo equal alignment of pelvis and equal alignment iliac crest ( no anterior Lrotation) with compliance wearing shoe lift in R shoe   in order to be 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    Time  2    Period  Weeks    Status  Achieved      PT LONG TERM GOAL #5   Title  Pt will report no fecal / urine leakage across 1 month in order to participate in community activities and work duties     Time  8    Period  Weeks    Status  On-going      Additional Long Term Goals   Additional Long Term Goals  Yes      PT LONG TERM GOAL #6   Title  Pt will demo proper body mechanics ( anterior tilt of pelvis in seating, baby lifting/ carrying, walking with trunk rotation, transfer floor<>stand) in order to improve mobility of lumbopelvis, increase pelvic stability to perform functional activities without straining pelvic area    Time  6    Period  Weeks    Status  Partially Met      PT LONG TERM GOAL #7   Title  Pt will demo decreased abdominal separation from 3 fingers width along linea alba and 1 knuckle depth to < 1 finger width and decreased depth in order to increase intraabdominal pressure for multiple pelvic functions.    Time  4    Period  Weeks    Status  Achieved      PT LONG TERM GOAL #8   Title  Pt will decrease her NIH-CPSI score from 65% to < 30% in order to improve pelvic floor function and QOL    Time  7    Period  Weeks    Status  On-going      PT LONG TERM GOAL  #9   TITLE  Pt will regain sensation with dull/ sharp at medial / inferior cluneal S1,2,3 on L pelvic floor in order to regain sensation for optimal pelvic floor function    Time  4    Period  Weeks    Status  Achieved      PT LONG TERM GOAL  #10   TITLE  Pt will demo proper alignment of feet/ knee/ pelvis and no LOB with lunge position while performing resistance band exercises in order to progress with global mm strengthening without overactivity of pelvic floor mm/ pelvic malalignment 2/2 scoliosis/ leg  length difference.    Time  4    Period  Weeks    Status  On-going      PT LONG TERM GOAL  #11   TITLE  PSFS for L hip pain 0pt, perineal/vaginal sensation with sex 0pts, urinary leakage 0 pts to increase to > 5 pts in order to improve QOL (9/29: L hip pain 0--> 9, Sensation- 0--> 4, incontinence 0 --> 5 ( drizzling occasionally  and not noticing compared to tablespoon)    Time  8    Period  Weeks     Status  New    Target Date  08/14/19            Plan - 06/27/19 1156    Clinical Impression Statement Pt showed signficant improvement with lower kinetic chain propioception and balance in forward lunges, more hip alignment in gait with no more toe in on L, and more trunk/ arm swing in gait without cues, and increased hip abduction strength.   Remaining deficits include:  L knee flex/ex weakness B genu valgus in SLS deadlift Standing PF still required single UE for stability Oblique overuse/ trunk perturbation in posterior sling test  Delay R glut activation in relationship to multifidis hamstring   Added seated knee extension strengthening on L today and plan to add birddog at next session but in standing position and not in quadriped in order to promote more SLS propioception while strengthening thoracolumbar and trunk stability. Plan to educate pt on pelvic floor co-activation in balance exercises.  These areas of focus will help pt minimize relapse of Sx and to account for pt's shorter leg length. HEP was reorganized into a chart to facilitate compliance and minimize overwhelm.  Pt continues to benefit from the use of regional interdependent approach. Pt is progressing towards her goals with skilled PT.      Personal Factors and Comorbidities  Past/Current Experience;Time since onset of injury/illness/exacerbation    Examination-Activity Limitations  Continence;Toileting;Other    Examination-Participation Restrictions  Community Activity    Stability/Clinical Decision Making  Evolving/Moderate complexity    Rehab Potential  Good    PT Frequency  2x / week    PT Duration  Other (comment)   10   PT Treatment/Interventions  Cryotherapy;Biofeedback;Moist Heat;Ultrasound;Therapeutic activities;Therapeutic exercise;Neuromuscular re-education;Manual techniques;Patient/family education;Scar mobilization;Dry needling    Consulted and Agree with Plan of Care  Patient       Patient  will benefit from skilled therapeutic intervention in order to improve the following deficits and impairments:  Pain, Increased fascial restricitons, Decreased coordination, Decreased mobility, Increased muscle spasms, Decreased activity tolerance, Decreased endurance, Decreased strength, Decreased knowledge of use of DME, Postural dysfunction, Improper body mechanics, Difficulty walking  Visit Diagnosis: Muscle weakness (generalized)  Other muscle spasm  Leg length discrepancy  Diastasis recti     Problem List Patient Active Problem List   Diagnosis Date Noted  . Postpartum depression 03/01/2019  . Complex partial seizure ([place]) 03/21/2018  . Encephalomalacia 03/21/2018  . Anxiety disorder 03/21/2018  . Epilepsy ([place]) 01/30/2017  . Abnormal MRI of head 10/27/2015  . History of bacterial meningitis in infancy 10/27/2015    Jerl Mina ,PT, DPT, E-RYT  06/27/2019, 12:10 PM  [place] MAIN [facility] SERVICES [address] [place], [place], [address] Phone: [phone]   Fax:  [phone]  Name: Chelsea Collins MRN: [id number] Date of Birth: 11-05-1985

## 2019-06-28 ENCOUNTER — Other Ambulatory Visit: Payer: Self-pay

## 2019-06-28 ENCOUNTER — Ambulatory Visit: Payer: No Typology Code available for payment source | Admitting: Physical Therapy

## 2019-06-28 DIAGNOSIS — M6208 Separation of muscle (nontraumatic), other site: Secondary | ICD-10-CM

## 2019-06-28 DIAGNOSIS — M6281 Muscle weakness (generalized): Secondary | ICD-10-CM

## 2019-06-28 DIAGNOSIS — M62838 Other muscle spasm: Secondary | ICD-10-CM

## 2019-06-28 DIAGNOSIS — M2170 Unequal limb length (acquired), unspecified site: Secondary | ICD-10-CM

## 2019-06-28 NOTE — Patient Instructions (Addendum)
Here is the revised exercises  Exercise check off   1 x day    Mon Tue Wed Thurs Fri  Sat Sun  Deep core level 1  X 1 day  Resumed blue berry contractions after period ends                     Deep core level 2 x  1 day  6 min                  zig zags  (strengthen hip abductors)  3 min x 1 day                       STRETCHES                           2 x day    Mon Tue Wed Thurs Fri  Sat Sun  minisquats  10reps x 2 day                    Heel raises with one hand on wall  Lower slowly to still have weight on ballmounds  20 reps  X 2 x day   End with calf stretch                    Do every other day.  Thurs, Sat, Mon, ( 3 x week)  Seated quad strengthening L leg  Tie green band at the knotted places  press hands down, shoulders down and back  Exhale to kick against band  10 reps x 1 day                 L leg by the doorway with Green band "Letter T, [place]asaw" * 10 reps

## 2019-06-28 NOTE — Therapy (Signed)
[place] MAIN [facility] SERVICES [address] [place], [place], [address] Phone: [phone]   Fax:  [phone]  Physical Therapy Treatment  Patient Details  Name: Chelsea Collins MRN: [id number] Date of Birth: August 01, 1986 Referring Provider (PT): Billey Chang    Encounter Date: 06/28/2019  PT End of Session - 06/28/19 1010    Visit Number  25    Date for PT Re-Evaluation  07/05/19    PT Start Time  0806    PT Stop Time  0903    PT Time Calculation (min)  57 min    Activity Tolerance  Patient tolerated treatment well;No increased pain    Behavior During Therapy  WFL for tasks assessed/performed       Past Medical History:  Diagnosis Date  . Anxiety disorder   . Encephalomalacia 03/21/2018   Occipital bilateral  . GAD (generalized anxiety disorder) 03/21/2018  . Heat intolerance   . Hypoglycemia   . Meningitis   . Postpartum depression 03/01/2019  . Seizures ([place])    as an infant    Past Surgical History:  Procedure Laterality Date  . EYE SURGERY    . TYMPANOSTOMY TUBE PLACEMENT      There were no vitals filed for this visit.  Subjective Assessment - 06/28/19 1009    Subjective  Pt did not get to try the band quad exercise since last session.    Pertinent History  Sometimes she feels she second guesses she did not do the exercise right but she knows she did. Pt feels frustrated that she failed. 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    Patient Stated Goals  Working and walking with stroller and baby without hip, pelvic, thigh,  glut tightness and have intercourse without pain         [facility] PT Assessment - 06/28/19 1041      Observation/Other Assessments   Observations  slumped posture, required cues for throacic extension, scapular  depression/ retraction/ cervical retraction seated HEP       Single Leg Stance   Comments  more hip adduction in SLS deadlift on L        Palpations:  Minor tightness at S2-3 level at L border of SIJ          Pelvic Floor Special Questions - 06/28/19 1043    Pelvic Floor Internal Exam  consented verbally, COVID test neg , denied infections     Exam Type  Vaginal    Palpation  tightness 3 -9 oclock at 3rd layer, Pre Tx: stinginess report at L 8 o'clock  Post Tx: no stinginess     Strength  good squeeze, good lift, able to hold agaisnt strong resistance        [facility] Adult PT Treatment/Exercise - 06/28/19 1011      Neuro Re-ed    Neuro Re-ed Details   cued for more hip abduct/ less genu valgus L  in SLS deadlift with tactile use of band and tactile/ verbal cues for proper knee alignmenti n quad exercise. required cues for throacic extension, scapular depression/ retraction/ cervical retraction         Moist Heat Therapy   Number Minutes Moist Heat  10 Minutes    Moist Heat Location  --   buttocks  during seated exercise review      Manual Therapy   Internal Pelvic Floor  STM/MWM at 3, 9 o clock 3rd layer       Manual Therapy:  STM/MWM at S2-3 level at L border of SIJ to decrease minor tightness             PT Long Term Goals - 06/19/19 0953      PT LONG TERM GOAL #1   Title  independent with HEP and understand how to progress herself    Time  10    Period  Weeks    Status  Achieved      PT LONG TERM GOAL #2   Title  Pt will decrease her [place]  from 52 % to <25 % in order to improve QOL    Time  10    Period  Weeks    Status  On-going      PT LONG TERM GOAL #3   Title  Pt will demo proper pelvic floor lengthening, deep core coordination in order to decrease urinary frequency, pelvic pain, and improve bowel movements    Time  6    Period  Weeks    Status  Achieved      PT LONG TERM GOAL #4   Title  Pt will demo equal alignment of pelvis and equal  alignment iliac crest ( no anterior Lrotation) with compliance wearing shoe lift in R shoe   in order to be 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    Time  2    Period  Weeks    Status  Achieved      PT LONG TERM GOAL #5   Title  Pt will report no fecal / urine leakage across 1 month in order to participate in community activities and work duties    Time  8    Period  Weeks    Status  On-going      Additional Long Term Goals   Additional Long Term Goals  Yes      PT LONG TERM GOAL #6   Title  Pt will demo proper body mechanics ( anterior tilt of pelvis in seating, baby lifting/ carrying, walking with trunk rotation, transfer floor<>stand) in order to improve mobility of lumbopelvis, increase pelvic stability to perform functional activities without straining pelvic area    Time  6    Period  Weeks    Status  Partially Met      PT LONG TERM GOAL #7   Title  Pt will demo decreased abdominal separation from 3 fingers width along linea alba and 1 knuckle depth to < 1 finger width and decreased depth in order to increase intraabdominal pressure for multiple pelvic functions.    Time  4    Period  Weeks    Status  Achieved      PT LONG TERM GOAL #8   Title  Pt will decrease her NIH-CPSI score from 65% to < 30% in order to improve pelvic floor function and QOL    Time  7    Period  Weeks    Status  On-going      PT LONG TERM GOAL  #9   TITLE  Pt will regain sensation with dull/ sharp at medial / inferior cluneal S1,2,3 on L pelvic floor in order to regain sensation for optimal pelvic floor function    Time  4    Period  Weeks  Status  Achieved      PT LONG TERM GOAL  #10   TITLE  Pt will demo proper alignment of feet/ knee/ pelvis and no LOB with lunge position while performing resistance band exercises in order to progress with global mm strengthening without overactivity of pelvic floor mm/ pelvic malalignment 2/2 scoliosis/ leg length difference.    Time  4     Period  Weeks    Status  On-going      PT LONG TERM GOAL  #11   TITLE  PSFS for L hip pain 0pt, perineal/vaginal sensation with sex 0pts, urinary leakage 0 pts to increase to > 5 pts in order to improve QOL (9/29: L hip pain 0--> 9, Sensation- 0--> 4, incontinence 0 --> 5 ( drizzling occasionally  and not noticing compared to tablespoon)    Time  8    Period  Weeks    Status  New    Target Date  08/14/19            Plan - 06/28/19 1015    Clinical Impression Statement  Pt demo'd significantly less pelvic floor facial restrictions at perineal scar post Tx today. Pt progressed to SLS exercise with band for tactile cue to minimize genu valgus/ strengthening L hip.Anticipate this upgrade of exercise  Will enhance pt's alignment in lower kinetic chain to minimize relapse of Sx. Withheld long holds of pelvic floor contractions still and waiting for absence of tightness/ fascial restrictions in order to minimize relapse of Sx.  Pt continues to benefit from skilled PT.    Personal Factors and Comorbidities  Past/Current Experience;Time since onset of injury/illness/exacerbation    Examination-Activity Limitations  Continence;Toileting;Other    Examination-Participation Restrictions  Community Activity    Stability/Clinical Decision Making  Evolving/Moderate complexity    Rehab Potential  Good    PT Frequency  2x / week    PT Duration  Other (comment)   10   PT Treatment/Interventions  Cryotherapy;Biofeedback;Moist Heat;Ultrasound;Therapeutic activities;Therapeutic exercise;Neuromuscular re-education;Manual techniques;Patient/family education;Scar mobilization;Dry needling    Consulted and Agree with Plan of Care  Patient       Patient will benefit from skilled therapeutic intervention in order to improve the following deficits and impairments:  Pain, Increased fascial restricitons, Decreased coordination, Decreased mobility, Increased muscle spasms, Decreased activity tolerance, Decreased  endurance, Decreased strength, Decreased knowledge of use of DME, Postural dysfunction, Improper body mechanics, Difficulty walking  Visit Diagnosis: Muscle weakness (generalized)  Other muscle spasm  Leg length discrepancy  Diastasis recti     Problem List Patient Active Problem List   Diagnosis Date Noted  . Postpartum depression 03/01/2019  . Complex partial seizure ([place]) 03/21/2018  . Encephalomalacia 03/21/2018  . Anxiety disorder 03/21/2018  . Epilepsy ([place]) 01/30/2017  . Abnormal MRI of head 10/27/2015  . History of bacterial meningitis in infancy 10/27/2015    Jerl Mina 06/28/2019, 10:43 AM  [place] MAIN [facility] SERVICES [place], [place], [address] Phone: [phone]   Fax:  [phone]  Name: Chelsea Collins MRN: [id number] Date of Birth: 1986/07/04

## 2019-07-03 ENCOUNTER — Ambulatory Visit: Payer: No Typology Code available for payment source | Admitting: Physical Therapy

## 2019-07-03 ENCOUNTER — Ambulatory Visit (INDEPENDENT_AMBULATORY_CARE_PROVIDER_SITE_OTHER): Payer: No Typology Code available for payment source | Admitting: Psychology

## 2019-07-03 ENCOUNTER — Other Ambulatory Visit: Payer: Self-pay

## 2019-07-03 DIAGNOSIS — M6208 Separation of muscle (nontraumatic), other site: Secondary | ICD-10-CM

## 2019-07-03 DIAGNOSIS — F4321 Adjustment disorder with depressed mood: Secondary | ICD-10-CM

## 2019-07-03 DIAGNOSIS — F411 Generalized anxiety disorder: Secondary | ICD-10-CM

## 2019-07-03 DIAGNOSIS — M6281 Muscle weakness (generalized): Secondary | ICD-10-CM

## 2019-07-03 DIAGNOSIS — M2170 Unequal limb length (acquired), unspecified site: Secondary | ICD-10-CM

## 2019-07-03 DIAGNOSIS — M62838 Other muscle spasm: Secondary | ICD-10-CM

## 2019-07-04 NOTE — Therapy (Signed)
[place] MAIN [facility] SERVICES [address] [place], [place], [address] Phone: [phone]   Fax:  [phone]  Physical Therapy Treatment  Patient Details  Name: Chelsea Collins MRN: [id number] Date of Birth: October 12, 1985 Referring Provider (PT): Billey Chang    Encounter Date: 07/03/2019  PT End of Session - 07/04/19 1945    Visit Number  26    Date for PT Re-Evaluation  07/05/19    PT Start Time  0804    PT Stop Time  0900    PT Time Calculation (min)  56 min    Activity Tolerance  Patient tolerated treatment well;No increased pain    Behavior During Therapy  WFL for tasks assessed/performed       Past Medical History:  Diagnosis Date  . Anxiety disorder   . Encephalomalacia 03/21/2018   Occipital bilateral  . GAD (generalized anxiety disorder) 03/21/2018  . Heat intolerance   . Hypoglycemia   . Meningitis   . Postpartum depression 03/01/2019  . Seizures ([place])    as an infant    Past Surgical History:  Procedure Laterality Date  . EYE SURGERY    . TYMPANOSTOMY TUBE PLACEMENT      There were no vitals filed for this visit.      [facility] PT Assessment - 07/04/19 1812      Coordination   Gross Motor Movements are Fluid and Coordinated  --  (Pended)    poor diassociation between shoulders/ pelvis in hip circles     Other:   Other/ Comments  seated on the floor in hip stretch, difficulty with anterior tilt, required elevation of hips on folded blanket to promote anterior tilt    (Pended)       Strength   Overall Strength Comments  hip ext L 3+/5 w/ oblique overuse, R4/5 .   (Pended)       Palpation   SI assessment   tightness at level of S2-3 L,   (Pended)       Ambulation/Gait   Gait Comments  signficantly less genu valgu L , more weight bearing in stance , trunk rotation without cues  (Pended)                                 PT Long Term Goals - 07/04/19 1946      PT LONG TERM GOAL #1   Title   independent with HEP and understand how to progress herself    Time  10    Period  Weeks    Status  Achieved      PT LONG TERM GOAL #2   Title  Pt will decrease her [place]  from 52 % to <25 % in order to improve QOL    Time  10    Period  Weeks    Status  On-going      PT LONG TERM GOAL #3   Title  Pt will demo proper pelvic floor lengthening, deep core coordination in order to decrease urinary frequency, pelvic pain, and improve bowel movements    Time  6    Period  Weeks    Status  Achieved      PT LONG TERM GOAL #4   Title  Pt will demo equal alignment of pelvis and equal alignment iliac crest ( no anterior Lrotation) with compliance wearing shoe lift in R shoe   in order to be  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    Time  2    Period  Weeks    Status  Achieved      PT LONG TERM GOAL #5   Title  Pt will report no fecal / urine leakage across 1 month in order to participate in community activities and work duties    Time  8    Period  Weeks    Status  On-going      PT [place] #6   Title  Pt will demo proper body mechanics ( anterior tilt of pelvis in seating, baby lifting/ carrying, walking with trunk rotation, transfer floor<>stand) in order to improve mobility of lumbopelvis, increase pelvic stability to perform functional activities without straining pelvic area    Time  6    Period  Weeks    Status  Partially Met      PT LONG TERM GOAL #7   Title  Pt will demo decreased abdominal separation from 3 fingers width along linea alba and 1 knuckle depth to < 1 finger width and decreased depth in order to increase intraabdominal pressure for multiple pelvic functions.    Time  4    Period  Weeks    Status  Achieved      PT LONG TERM GOAL #8   Title  Pt will decrease her NIH-CPSI score from 65% to < 30% in order to improve pelvic floor function and QOL    Time  7    Period  Weeks    Status  On-going      PT LONG TERM GOAL  #9   TITLE  Pt will regain  sensation with dull/ sharp at medial / inferior cluneal S1,2,3 on L pelvic floor in order to regain sensation for optimal pelvic floor function    Time  4    Period  Weeks    Status  Achieved      PT LONG TERM GOAL  #10   TITLE  Pt will demo proper alignment of feet/ knee/ pelvis and no LOB with lunge position while performing resistance band exercises in order to progress with global mm strengthening without overactivity of pelvic floor mm/ pelvic malalignment 2/2 scoliosis/ leg length difference.    Time  4    Period  Weeks    Status  On-going      PT LONG TERM GOAL  #11   TITLE  PSFS for L hip pain 0pt, perineal/vaginal sensation with sex 0pts, urinary leakage 0 pts to increase to > 5 pts in order to improve QOL (9/29: L hip pain 0--> 9, Sensation- 0--> 4, incontinence 0 --> 5 ( drizzling occasionally  and not noticing compared to tablespoon)    Time  8    Period  Weeks    Status  New            Plan - 07/04/19 1946    Clinical Impression Statement Last session caused a flare up of pt's L hip with report of pulling sensation.Pt had experienced no L hip pain before since [facility] and appears to have flareup when new exercises are introduced, especially with resistance bands.  Therefore, will continue to withhold resistance band exercises. Pt was able to self manage over the week with the stretches and report it subsided after one week but pt expressed feeling anxious and frustrated about this pain.    Manual Tx was applied to minimzie tightness at L S2-3  area.  Genu valgus is absent in gait on L LE  and therefore,indicating lower kinetic chain alignment has improved accounting for previous shorter L leg length prior to correction with shoe lift and other strengthening exercises. L hip ext remains weaker than R with overuse of obliques. Plan to address multifidis/ glut strengthening in standing position without bands at upcoming session if appropriate but primarily plan to focus on less  introduction of new HEP until pt's L hip pain decreases.   Guided pt on modification to a floor stretch where she notices pulling sensation in at her sacrum by providing elevation of her hip with folded blanket to promote anterior tilt of the pelvis. Pt continued to show tightness at thoracic region as a limiting factor to difficulty with anterior tilt position. Plan to address at this area next session.   Provided pelvic propioception exercises with small hip circles which pt demo'd improved propioception between shoulders and pelvis/ LE after cues.   Pt continues to benefit from skilled PT. Plan to recert and revisit goals at next session.     Personal Factors and Comorbidities  Past/Current Experience;Time since onset of injury/illness/exacerbation    Examination-Activity Limitations  Continence;Toileting;Other    Examination-Participation Restrictions  Community Activity    Stability/Clinical Decision Making  Evolving/Moderate complexity    Rehab Potential  Good    PT Frequency  2x / week    PT Duration  Other (comment)   10   PT Treatment/Interventions  Cryotherapy;Biofeedback;Moist Heat;Ultrasound;Therapeutic activities;Therapeutic exercise;Neuromuscular re-education;Manual techniques;Patient/family education;Scar mobilization;Dry needling    Consulted and Agree with Plan of Care  Patient       Patient will benefit from skilled therapeutic intervention in order to improve the following deficits and impairments:  Pain, Increased fascial restricitons, Decreased coordination, Decreased mobility, Increased muscle spasms, Decreased activity tolerance, Decreased endurance, Decreased strength, Decreased knowledge of use of DME, Postural dysfunction, Improper body mechanics, Difficulty walking  Visit Diagnosis: Muscle weakness (generalized)  Other muscle spasm  Leg length discrepancy  Diastasis recti     Problem List Patient Active Problem List   Diagnosis Date Noted  . Postpartum  depression 03/01/2019  . Complex partial seizure ([place]) 03/21/2018  . Encephalomalacia 03/21/2018  . Anxiety disorder 03/21/2018  . Epilepsy ([place]) 01/30/2017  . Abnormal MRI of head 10/27/2015  . History of bacterial meningitis in infancy 10/27/2015    Jerl Mina ,PT, DPT, E-RYT   07/04/2019, 7:47 PM  [place] MAIN [facility] SERVICES [address] [place], [place], [address] Phone: [phone]   Fax:  [phone]  Name: Basha Krygier MRN: [id number] Date of Birth: [birth year]

## 2019-07-05 ENCOUNTER — Other Ambulatory Visit: Payer: Self-pay

## 2019-07-05 ENCOUNTER — Ambulatory Visit: Payer: No Typology Code available for payment source | Admitting: Physical Therapy

## 2019-07-05 DIAGNOSIS — M2170 Unequal limb length (acquired), unspecified site: Secondary | ICD-10-CM

## 2019-07-05 DIAGNOSIS — M6208 Separation of muscle (nontraumatic), other site: Secondary | ICD-10-CM

## 2019-07-05 DIAGNOSIS — M62838 Other muscle spasm: Secondary | ICD-10-CM

## 2019-07-05 DIAGNOSIS — M6281 Muscle weakness (generalized): Secondary | ICD-10-CM

## 2019-07-05 NOTE — Patient Instructions (Addendum)
Here is the revised exercises  Exercise check off  1 x day   Mon Tue Wed Thurs Fri  Sat Sun  Deep core level 1 X 1 day  Resumed blue berry contractions after period ends            Deep core level 2 x 1 day  6 min          zig zags (strengthen hip abductors)  3 min x 1 day             STRETCHES               2 x day   Mon Tue Wed Thurs Fri  Sat Sun  minisquats  10reps x 2 day           Heel raises with one hand on wall Lower slowly to still have weight on ballmounds  20 reps X 2 x day  End with calf stretch           _____   Supportive lifestyle practices for energy conservation and rejuvenation   Get to the floor with downward facing dog to get a back stretch  Prop legs up on the couch,  allow deep rest for 10 min    2 different soothing alarms for 5 min, 10 min   Get up downward facing down  3 min zig zag walking   ___  To avoid falling asleep on the couch   At 8pm  Turn off TV/ cell phone off 2 hours before  Bed  Choose music for windown

## 2019-07-05 NOTE — Therapy (Signed)
[place] MAIN [facility] SERVICES [address] [place], [place], [address] Phone: [phone]   Fax:  [phone]  Physical Therapy Treatment  Patient Details  Name: Chelsea Collins MRN: [id number] Date of Birth: 1985-12-18 Referring Provider (PT): Billey Chang    Encounter Date: 07/05/2019  PT End of Session - 07/05/19 1733    Visit Number  27    Date for PT Re-Evaluation  07/05/19    PT Start Time  0803    PT Stop Time  0900    PT Time Calculation (min)  57 min    Activity Tolerance  Patient tolerated treatment well;No increased pain    Behavior During Therapy  WFL for tasks assessed/performed       Past Medical History:  Diagnosis Date  . Anxiety disorder   . Encephalomalacia 03/21/2018   Occipital bilateral  . GAD (generalized anxiety disorder) 03/21/2018  . Heat intolerance   . Hypoglycemia   . Meningitis   . Postpartum depression 03/01/2019  . Seizures ([place])    as an infant    Past Surgical History:  Procedure Laterality Date  . EYE SURGERY    . TYMPANOSTOMY TUBE PLACEMENT      There were no vitals filed for this visit.  Subjective Assessment - 07/05/19 0813    Subjective  Pt noticed she was at home yesterday and she did not have pain. Pt was at work  for 4 hours and come home and she notices the soreness.  Sometimes within the 2-3 hours of the 4hours she works and more so at the end of the day, pt will notice the soreness coming on. The soreness she feels at work is dependent on the type of activities. When there is more walking and activities that is more physical ( tossing balloon, noodle ball), she feels a flare up.  "Right now it feels different from the pulling sensation and pain. It is a precursor. Maybe my muscles are tight and if I continue the motion for a while, then there maybe something I need to stop. But it is not pain"  Pt has been falling asleeping on her couch 4 days out of the week. Pt feels sleepy this morning. Pt  has been doing stretches at work. Pt eats dinner on the couch. Pt has been watching TV "alot" 5-6pm to 10 pm. Pt wakes up from the couch and then walk the bed. Occassionally one or twice she had slept on the couch until 5am .  Before COVID times, [place] and was walking with her little girl. Now in this apt where she lives, there are no sidewalks that are handicap accessible.    Pertinent History  Sometimes she feels she second guesses she did not do the exercise right but she knows she did. Pt feels frustrated that she failed. 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    Patient Stated Goals  Working and walking with stroller and baby without hip, pelvic, thigh,  glut tightness and have intercourse without pain         [facility] PT Assessment - 07/06/19 0909      Observation/Other Assessments   Observations  upright posture, feet placement without cues.       Floor to  Stand   Comments  no genu valgus      Strength   Overall Strength Comments  --      Palpation   Palpation comment  --                   [facility] Adult PT Treatment/Exercise - 07/05/19 1712      Ambulation/Gait   Gait Comments  signficantly less genu valgu L , more weight bearing in stance , trunk rotation without cues      Therapeutic Activites    Other Therapeutic Activities  discussed sleep hygiene to decrease falling asleep on couch, discussed role of therapist to assist with pt's progress, therapist intention is not to injure, discussed strategies for energy regeneration after work to avoid falling asleep after work by using relaxation techniques          Neuro Re-ed    Neuro Re-ed Details   cued for hip abd slightly in heel raises to abduct patella                   PT  Long Term Goals - 07/04/19 1946      PT LONG TERM GOAL #1   Title  independent with HEP and understand how to progress herself    Time  10    Period  Weeks    Status  Achieved      PT LONG TERM GOAL #2   Title  Pt will decrease her [place]  from 52 % to <25 % in order to improve QOL    Time  10    Period  Weeks    Status  On-going      PT LONG TERM GOAL #3   Title  Pt will demo proper pelvic floor lengthening, deep core coordination in order to decrease urinary frequency, pelvic pain, and improve bowel movements    Time  6    Period  Weeks    Status  Achieved      PT LONG TERM GOAL #4   Title  Pt will demo equal alignment of pelvis and equal alignment iliac crest ( no anterior Lrotation) with compliance wearing shoe lift in R shoe   in order to be 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    Time  2    Period  Weeks    Status  Achieved      PT LONG TERM GOAL #5   Title  Pt will report no fecal / urine leakage across 1 month in order to participate in community activities and work duties    Time  8    Period  Weeks    Status  On-going      PT LONG TERM GOAL #6   Title  Pt will demo proper body mechanics ( anterior tilt of pelvis in seating, baby lifting/ carrying, walking with trunk rotation, transfer floor<>stand) in order to improve mobility of lumbopelvis, increase pelvic stability to perform functional activities without straining pelvic area    Time  6    Period  Weeks    Status  Partially Met      PT LONG TERM GOAL #7   Title  Pt will demo decreased abdominal separation from 3 fingers width along linea alba and 1 knuckle depth to < 1 finger width and decreased depth in order to increase intraabdominal pressure for multiple pelvic functions.    Time  4    Period  Weeks  Status  Achieved      PT LONG TERM GOAL #8   Title  Pt will decrease her NIH-CPSI score from 65% to < 30% in order to improve pelvic floor function and QOL    Time  7    Period  Weeks     Status  On-going      PT LONG TERM GOAL  #9   TITLE  Pt will regain sensation with dull/ sharp at medial / inferior cluneal S1,2,3 on L pelvic floor in order to regain sensation for optimal pelvic floor function    Time  4    Period  Weeks    Status  Achieved      PT LONG TERM GOAL  #10   TITLE  Pt will demo proper alignment of feet/ knee/ pelvis and no LOB with lunge position while performing resistance band exercises in order to progress with global mm strengthening without overactivity of pelvic floor mm/ pelvic malalignment 2/2 scoliosis/ leg length difference.    Time  4    Period  Weeks    Status  On-going      PT LONG TERM GOAL  #11   TITLE  PSFS for L hip pain 0pt, perineal/vaginal sensation with sex 0pts, urinary leakage 0 pts to increase to > 5 pts in order to improve QOL (9/29: L hip pain 0--> 9, Sensation- 0--> 4, incontinence 0 --> 5 ( drizzling occasionally  and not noticing compared to tablespoon)    Time  8    Period  Weeks    Status  New            Plan - 07/05/19 1734    Clinical Impression Statement Pt was provided biopsychosocial approaches today with active listening, compassion, and collaborated with pt on strategies to minimize relapse of L hip achiness.  With further questioning,pt reported she has been falling asleep on her couch 4 out of 7 days. Pt also explained she noticed her L hip achiness occurs  2-3 hours of the 4hours she works and more so at the end of the day, pt will notice the soreness coming on. The soreness she feels at work is dependent on the type of activities. When there is more walking and activities that is more physical ( tossing balloon, noodle ball), she feels a flare up.  "Right now it feels different from the pulling sensation and pain. It is a precursor. Maybe my muscles are tight and if I continue the motion for a while, then there maybe something I need to stop. But it is not pain"     Discussed sleep hygiene to decrease falling  asleep on couch,Discussed strategies for energy regeneration after work to avoid falling asleep after work by using relaxation techniques.  Discussed role of therapist to assist with pt's progress and to continue building pt's endurance and mm conditioning,  explained therapist's intention is not to cause injuries and is collaborating with pt's individualized problems with proper exercise routine to improve her Sx. These efforts were made to preserve therapeutic alliance with pt and to address pt's frustration with the achiness of L hip when resistance bands are introduced into HEP. Discussed resistance bands will not be added to HEP moving forward and other types of strengthening exercises will be applied to HEP in a customized way. Plan to utilize body weight strengthening strengthening and avoid resistance bands due to pt having negative response to resistance bands the past two incidence when they were introduced  into HEP.    Pt voiced understanding and agreed to sleep hygiene and relaxation strategies. Resistance bands exercises were removed from HEP. Provided cues for improved knee alignment in descent of heel raise exercises. Pt demo'd proper technique post Tx.  Pt continues to benefit from skilled PT. Need to reassess goals and recert next session               Personal Factors and Comorbidities  Past/Current Experience;Time since onset of injury/illness/exacerbation    Examination-Activity Limitations  Continence;Toileting;Other    Examination-Participation Restrictions  Community Activity    Stability/Clinical Decision Making  Evolving/Moderate complexity    Rehab Potential  Good    PT Frequency  2x / week    PT Duration  Other (comment)   10   PT Treatment/Interventions  Cryotherapy;Biofeedback;Moist Heat;Ultrasound;Therapeutic activities;Therapeutic exercise;Neuromuscular re-education;Manual techniques;Patient/family education;Scar mobilization;Dry needling    Consulted and Agree  with Plan of Care  Patient       Patient will benefit from skilled therapeutic intervention in order to improve the following deficits and impairments:  Pain, Increased fascial restricitons, Decreased coordination, Decreased mobility, Increased muscle spasms, Decreased activity tolerance, Decreased endurance, Decreased strength, Decreased knowledge of use of DME, Postural dysfunction, Improper body mechanics, Difficulty walking  Visit Diagnosis: Other muscle spasm  Diastasis recti  Muscle weakness (generalized)  Leg length discrepancy     Problem List Patient Active Problem List   Diagnosis Date Noted  . Postpartum depression 03/01/2019  . Complex partial seizure ([place]) 03/21/2018  . Encephalomalacia 03/21/2018  . Anxiety disorder 03/21/2018  . Epilepsy ([place]) 01/30/2017  . Abnormal MRI of head 10/27/2015  . History of bacterial meningitis in infancy 10/27/2015    Jerl Mina 07/06/2019, 9:37 AM  [place] MAIN [facility] SERVICES [place], [place], [address] Phone: [phone]   Fax:  [phone]  Name: Chelsea Collins MRN: [id number] Date of Birth: 31-Oct-1985

## 2019-07-10 ENCOUNTER — Ambulatory Visit: Payer: No Typology Code available for payment source | Admitting: Physical Therapy

## 2019-07-10 ENCOUNTER — Other Ambulatory Visit: Payer: Self-pay

## 2019-07-10 DIAGNOSIS — M6281 Muscle weakness (generalized): Secondary | ICD-10-CM | POA: Diagnosis not present

## 2019-07-10 DIAGNOSIS — M62838 Other muscle spasm: Secondary | ICD-10-CM

## 2019-07-10 DIAGNOSIS — M2170 Unequal limb length (acquired), unspecified site: Secondary | ICD-10-CM

## 2019-07-10 DIAGNOSIS — M6208 Separation of muscle (nontraumatic), other site: Secondary | ICD-10-CM

## 2019-07-10 NOTE — Patient Instructions (Addendum)
    Exercise check off  1 x day   Mon Tue Wed Thurs Fri  Sat Sun  Deep core level 1 X 1 day  Resumed blue berry contractions after period ends            Deep core level 2 x 1 day  6 min           zig zags (strengthen hip abductors)  3 min x 1 day ______  OR  Step up with L , then R, Lower with R, then L , one on rail  2 min with leg               STRETCHES               2 x day   Mon Tue Wed Thurs Fri  Sat Sun   minisquats  10reps x 2 day          Finger tip on wall Single leg balance, 30 sec, Both leg  Gaze straight ahead           Heel raises with one hand on wall lower slowly to still have weight on ballmounds  20 reps X 2 x day    End with calf stretch

## 2019-07-10 NOTE — Therapy (Addendum)
[place] MAIN [facility] SERVICES [address] [place], [place], [address] Phone: [phone]   Fax:  [phone]  Physical Therapy Treatment/ Progress Note   Patient Details  Name: Chelsea Collins MRN: [id number] Date of Birth: 1986/05/17 Referring Provider (PT): Billey Chang    Encounter Date: 07/10/2019    Past Medical History:  Diagnosis Date  . Anxiety disorder   . Encephalomalacia 03/21/2018   Occipital bilateral  . GAD (generalized anxiety disorder) 03/21/2018  . Heat intolerance   . Hypoglycemia   . Meningitis   . Postpartum depression 03/01/2019  . Seizures ([place])    as an infant    Past Surgical History:  Procedure Laterality Date  . EYE SURGERY    . TYMPANOSTOMY TUBE PLACEMENT      There were no vitals filed for this visit.  Subjective Assessment - 07/10/19 0818    Subjective  (S) Pt reported a decrease fatigue 7-8/10 to 4/10 after having a few days off from work and relief from caring for her child.  Pt only fell asleep on her couch one time last week. Pt has concluded on a path for another job and higher education which has helped pt not to feel as frustrated.  Pt reported she had to play kickball with one resident in a WC at the assisted living facility. Pt felt he was in a better shape than him. Pt had to take a rest after 5 min. Pt noticed her L hip sore and hurt a little.      Pertinent History  Sometimes she feels she second guesses she did not do the exercise right but she knows she did. Pt feels frustrated that she failed. 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    Patient Stated Goals  Working and walking with stroller and baby without hip, pelvic, thigh,  glut tightness and have intercourse without pain         [facility] PT  Assessment - 07/15/19 1554      Observation/Other Assessments   Observations  R toe in, seat posture without cuing for upright position      Strength   Overall Strength Comments  without UE support,  PF 4/5 Pt will demo increased SLS balance at 9 sec on R, 10 sec  on L                    [facility] Adult PT Treatment/Exercise - 07/15/19 1554      Therapeutic Activites    Other Therapeutic Activities  reassessed goals. discussed new goals       Neuro Re-ed    Neuro Re-ed Details   cued for slower eccentric control of planat fascia on step down/up exercises                  PT Long Term Goals - 07/10/19 0809      PT LONG TERM GOAL #1   Title  independent with HEP and understand how to progress herself    Time  10    Period  Weeks    Status  Achieved      PT LONG TERM GOAL #2   Title  Pt will decrease her [place]  from 52 % to <25 % in order to improve QOL (  10/20: 30%)    Time  10    Period  Weeks    Status  Partially Met      PT LONG TERM GOAL #3   Title  Pt will demo proper pelvic floor lengthening, deep core coordination in order to decrease urinary frequency, pelvic pain, and improve bowel movements    Time  6    Period  Weeks    Status  Achieved      PT LONG TERM GOAL #4   Title  Pt will demo equal alignment of pelvis and equal alignment iliac crest ( no anterior Lrotation) with compliance wearing shoe lift in R shoe   in order to be 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    Time  2    Period  Weeks    Status  Achieved      PT LONG TERM GOAL #5   Title  Pt will report no fecal / urine leakage across 1 month in order to participate in community activities and work duties    Baseline  10/20 reassessment :  2 x month    Time  8    Period  Weeks    Status  Partially Met      Additional Long Term Goals   Additional Long Term Goals  Yes      PT LONG TERM GOAL #6   Title  Pt will demo proper body mechanics ( anterior tilt of pelvis in  seating, baby lifting/ carrying, walking with trunk rotation, transfer floor<>stand) in order to improve mobility of lumbopelvis, increase pelvic stability to perform functional activities without straining pelvic area    Time  6    Period  Weeks    Status  Achieved      PT LONG TERM GOAL #7   Title  Pt will demo decreased abdominal separation from 3 fingers width along linea alba and 1 knuckle depth to < 1 finger width and decreased depth in order to increase intraabdominal pressure for multiple pelvic functions.    Time  4    Period  Weeks    Status  Achieved      PT LONG TERM GOAL #8   Title  Pt will decrease her NIH-CPSI score from 65% to < 30% in order to improve pelvic floor function and QOL ( 10/20 : 32% )    Time  7    Period  Weeks    Status  Achieved      PT LONG TERM GOAL  #9   TITLE  Pt will regain sensation with dull/ sharp at medial / inferior cluneal S1,2,3 on L pelvic floor in order to regain sensation for optimal pelvic floor function    Time  4    Period  Weeks    Status  Achieved      PT LONG TERM GOAL  #10   TITLE  Pt will demo proper alignment of feet/ knee/ pelvis and no LOB with lunge position while performing resistance band exercises in order to progress with global mm strengthening without overactivity of pelvic floor mm/ pelvic malalignment 2/2 scoliosis/ leg length difference.    Time  4    Period  Weeks    Status  Achieved      PT LONG TERM GOAL  #11   TITLE  PSFS for L hip pain 0pt, perineal/vaginal sensation with sex 0pts, urinary leakage 0 pts to increase to > 5 pts in order to  improve QOL  9/29:  L hip pain 0--> 9,  Sensation- 0--> 4,  incontinence 0 --> 5 ( "drizzling occasionally  and not noticing compared to tablespoon"    Baseline  10/20:  8/10 for all three categories  L hip pain 0--> 8,  Sensation- 0--> 8,  incontinence 0 --> 8      Time  8    Period  Weeks    Status  Partially Met      PT LONG TERM GOAL  #12   TITLE  Pt will demo  increased SLS balance at 9 sec on R, 10 sec  on L to > 20 sec without hand on wall in order to play ball wihr dtr    Time  10    Period  Weeks    Status  New    Target Date  09/18/19            Plan - 07/15/19 1554    Clinical Impression Pt has achieved 8 out of 12 goals and is progressing well since starting Pelvic PT on 03/02/19 and completing 28 visits. Pt's  improvements include:  _signficantly decreased L hip pain, increased sensation in perineal area, and less urinary incontinence   _equal pelvic alignment, more mobility in SIJ, equal leg length with shoe lift in R shoe, significantly less L genu valgus, less R toe in, increased trunk mobility, increased gait speed, postural stability, resolved diastasis recti, increased hip strength which contributed to the above three goals that were meaningful for pt.  Remaining deficits that will be further addressed include: - R quad, glut weakness - B dynamic and static balance  - further conditioning/ strengthening to help pt endure lifting her growing infant   Pt fares better with body weight strengthening. Resistance bands will be withheld because pt had relapse of L hip after two occasions. Pt also benefited from biopsychosocial approaches and explanations on the etiology of her Sx.  Regional interdependent approach will continue to yield greater outcomes given her leg length difference and lower kinetic chain deficits.   Pt continues to benefit from skilled PT.        Personal Factors and Comorbidities  Past/Current Experience;Time since onset of injury/illness/exacerbation    Examination-Activity Limitations  Continence;Toileting;Other    Examination-Participation Restrictions  Community Activity    Stability/Clinical Decision Making  Evolving/Moderate complexity    Rehab Potential  Good    PT Frequency  2x / week    PT Duration  Other (comment)   10   PT Treatment/Interventions  Cryotherapy;Biofeedback;Moist  Heat;Ultrasound;Therapeutic activities;Therapeutic exercise;Neuromuscular re-education;Manual techniques;Patient/family education;Scar mobilization;Dry needling    Consulted and Agree with Plan of Care  Patient       Patient will benefit from skilled therapeutic intervention in order to improve the following deficits and impairments:  Pain, Increased fascial restricitons, Decreased coordination, Decreased mobility, Increased muscle spasms, Decreased activity tolerance, Decreased endurance, Decreased strength, Decreased knowledge of use of DME, Postural dysfunction, Improper body mechanics, Difficulty walking  Visit Diagnosis: Diastasis recti  Muscle weakness (generalized)  Other muscle spasm  Leg length discrepancy     Problem List Patient Active Problem List   Diagnosis Date Noted  . Postpartum depression 03/01/2019  . Complex partial seizure ([place]) 03/21/2018  . Encephalomalacia 03/21/2018  . Anxiety disorder 03/21/2018  . Epilepsy ([place]) 01/30/2017  . Abnormal MRI of head 10/27/2015  . History of bacterial meningitis in infancy 10/27/2015    Jerl Mina ,PT, DPT, E-RYT  07/15/2019, 3:55 PM  [place] MAIN [facility] SERVICES [address] [place], [place], [address] Phone: [phone]   Fax:  [phone]  Name: Felcia Huebert MRN: [id number] Date of Birth: [birth year]

## 2019-07-12 ENCOUNTER — Encounter: Payer: No Typology Code available for payment source | Admitting: Physical Therapy

## 2019-07-15 NOTE — Addendum Note (Signed)
Addended by: Jerl Mina on: 07/15/2019 04:14 PM   Modules accepted: Orders

## 2019-07-17 ENCOUNTER — Ambulatory Visit: Payer: No Typology Code available for payment source | Admitting: Physical Therapy

## 2019-07-17 ENCOUNTER — Other Ambulatory Visit: Payer: Self-pay

## 2019-07-17 DIAGNOSIS — M6208 Separation of muscle (nontraumatic), other site: Secondary | ICD-10-CM

## 2019-07-17 DIAGNOSIS — M6281 Muscle weakness (generalized): Secondary | ICD-10-CM | POA: Diagnosis not present

## 2019-07-17 DIAGNOSIS — M62838 Other muscle spasm: Secondary | ICD-10-CM

## 2019-07-17 DIAGNOSIS — M2170 Unequal limb length (acquired), unspecified site: Secondary | ICD-10-CM

## 2019-07-17 NOTE — Therapy (Signed)
[place] MAIN [facility] SERVICES [address] [place], [place], [address] Phone: [phone]   Fax:  [phone]  Physical Therapy Treatment  Patient Details  Name: Chelsea Collins MRN: [id number] Date of Birth: 1986-01-20 Referring Provider (PT): Billey Chang    Encounter Date: 07/17/2019  PT End of Session - 07/17/19 0809    Visit Number  [id number]    Date for PT Re-Evaluation  09/18/19    PT Start Time  0806    PT Stop Time  0900    PT Time Calculation (min)  54 min    Activity Tolerance  Patient tolerated treatment well;No increased pain    Behavior During Therapy  WFL for tasks assessed/performed       Past Medical History:  Diagnosis Date  . Anxiety disorder   . Encephalomalacia 03/21/2018   Occipital bilateral  . GAD (generalized anxiety disorder) 03/21/2018  . Heat intolerance   . Hypoglycemia   . Meningitis   . Postpartum depression 03/01/2019  . Seizures ([place])    as an infant    Past Surgical History:  Procedure Laterality Date  . EYE SURGERY    . TYMPANOSTOMY TUBE PLACEMENT      There were no vitals filed for this visit.  Subjective Assessment - 07/17/19 0809    Subjective  Pt practiced her exercises 2x last week. Pt had no difficulty with them.    Pertinent History  Sometimes she feels she second guesses she did not do the exercise right but she knows she did. Pt feels frustrated that she failed. 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    Patient Stated Goals  Working and walking with stroller and baby without hip, pelvic, thigh,  glut tightness and have intercourse without pain         [facility] PT Assessment - 07/17/19 0923      Squat   Comments  minor cues for knee posterior to toes       Other:   Other/ Comments  cued for  eccentric control in step down, tactile cues for knee alignment to minimize genu valgus         Other:   Other/Comments  all dynamic/ static balance exercises still required single UE support        Palpation: external palpation through clothing: no pelvic floor tightness and proper lengthening and contraction              [facility] Adult PT Treatment/Exercise - 07/17/19 0810      Neuro Re-ed    Neuro Re-ed Details   cued for alignment and technique in HEP       explained the importance of static and dynamic balance training  Explained the glut mm will be activated and will notice it getting used but not mean pain             PT Long Term Goals - 07/10/19 0809      PT LONG TERM GOAL #1   Title  independent with HEP and understand how to progress herself    Time  10    Period  Weeks    Status  Achieved      PT LONG TERM GOAL #2   Title  Pt will decrease her [place]  from  52 % to <25 % in order to improve QOL ( 10/20: 30%)    Time  10    Period  Weeks    Status  Partially Met      PT LONG TERM GOAL #3   Title  Pt will demo proper pelvic floor lengthening, deep core coordination in order to decrease urinary frequency, pelvic pain, and improve bowel movements    Time  6    Period  Weeks    Status  Achieved      PT LONG TERM GOAL #4   Title  Pt will demo equal alignment of pelvis and equal alignment iliac crest ( no anterior Lrotation) with compliance wearing shoe lift in R shoe   in order to be 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    Time  2    Period  Weeks    Status  Achieved      PT LONG TERM GOAL #5   Title  Pt will report no fecal / urine leakage across 1 month in order to participate in community activities and work duties    Baseline  10/20 reassessment :  2 x month    Time  8    Period  Weeks    Status  Partially Met      Additional Long Term Goals   Additional Long Term Goals  Yes      PT LONG TERM GOAL #6   Title  Pt will demo  proper body mechanics ( anterior tilt of pelvis in seating, baby lifting/ carrying, walking with trunk rotation, transfer floor<>stand) in order to improve mobility of lumbopelvis, increase pelvic stability to perform functional activities without straining pelvic area    Time  6    Period  Weeks    Status  Achieved      PT LONG TERM GOAL #7   Title  Pt will demo decreased abdominal separation from 3 fingers width along linea alba and 1 knuckle depth to < 1 finger width and decreased depth in order to increase intraabdominal pressure for multiple pelvic functions.    Time  4    Period  Weeks    Status  Achieved      PT LONG TERM GOAL #8   Title  Pt will decrease her NIH-CPSI score from 65% to < 30% in order to improve pelvic floor function and QOL ( 10/20 : 32% )    Time  7    Period  Weeks    Status  Achieved      PT LONG TERM GOAL  #9   TITLE  Pt will regain sensation with dull/ sharp at medial / inferior cluneal S1,2,3 on L pelvic floor in order to regain sensation for optimal pelvic floor function    Time  4    Period  Weeks    Status  Achieved      PT LONG TERM GOAL  #10   TITLE  Pt will demo proper alignment of feet/ knee/ pelvis and no LOB with lunge position while performing resistance band exercises in order to progress with global mm strengthening without overactivity of pelvic floor mm/ pelvic malalignment 2/2 scoliosis/ leg length difference.    Time  4    Period  Weeks    Status  Achieved      PT LONG TERM GOAL  #11   TITLE  PSFS for L hip pain 0pt, perineal/vaginal sensation with sex 0pts, urinary leakage  0 pts to increase to > 5 pts in order to improve QOL (9/29: L hip pain 0--> 9, Sensation- 0--> 4, incontinence 0 --> 5 ( drizzling occasionally  and not noticing compared to tablespoon)    Baseline  10/20:  8/10 for all three categories    Time  8    Period  Weeks    Status  Partially Met      PT LONG TERM GOAL  #12   TITLE  Pt will demo increased SLS balance at 9  sec on R, 10 sec  on L to > 20 sec without hand on wall in order to play ball wihr dtr    Time  10    Period  Weeks    Status  New    Target Date  09/18/19            Plan - 07/17/19 0809    Clinical Impression Statement  Reviewed, modified, performed, and provided cues for proper alignment and technique for HEP to promote more compliance as pt reported she only did her HEP 2x last week. All dynamic/ static balance exercises still required single UE support but pt is demonstrating less genu valgus which is a sign of progress. Pt also showed no pelvic floor tightness but pt required cues for not crossing legs to minimize relapse of overactivity of pelvic floor.  Pt continues to benefit from skilled PT.      Personal Factors and Comorbidities  Past/Current Experience;Time since onset of injury/illness/exacerbation    Examination-Activity Limitations  Continence;Toileting;Other    Examination-Participation Restrictions  Community Activity    Stability/Clinical Decision Making  Evolving/Moderate complexity    Rehab Potential  Good    PT Frequency  1x / week    PT Duration  Other (comment)   10   PT Treatment/Interventions  Cryotherapy;Biofeedback;Moist Heat;Ultrasound;Therapeutic activities;Therapeutic exercise;Neuromuscular re-education;Manual techniques;Patient/family education;Scar mobilization;Dry needling    Consulted and Agree with Plan of Care  Patient       Patient will benefit from skilled therapeutic intervention in order to improve the following deficits and impairments:  Pain, Increased fascial restricitons, Decreased coordination, Decreased mobility, Increased muscle spasms, Decreased activity tolerance, Decreased endurance, Decreased strength, Decreased knowledge of use of DME, Postural dysfunction, Improper body mechanics, Difficulty walking  Visit Diagnosis: Muscle weakness (generalized)  Diastasis recti  Other muscle spasm  Leg length discrepancy     Problem  List Patient Active Problem List   Diagnosis Date Noted  . Postpartum depression 03/01/2019  . Complex partial seizure ([place]) 03/21/2018  . Encephalomalacia 03/21/2018  . Anxiety disorder 03/21/2018  . Epilepsy ([place]) 01/30/2017  . Abnormal MRI of head 10/27/2015  . History of bacterial meningitis in infancy 10/27/2015    Jerl Mina ,PT, DPT, E-RYT  07/17/2019, 9:25 AM  [place] MAIN [facility] SERVICES [place], [place], [address] Phone: [phone]   Fax:  [phone]  Name: Chelsea Collins MRN: [id number] Date of Birth: Jul 13, 1986

## 2019-07-17 NOTE — Patient Instructions (Addendum)
10/27 appt   Not crossing your legs to minimzie tightness of pelvic floor   Make time for the routine above 3-5 x week  1 x day    Mon Tue Wed Thurs Fri  Sat Sun  Deep core level 1  X 1 day  Resumed blue berry contractions after period ends                     Deep core level 2 x  1 day  6 min                    zig zags  (strengthen hip abductors)  3 min x 1 day ______  OR   Step up with L , then R, Lower with R, then L , one on rail  2 min with leg                          STRETCHES                   Calf stretch by wall         Calf stretch in lunge         Hip Flexor stretch with onje foot on a chair, rocking          Figure-4 stretch on back/ seated , ankle crossing opp thigh          Happy baby                  2 x day    Mon Tue Wed Thurs Fri  Sat Sun    minisquats  10reps x 2 day                  Finger tip on wall Single leg balance, 30 sec, Both leg  Gaze straight ahead                    Heel raises with one hand on wall lower slowly to still have weight on ballmounds  20 reps  X 2 x day                        [employer] -single leg by doorway             ______

## 2019-07-19 ENCOUNTER — Encounter: Payer: No Typology Code available for payment source | Admitting: Physical Therapy

## 2019-07-24 ENCOUNTER — Ambulatory Visit
Payer: No Typology Code available for payment source | Attending: Obstetrics and Gynecology | Admitting: Physical Therapy

## 2019-07-24 ENCOUNTER — Encounter: Payer: Self-pay | Admitting: Physical Therapy

## 2019-07-24 ENCOUNTER — Other Ambulatory Visit: Payer: Self-pay

## 2019-07-24 DIAGNOSIS — M6208 Separation of muscle (nontraumatic), other site: Secondary | ICD-10-CM | POA: Diagnosis present

## 2019-07-24 DIAGNOSIS — M62838 Other muscle spasm: Secondary | ICD-10-CM | POA: Diagnosis present

## 2019-07-24 DIAGNOSIS — M6281 Muscle weakness (generalized): Secondary | ICD-10-CM | POA: Diagnosis present

## 2019-07-24 DIAGNOSIS — M2170 Unequal limb length (acquired), unspecified site: Secondary | ICD-10-CM | POA: Diagnosis present

## 2019-07-25 NOTE — Therapy (Addendum)
[place] MAIN [facility] SERVICES [address] [place], [place], [address] Phone: [phone]   Fax:  [phone]  Physical Therapy Treatment  Patient Details  Name: Chelsea Collins MRN: [id number] Date of Birth: [birth year] Referring Provider (PT): Billey Chang    Encounter Date: 07/24/2019  PT End of Session - 07/25/19 1143    Visit Number  30    Date for PT Re-Evaluation  09/18/19    PT Start Time  0811    PT Stop Time  0900    PT Time Calculation (min)  49 min    Activity Tolerance  Patient tolerated treatment well    Behavior During Therapy  [facility] for tasks assessed/performed       Past Medical History:  Diagnosis Date  . Anxiety disorder   . Encephalomalacia 03/21/2018   Occipital bilateral  . GAD (generalized anxiety disorder) 03/21/2018  . Heat intolerance   . Hypoglycemia   . Meningitis   . Postpartum depression 03/01/2019  . Seizures ([place])    as an infant    Past Surgical History:  Procedure Laterality Date  . EYE SURGERY    . TYMPANOSTOMY TUBE PLACEMENT      There were no vitals filed for this visit.  Subjective Assessment - 07/24/19 0829    Subjective  Pt practiced her exercises 2x last week. Pt played Twister but needed to sit down after her 2nd turn because her quad muscles were burning but it subsided after 15 min. Pt thinks it is because they are weak. Pt expressed stress at work the past weeks. Pt feels tired. Pt wants to be able to perform normal activities as a way to remain strong. Pt feels she has had other things to do besides the HEP.    Pertinent History  Sometimes she feels she second guesses she did not do the exercise right but she knows she did. Pt feels frustrated that she failed. This is different from being in school where she can practice sonething and get it right but there is limit with the doing the exercises because there is apoint she notices when she overworking the muscles by doing the exercises too  perfect.    Patient Stated Goals  Working and walking with stroller and baby without hip, pelvic, thigh,  glut tightness and have intercourse without pain         [facility] PT Assessment - 07/25/19 1157      Observation/Other Assessments   Observations  forward head, thoracic kyphosis . Not cues for crossing legs. equal weight bearing with B LE                    [facility] Adult PT Treatment/Exercise - 07/25/19 1156      Therapeutic Activites    Other Therapeutic Activities  active listening, colloaborated strategies for more compliance to HEP       Neuro Re-ed    Neuro Re-ed Details   cued for cervical retraction to correct forward head, and less thoracic kyphosis       Manual Therapy   Manual therapy comments  L semispinal mm at upper T segments with STM/MWM                   PT Long Term Goals - 07/10/19 0809      PT LONG TERM GOAL #1   Title  independent with HEP and understand how to progress herself    Time  10  Period  Weeks    Status  Achieved      PT LONG TERM GOAL #2   Title  Pt will decrease her [place]  from 52 % to <25 % in order to improve QOL ( 10/20: 30%)    Time  10    Period  Weeks    Status  Partially Met      PT LONG TERM GOAL #3   Title  Pt will demo proper pelvic floor lengthening, deep core coordination in order to decrease urinary frequency, pelvic pain, and improve bowel movements    Time  6    Period  Weeks    Status  Achieved      PT LONG TERM GOAL #4   Title  Pt will demo equal alignment of pelvis and equal alignment iliac crest ( no anterior Lrotation) with compliance wearing shoe lift in R shoe   in order to be 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    Time  2    Period  Weeks    Status  Achieved      PT LONG TERM GOAL #5   Title  Pt will report no fecal / urine leakage across 1 month in order to participate in community activities and work duties    Baseline  10/20 reassessment :  2 x month    Time  8     Period  Weeks    Status  Partially Met      Additional Long Term Goals   Additional Long Term Goals  Yes      PT LONG TERM GOAL #6   Title  Pt will demo proper body mechanics ( anterior tilt of pelvis in seating, baby lifting/ carrying, walking with trunk rotation, transfer floor<>stand) in order to improve mobility of lumbopelvis, increase pelvic stability to perform functional activities without straining pelvic area    Time  6    Period  Weeks    Status  Achieved      PT LONG TERM GOAL #7   Title  Pt will demo decreased abdominal separation from 3 fingers width along linea alba and 1 knuckle depth to < 1 finger width and decreased depth in order to increase intraabdominal pressure for multiple pelvic functions.    Time  4    Period  Weeks    Status  Achieved      PT LONG TERM GOAL #8   Title  Pt will decrease her NIH-CPSI score from 65% to < 30% in order to improve pelvic floor function and QOL ( 10/20 : 32% )    Time  7    Period  Weeks    Status  Achieved      PT LONG TERM GOAL  #9   TITLE  Pt will regain sensation with dull/ sharp at medial / inferior cluneal S1,2,3 on L pelvic floor in order to regain sensation for optimal pelvic floor function    Time  4    Period  Weeks    Status  Achieved      PT LONG TERM GOAL  #10   TITLE  Pt will demo proper alignment of feet/ knee/ pelvis and no LOB with lunge position while performing resistance band exercises in order to progress with global mm strengthening without overactivity of pelvic floor mm/ pelvic malalignment 2/2 scoliosis/ leg length difference.    Time  4    Period  Weeks    Status  Achieved      PT LONG TERM GOAL  #11   TITLE  PSFS for L hip pain 0pt, perineal/vaginal sensation with sex 0pts, urinary leakage 0 pts to increase to > 5 pts in order to improve QOL (9/29: L hip pain 0--> 9, Sensation- 0--> 4, incontinence 0 --> 5 ( drizzling occasionally  and not noticing compared to tablespoon)    Baseline  10/20:  8/10  for all three categories    Time  8    Period  Weeks    Status  Partially Met      PT LONG TERM GOAL  #12   TITLE  Pt will demo increased SLS balance at 9 sec on R, 10 sec  on L to > 20 sec without hand on wall in order to play ball wihr dtr    Time  10    Period  Weeks    Status  New    Target Date  09/18/19            Plan - 07/24/19 0815    Clinical Impression Statement Pt required biopsychosocial approaches with active listening and collaboration with pt to create strategies for more compliance to HEP. Explained to pt the importance of HEP which is targeting specific areas of weakness and over time, pt will be able to perform more activities with less fatigue and burning sensations as she had noticed when playing [place] game for a short amount of time.  Pt reports her burning pain in her quads after playing the game but it subsided after 15 min. Pt demonstrates better understanding of her body and able to distinguish mm soreness/ fatigue versus pain.  Pt had no complaints about her L hip pain across the past 2 weeks.   Pt expressed her stressors with work and trying to find another career path.  She is planning to speak to her counselor this week.   Focused on minimizing L thoracic mm tightness which is likely related to her leg length difference which has remained corrected with shoe lift for several months. Following manual Tx, pt demo'd decreased thoracic kyphosis, increased ability to achieve anterior pelvic tilt, and no more forward head posture. At next sesssion,  plan to add more propioception training for cervical/ thoracic spine to maintain this upright posture.   Another factor to consider re: her fatigue is the fact that she recovered from [place] a few months ago and more research shows fatigue as a lingering impact on COVID survivors. Plan to educate pt with information at next session.    Pt continues to benefit from skilled PT.     Personal Factors and Comorbidities   Past/Current Experience;Time since onset of injury/illness/exacerbation    Examination-Activity Limitations  Continence;Toileting;Other    Examination-Participation Restrictions  Community Activity    Stability/Clinical Decision Making  Evolving/Moderate complexity    Rehab Potential  Good    PT Frequency  1x / week    PT Duration  Other (comment)   10   PT Treatment/Interventions  Cryotherapy;Biofeedback;Moist Heat;Ultrasound;Therapeutic activities;Therapeutic exercise;Neuromuscular re-education;Manual techniques;Patient/family education;Scar mobilization;Dry needling    Consulted and Agree with Plan of Care  Patient       Patient will benefit from skilled therapeutic intervention in order to improve the following deficits and impairments:  Pain, Increased fascial restricitons, Decreased coordination, Decreased mobility, Increased muscle spasms, Decreased activity tolerance, Decreased endurance, Decreased strength, Decreased knowledge of use of DME, Postural dysfunction, Improper body mechanics, Difficulty walking  Visit Diagnosis: Other  muscle spasm  Diastasis recti  Leg length discrepancy     Problem List Patient Active Problem List   Diagnosis Date Noted  . Postpartum depression 03/01/2019  . Complex partial seizure ([place]) 03/21/2018  . Encephalomalacia 03/21/2018  . Anxiety disorder 03/21/2018  . Epilepsy ([place]) 01/30/2017  . Abnormal MRI of head 10/27/2015  . History of bacterial meningitis in infancy 10/27/2015    Jerl Mina  ,PT, DPT, E-RYT  07/25/2019, 11:59 AM  [place] MAIN [facility] SERVICES [address] [place], [place], [address] Phone: [phone]   Fax:  [phone]  Name: Chelsea Collins MRN: [id number] Date of Birth: 1986-05-01

## 2019-07-26 ENCOUNTER — Ambulatory Visit (INDEPENDENT_AMBULATORY_CARE_PROVIDER_SITE_OTHER): Payer: No Typology Code available for payment source | Admitting: Psychology

## 2019-07-26 DIAGNOSIS — F4321 Adjustment disorder with depressed mood: Secondary | ICD-10-CM

## 2019-07-26 DIAGNOSIS — F411 Generalized anxiety disorder: Secondary | ICD-10-CM | POA: Diagnosis not present

## 2019-07-31 ENCOUNTER — Ambulatory Visit: Payer: No Typology Code available for payment source | Admitting: Physical Therapy

## 2019-07-31 ENCOUNTER — Other Ambulatory Visit: Payer: Self-pay

## 2019-07-31 DIAGNOSIS — M6281 Muscle weakness (generalized): Secondary | ICD-10-CM

## 2019-07-31 DIAGNOSIS — M62838 Other muscle spasm: Secondary | ICD-10-CM | POA: Diagnosis not present

## 2019-07-31 DIAGNOSIS — M6208 Separation of muscle (nontraumatic), other site: Secondary | ICD-10-CM

## 2019-07-31 DIAGNOSIS — M2170 Unequal limb length (acquired), unspecified site: Secondary | ICD-10-CM

## 2019-07-31 NOTE — Patient Instructions (Addendum)
    Transition from standing to floor :  stand to floor transfer :      _ slow     _ mini squat      _ crawl down with one hand on thigh      _downward dog  - >  shoulders down and back-  walk the dog ( knee bents to lengthe hamstrings)      Floor to stand :   downward dog   crawl hands back, butt is back, knees behind toes -> squat  Hands at waist , elbows back, chest lifts    ___           Stretching  R  hug around on the  R edge of the bed  Pull up and back with elbow while holding edge of bed Move elbow back 10-15 deg  To make space back there   ___   Open book L side lying

## 2019-08-01 NOTE — Therapy (Signed)
[place] MAIN [facility] SERVICES [address] [place], [place], [address] Phone: [phone]   Fax:  [phone]  Physical Therapy Treatment / Progress Note  Patient Details  Name: Chelsea Collins MRN: [id number] Date of Birth: 08-03-1986 Referring Provider (PT): Billey Chang    Encounter Date: 07/31/2019  PT End of Session - 08/01/19 2119    Visit Number  31    Date for PT Re-Evaluation  09/18/19    PT Start Time  0810    PT Stop Time  0905    PT Time Calculation (min)  55 min    Activity Tolerance  Patient tolerated treatment well    Behavior During Therapy  [facility] for tasks assessed/performed       Past Medical History:  Diagnosis Date  . Anxiety disorder   . Encephalomalacia 03/21/2018   Occipital bilateral  . GAD (generalized anxiety disorder) 03/21/2018  . Heat intolerance   . Hypoglycemia   . Meningitis   . Postpartum depression 03/01/2019  . Seizures ([place])    as an infant    Past Surgical History:  Procedure Laterality Date  . EYE SURGERY    . TYMPANOSTOMY TUBE PLACEMENT      There were no vitals filed for this visit.  Subjective Assessment - 08/01/19 2118    Subjective  Pt reported she did the exercises 2x a week. Her fatigue is better but it is not gone. She is tired to some extent on days when she is not working. Pt feels she has brain fog and forgetting what she says and forgetting things alot more. Pt had COVID at the end of July. Pt has not any L hip pain across the past week and only one episode of leakage with urge incontinence. Sensation during intercourse is also better. Pt felt a soreness  the size of a dime at the R low back briefly last week .    Pertinent History  Sometimes she feels she second guesses she did not do the exercise right but she knows she did. Pt feels frustrated that she failed. This is different from being in school where she can practice sonething and get it right but there is limit with the doing the  exercises because there is apoint she notices when she overworking the muscles by doing the exercises too perfect.    Patient Stated Goals  Working and walking with stroller and baby without hip, pelvic, thigh,  glut tightness and have intercourse without pain         [facility] PT Assessment - 08/01/19 2121      AROM   Overall AROM Comments  limited R trunk rotation  ( increased post Tx)      Palpation   Spinal mobility  R convex thoracic curve, L lumbar convex curve     Palpation comment  increased tightness along interspinals, posterior intercostals R                    [facility] Adult PT Treatment/Exercise - 08/01/19 2118      Neuro Re-ed    Neuro Re-ed Details   Cued for technique for L shoulder stretches 2/2 scoliosis      Modalities   Modalities  Moist Heat      Moist Heat Therapy   Moist Heat Location  --   during relaxation training ( placed neck and throacic)     Manual Therapy   Manual therapy comments  STM/MWM interspinals, posterior intercostals  R                  PT Long Term Goals - 08/01/19 2130      PT LONG TERM GOAL #1   Title  independent with HEP and understand how to progress herself    Time  10    Period  Weeks    Status  Achieved      PT LONG TERM GOAL #2   Title  Pt will decrease her [place]  from 52 % to <25 % in order to improve QOL ( 10/20: 30%)    Time  10    Period  Weeks    Status  Partially Met      PT LONG TERM GOAL #3   Title  Pt will demo proper pelvic floor lengthening, deep core coordination in order to decrease urinary frequency, pelvic pain, and improve bowel movements    Time  6    Period  Weeks    Status  Achieved      PT LONG TERM GOAL #4   Title  Pt will demo equal alignment of pelvis and equal alignment iliac crest ( no anterior Lrotation) with compliance wearing shoe lift in R shoe   in order to be 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    Time  2    Period  Weeks    Status  Achieved       PT LONG TERM GOAL #5   Title  Pt will report no fecal / urine leakage across 1 month in order to participate in community activities and work duties    Baseline  10/20 reassessment :  2 x month    Time  8    Period  Weeks    Status  Achieved      PT LONG TERM GOAL #6   Title  Pt will demo proper body mechanics ( anterior tilt of pelvis in seating, baby lifting/ carrying, walking with trunk rotation, transfer floor<>stand) in order to improve mobility of lumbopelvis, increase pelvic stability to perform functional activities without straining pelvic area    Time  6    Period  Weeks    Status  Achieved      PT LONG TERM GOAL #7   Title  Pt will demo decreased abdominal separation from 3 fingers width along linea alba and 1 knuckle depth to < 1 finger width and decreased depth in order to increase intraabdominal pressure for multiple pelvic functions.    Time  4    Period  Weeks    Status  Achieved      PT LONG TERM GOAL #8   Title  Pt will decrease her NIH-CPSI score from 65% to < 30% in order to improve pelvic floor function and QOL ( 10/20 : 32% )    Time  7    Period  Weeks    Status  Achieved      PT LONG TERM GOAL  #9   TITLE  Pt will regain sensation with dull/ sharp at medial / inferior cluneal S1,2,3 on L pelvic floor in order to regain sensation for optimal pelvic floor function    Time  4    Period  Weeks    Status  Achieved      PT LONG TERM GOAL  #10   TITLE  Pt will demo proper alignment of feet/ knee/ pelvis and no LOB with lunge position while performing resistance  band exercises in order to progress with global mm strengthening without overactivity of pelvic floor mm/ pelvic malalignment 2/2 scoliosis/ leg length difference.    Time  4    Period  Weeks    Status  Achieved      PT LONG TERM GOAL  #11   TITLE  PSFS for L hip pain 0pt, perineal/vaginal sensation with sex 0pts, urinary leakage 0 pts to increase to > 5 pts in order to improve QOL (9/29: L hip pain 0-->  9, Sensation- 0--> 4, incontinence 0 --> 5 ( drizzling occasionally  and not noticing compared to tablespoon)    Baseline  10/20:  8/10 for all three categories    Time  8    Period  Weeks    Status  Partially Met      PT LONG TERM GOAL  #12   TITLE  Pt will demo increased SLS balance at 9 sec on R, 10 sec  on L to > 20 sec without hand on wall in order to play ball wihr dtr    Time  10    Period  Weeks    Status  On-going            Plan - 08/01/19 2121    Clinical Impression Statement Pt has achieved 9/12 and progressing well towards remaining goals.   Pt has reached a milestone this week as she reports she has not any L hip pain across the past week. She had only one episode of leakage with urge incontinence and has regained sensation during intercourse.  Today, continued to focus on R thoracic/ L lumbar convex curve of her scoliosis. Provided R sided specific stretches and plan to progress with scoliosis-specific strengthening HEP at next session to minimize relapse of her Sx. Lower kinetic chain and hips continue to show increased strength and alignment.   Also plan to provide fatigue management interventions as pt has had fatigue related to COVID which she had back in July.   Pt is entering maintanence phase to her POC and continues to benefit from skilled PT to achieve remaining goals.       Personal Factors and Comorbidities  Past/Current Experience;Time since onset of injury/illness/exacerbation    Examination-Activity Limitations  Continence;Toileting;Other    Examination-Participation Restrictions  Community Activity    Stability/Clinical Decision Making  Evolving/Moderate complexity    Rehab Potential  Good    PT Frequency  1x / week    PT Duration  Other (comment)   10   PT Treatment/Interventions  Cryotherapy;Biofeedback;Moist Heat;Ultrasound;Therapeutic activities;Therapeutic exercise;Neuromuscular re-education;Manual techniques;Patient/family education;Scar  mobilization;Dry needling    Consulted and Agree with Plan of Care  Patient       Patient will benefit from skilled therapeutic intervention in order to improve the following deficits and impairments:  Pain, Increased fascial restricitons, Decreased coordination, Decreased mobility, Increased muscle spasms, Decreased activity tolerance, Decreased endurance, Decreased strength, Decreased knowledge of use of DME, Postural dysfunction, Improper body mechanics, Difficulty walking  Visit Diagnosis: Other muscle spasm  Diastasis recti  Leg length discrepancy  Muscle weakness (generalized)     Problem List Patient Active Problem List   Diagnosis Date Noted  . Postpartum depression 03/01/2019  . Complex partial seizure ([place]) 03/21/2018  . Encephalomalacia 03/21/2018  . Anxiety disorder 03/21/2018  . Epilepsy ([place]) 01/30/2017  . Abnormal MRI of head 10/27/2015  . History of bacterial meningitis in infancy 10/27/2015    Jerl Mina ,PT, DPT, E-RYT  08/01/2019, 9:32 PM  [place] MAIN [facility] SERVICES [address] [place], [place], [address] Phone: [phone]   Fax:  [phone]  Name: Chelsea Collins MRN: [id number] Date of Birth: [birth year]

## 2019-08-07 ENCOUNTER — Ambulatory Visit: Payer: No Typology Code available for payment source | Admitting: Physical Therapy

## 2019-08-13 ENCOUNTER — Encounter: Payer: Self-pay | Admitting: Neurology

## 2019-08-13 ENCOUNTER — Telehealth (INDEPENDENT_AMBULATORY_CARE_PROVIDER_SITE_OTHER): Payer: No Typology Code available for payment source | Admitting: Neurology

## 2019-08-13 ENCOUNTER — Other Ambulatory Visit: Payer: Self-pay

## 2019-08-13 VITALS — Ht 60.75 in | Wt 100.0 lb

## 2019-08-13 DIAGNOSIS — G40309 Generalized idiopathic epilepsy and epileptic syndromes, not intractable, without status epilepticus: Secondary | ICD-10-CM | POA: Diagnosis not present

## 2019-08-13 MED ORDER — OXCARBAZEPINE 300 MG PO TABS: 300.0000 mg | ORAL_TABLET | Freq: Two times a day (BID) | ORAL | 3 refills | Status: DC

## 2019-08-13 NOTE — Progress Notes (Signed)
Virtual Visit via Video Note The purpose of this virtual visit is to provide medical care while limiting exposure to the novel coronavirus.    Consent was obtained for video visit:  Yes.   Answered questions that patient had about telehealth interaction:  Yes.   I discussed the limitations, risks, security and privacy concerns of performing an evaluation and management service by telemedicine. I also discussed with the patient that there may be a patient responsible charge related to this service. The patient expressed understanding and agreed to proceed.  Pt location: Home Physician Location: office Name of referring provider:  [name], MD I connected with Jolyne Loa at patients initiation/request on 08/13/2019 at  3:30 PM EST by video enabled telemedicine application and verified that I am speaking with the correct person using two identifiers. Pt MRN:  [id number] Pt DOB:  [birth year] Video Participants:  Jolyne Loa   History of Present Illness:  The patient was seen as a virtual video visit on 08/13/2019. She was last seen in the neurology clinic 8 months ago for seizures. Her 7 month old girl [name] is present today with her. Since her last visit, she continues to do well, seizure-free since May 2018. She is back to pre-pregnancy oxcarbazepine dose of 300mg  BID without side effects. She denies any staring/unresponsive episodes, gaps in time, focal numbness/tingling/weakness, no falls. She denies any headaches or dizziness. She reports things are going well. She is on mirtazapine for depression and buspirone for anxiety. She is finishing PT after she had post-partum pelvic floor damage/hip misalignment. They are looking into another pregnancy in another 2-2.5 years from now. She is looking to going back to school online to do rehabilitation counseling.   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 She was diagnosed with epilepsy in childhood when she was having staring spells. She reports that seizures stopped at age 37 and she was taken off seizure medication at age 65. She denies any myoclonic jerks or convulsions. In 2012, she started having episodes where her left leg would become weak for a minute or so, sometimes it felt like her left arm was also affected. She had an MRI brain in [place] reported as showing previous injury to the occipital lobe. She saw a neurologist at that time with concern for MS, she was reassured there was no evidence of MS. In 2014, she was in a classroom and had an episode where she apparently walked toward the wall and stood there just staring and unresponsive. She was amnestic of the event until her supervisor called her to the office and told her what happened. She recalls another episode that same semester, she was stressed and waiting in the lobby, then did not know how she got there. She had an MRI brain without contrast which did not show any acute changes, there was note of ulceration in the calcarine cortex of both hemispheres, left greater than right, increased FLAIR signal going into the deep while matter with decreased signal centrally. Wake and drowsy EEG was normal. When she left school, she reports episodes were not as bad, she was still having difficulties with her left side and started having troubles with anxiety as well. She would be at a store and would not remember a word or a color. She had an incident in December 2016 while working at January 2017 in  [place], she was setting up then started feeling sick and nauseated, walked to the back of the store and felt like she could not stand up, vision was blurred. She had to sit on the floor and was pale. When she moved to [place], she started having episodes of feeling hot, nauseated, weird, pale, as well as zoning out ("walking into traffic"). She saw neurologist  Dr. Terrace ArabiaYan and had a repeat EEG which was abnormal, during hyperventilation which was performed twice, there were intermittent short bursts of generalized spike sharp wave, indicating a generalized epilepsy disorder. She was started on Lamotrigine but 5 weeks later had a severe allergic reaction with swollen lymph nodes, night sweats, then rash. She was switched to Keppra and had a more severe immediate reaction within 1-2 days with rash and facial swelling. She was then switched to Trileptal which she tolerated well, and reports that all the symptoms she was having went away. She last saw Dr. Terrace ArabiaYan in February to discuss Trileptal and contraceptives, and made the decision to switch to Vimpat. She presents today asking to be switched back to Trileptal, stating that the Vimpat was not controlling her symptoms. She would have episodes of turning Anthis, nausea, vision change, staring off into space unresponsive. She was also having headaches with vomiting. Her psychiatrist was concerned that Remeron was also contributing to symptoms, but she had been taking Remeron with Trileptal without these symptoms.   She also reports episodes where she would have a sudden speech impediment lasting 30 seconds, where she knows what she wants to say but cannot get the words out or form the words. Twice in the past 2 years she has felt a sensation of fear prior to the speech difficulties. She denies any olfactory/gustatory hallucinations, the left-sided symptoms have not recurred since 2014, no myoclonic jerks. She has had headaches every other day when she is late to take her Vimpat dose, resolving after she takes the medication. Pain is around her eyes with some photosensitivity, no nausea/vomiting. She denies any diplopia, dizziness, neck/back pain, bowel/bladder dysfunction.  Epilepsy Risk Factors:  Bacterial meningitis in infancy with encephalomalacia in the bilateral occipital lobes. Her maternal cousin had seizures in  childhood, a maternal uncle had 2 seizures felt to be related to glucose levels. Otherwise she had a normal birth and early development.  There is no history of febrile convulsions, significant traumatic brain injury, neurosurgical procedures.  Prior AEDs: Keppra, Lamotrigine, Trileptal    Outpatient Encounter Medications as of 08/13/2019  Medication Sig  . busPIRone (BUSPAR) 10 MG tablet Take 40 mg by mouth 2 (two) times daily.  . mirtazapine (REMERON) 15 MG tablet Take 15 mg by mouth at bedtime.  .    . Oxcarbazepine (TRILEPTAL) 300 MG tablet Take 1 tablet (300 mg total) by mouth 2 (two) times daily.  .    .    .    .    .    .     No facility-administered encounter medications on file as of 08/13/2019.     Observations/Objective:   Vitals:   08/13/19 1508  Weight: 100 lb (45.4 kg)  Height: 5' 0.75" (1.543 m)   GEN:  The patient appears stated age and is in NAD.  Neurological examination:Patient is awake, alert, oriented x 3. No aphasia or dysarthria. Intact fluency and comprehension. Remote and recent memory intact. Able to name and repeat. Cranial nerves: Extraocular movements intact. No facial asymmetry. Motor: moves all extremities symmetrically, at least anti-gravity x  4.  Assessment and Plan:   This is a pleasant 34 yo RH woman with a history of bacterial meningitis in infancy with bilateral occipital encephalomalacia, seizures in childhood with staring spells that initially stopped at age 44 but recurred in 2014. She also started having recurrent episodes of left-sided weakness in 2014, denies any further similar symptoms since then. She reports recurrent episodes where she becomes pale, nauseated, and has had staring spells that stopped after Trileptal, but recurred with switch to Vimpat. EEG showed generalized discharges during hyperventilation suggestive of a generalized epilepsy. She remains seizure-free since May 2018, continue oxcarbazepine 300mg  BID. She has been  headache-free. She does not drive. She will follow-up in 8 months and knows to call for any changes.    Follow Up Instructions:   -I discussed the assessment and treatment plan with the patient. The patient was provided an opportunity to ask questions and all were answered. The patient agreed with the plan and demonstrated an understanding of the instructions.   The patient was advised to call back or seek an in-person evaluation if the symptoms worsen or if the condition fails to improve as anticipated.    Cameron Sprang, MD

## 2019-08-14 ENCOUNTER — Ambulatory Visit (INDEPENDENT_AMBULATORY_CARE_PROVIDER_SITE_OTHER): Payer: No Typology Code available for payment source | Admitting: Psychology

## 2019-08-14 DIAGNOSIS — F4321 Adjustment disorder with depressed mood: Secondary | ICD-10-CM

## 2019-08-14 DIAGNOSIS — F411 Generalized anxiety disorder: Secondary | ICD-10-CM

## 2019-08-21 ENCOUNTER — Other Ambulatory Visit: Payer: Self-pay

## 2019-08-21 ENCOUNTER — Ambulatory Visit
Payer: No Typology Code available for payment source | Attending: Obstetrics and Gynecology | Admitting: Physical Therapy

## 2019-08-21 DIAGNOSIS — M6208 Separation of muscle (nontraumatic), other site: Secondary | ICD-10-CM | POA: Diagnosis not present

## 2019-08-21 DIAGNOSIS — M6281 Muscle weakness (generalized): Secondary | ICD-10-CM | POA: Insufficient documentation

## 2019-08-21 DIAGNOSIS — M62838 Other muscle spasm: Secondary | ICD-10-CM | POA: Diagnosis present

## 2019-08-21 DIAGNOSIS — M2170 Unequal limb length (acquired), unspecified site: Secondary | ICD-10-CM | POA: Insufficient documentation

## 2019-08-21 NOTE — Patient Instructions (Signed)
Not crossing your legs to minimzie tightness of pelvic floor   Make time for the routine above 3-5 x week  1 x day   Mon Tue Wed Thurs Fri  Sat Sun  Deep core level 1 X 1 day  Resumed blue berry contractions after period ends            Deep core level 2 x 1 day  6 min           zig zags (strengthen hip abductors)  3 min x 1 day ______ OR  Step up with L , then R, Lower with R, then L , one on rail  2 min with leg              STRETCHES           Calf stretch by wall         Calf stretch in lunge         Hip Flexor stretch with onje foot on a chair, rocking          Figure-4 stretch on back/ seated , ankle crossing opp thigh          Happy baby                 2 x day   Mon Tue Wed Thurs Fri  Sat Sun   minisquats  10reps x 2 day          Finger tip on wall Single leg balance, 30 sec, Both leg  Gaze straight ahead           Heel raises with one hand on walllower slowly to still have weighton ballmounds  20 reps X 2 x day             [employer] -single leg by doorway             ______

## 2019-08-21 NOTE — Therapy (Signed)
[place] MAIN [facility] SERVICES [address] [place], [place], [address] Phone: [phone]   Fax:  [phone]  Physical Therapy Treatment  Patient Details  Name: Chelsea Collins MRN: [id number] Date of Birth: Dec 04, 1985 Referring Provider (PT): Billey Chang    Encounter Date: 08/21/2019  PT End of Session - 08/21/19 0826    Visit Number  [id number]   progress note done at 30th visit   Date for PT Re-Evaluation  09/18/19    PT Start Time  0815    PT Stop Time  0910    PT Time Calculation (min)  55 min    Activity Tolerance  Patient tolerated treatment well    Behavior During Therapy  [facility] for tasks assessed/performed       Past Medical History:  Diagnosis Date  . Anxiety disorder   . Encephalomalacia 03/21/2018   Occipital bilateral  . GAD (generalized anxiety disorder) 03/21/2018  . Heat intolerance   . Hypoglycemia   . Meningitis   . Postpartum depression 03/01/2019  . Seizures ([place])    as an infant    Past Surgical History:  Procedure Laterality Date  . EYE SURGERY    . TYMPANOSTOMY TUBE PLACEMENT      There were no vitals filed for this visit.  Subjective Assessment - 08/21/19 0828    Subjective  Pt reported she did not do her exercises as much the past weeks. Pt did do the side stepping and squats with her baby. Pt lifted something heavy at work and realized her legs wer not as strong as they were before. After lifting, pt noticed something in her L hip but it went away quickly.    Pertinent History  Sometimes she feels she second guesses she did not do the exercise right but she knows she did. Pt feels frustrated that she failed. 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    Patient Stated Goals  Working and walking with stroller and baby  without hip, pelvic, thigh,  glut tightness and have intercourse without pain         [facility] PT Assessment - 08/21/19 1717      Coordination   Gross Motor Movements are Fluid and Coordinated  --   no perturbation at trunk with posterior sling test     Single Leg Stance   Comments  no hand on the wall 30 sec R SLS, with hand on wall 30 sec L SLS                   [facility] Adult PT Treatment/Exercise - 08/21/19 1717      Ambulation/Gait   Gait Comments  no L genu valgus , no toe in      Therapeutic Activites    Other Therapeutic Activities  reassessment , discussed POC decreasing to every other week , reviewed HEP, discussed compliance to HEP       Neuro Re-ed    Neuro Re-ed Details   minor cues for HEP                   PT Long Term Goals - 08/21/19 0841      PT LONG TERM GOAL #1   Title  independent with HEP and understand how to progress herself  Time  10    Period  Weeks    Status  Achieved      PT LONG TERM GOAL #2   Title  Pt will decrease her E. 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    Time  2    Period  Weeks    Status  Achieved      PT LONG TERM GOAL #5   Title  Pt will report no fecal / urine leakage across 1 month in order to participate in community activities and work duties    Baseline  10/20 reassessment :  2 x month    Time  8    Period  Weeks    Status  Achieved      PT  LONG TERM GOAL #6   Title  Pt will demo proper body mechanics ( anterior tilt of pelvis in seating, baby lifting/ carrying, walking with trunk rotation, transfer floor<>stand) in order to improve mobility of lumbopelvis, increase pelvic stability to perform functional activities without straining pelvic area    Time  6    Period  Weeks    Status  Achieved      PT LONG TERM GOAL #7   Title  Pt will demo decreased abdominal separation from 3 fingers width along linea alba and 1 knuckle depth to < 1 finger width and decreased depth in order to increase intraabdominal pressure for multiple pelvic functions.    Time  4    Period  Weeks    Status  Achieved      PT LONG TERM GOAL #8   Title  Pt will decrease her NIH-CPSI score from 65% to < 30% in order to improve pelvic floor function and QOL ( 10/20 : 32% )    Time  7    Period  Weeks    Status  Achieved      PT LONG TERM GOAL  #9   TITLE  Pt will regain sensation with dull/ sharp at medial / inferior cluneal S1,2,3 on L pelvic floor in order to regain sensation for optimal pelvic floor function    Time  4    Period  Weeks    Status  Achieved      PT LONG TERM GOAL  #10   TITLE  Pt will demo proper alignment of feet/ knee/ pelvis and no LOB with lunge position while performing resistance band exercises in order to progress with global mm strengthening without overactivity of pelvic floor mm/ pelvic malalignment 2/2 scoliosis/ leg length difference.    Time  4    Period  Weeks    Status  Achieved      PT LONG  TERM GOAL  #11   TITLE  PSFS for L hip pain 0pt, perineal/vaginal sensation with sex 0pts, urinary leakage 0 pts to increase to > 5 pts in order to improve QOL (9/29: L hip pain 0--> 9, Sensation- 0--> 4, incontinence 0 --> 5 ( drizzling occasionally  and not noticing compared to tablespoon)    Baseline  10/20:  8/10 for all three categories     12/1:  L hip pain and urinary leakage  10pts,  perineal/vaginal sensation 9 pts    Time  8     Period  Weeks    Status  Achieved      PT LONG TERM GOAL  #12   TITLE  Pt will demo increased SLS balance at 9 sec on R, 10 sec  on L to > 20 sec without hand on wall in order to play ball with dtr    Baseline  --    Time  10    Period  Weeks    Status  Partially Met            Plan - 08/21/19 1151    Clinical Impression Statement  Pt is progressing well by demonstrating no more genu valgus nor toe in on L LE in gait, increased mm bulk of gastroc and glut mm. Trunk stability has improved along with thoracolumbar/ glut strength. Pt currently need to improve static SLS balance. Pt is progressing well and sessions are decreasing to every other week. Provided motivational strategies to maintain compliance. Provided self mobilization technique for dorsal nerve of pudendal for sensory activation.  Anticipate pt will achieve goals with upcoming sessions with skilled PT    Personal Factors and Comorbidities  Past/Current Experience;Time since onset of injury/illness/exacerbation    Examination-Activity Limitations  Continence;Toileting;Other    Examination-Participation Restrictions  Community Activity    Stability/Clinical Decision Making  Evolving/Moderate complexity    Rehab Potential  Good    PT Frequency  Biweekly    PT Duration  Other (comment)   10   PT Treatment/Interventions  Cryotherapy;Biofeedback;Moist Heat;Ultrasound;Therapeutic activities;Therapeutic exercise;Neuromuscular re-education;Manual techniques;Patient/family education;Scar mobilization;Dry needling    Consulted and Agree with Plan of Care  Patient       Patient will benefit from skilled therapeutic intervention in order to improve the following deficits and impairments:  Pain, Increased fascial restricitons, Decreased coordination, Decreased mobility, Increased muscle spasms, Decreased activity tolerance, Decreased endurance, Decreased strength, Decreased knowledge of use of DME, Postural dysfunction, Improper body  mechanics, Difficulty walking  Visit Diagnosis: Diastasis recti  Other muscle spasm  Leg length discrepancy  Muscle weakness (generalized)     Problem List Patient Active Problem List   Diagnosis Date Noted  . Postpartum depression 03/01/2019  . Complex partial seizure ([place]) 03/21/2018  . Encephalomalacia 03/21/2018  . Anxiety disorder 03/21/2018  . Epilepsy ([place]) 01/30/2017  . Abnormal MRI of head 10/27/2015  . History of bacterial meningitis in infancy 10/27/2015    Jerl Mina  ,PT, DPT, E-RYT  08/21/2019, 6:05 PM  [place] MAIN [facility] SERVICES [address] [place], [place], [address] Phone: [phone]   Fax:  [phone]  Name: Chelsea Collins MRN: [id number] Date of Birth: Feb 05, 1986

## 2019-08-28 ENCOUNTER — Encounter: Payer: No Typology Code available for payment source | Admitting: Physical Therapy

## 2019-09-04 ENCOUNTER — Other Ambulatory Visit: Payer: Self-pay

## 2019-09-04 ENCOUNTER — Ambulatory Visit: Payer: No Typology Code available for payment source | Admitting: Physical Therapy

## 2019-09-04 DIAGNOSIS — M6208 Separation of muscle (nontraumatic), other site: Secondary | ICD-10-CM | POA: Diagnosis not present

## 2019-09-04 DIAGNOSIS — M2170 Unequal limb length (acquired), unspecified site: Secondary | ICD-10-CM

## 2019-09-04 DIAGNOSIS — M62838 Other muscle spasm: Secondary | ICD-10-CM

## 2019-09-04 DIAGNOSIS — M6281 Muscle weakness (generalized): Secondary | ICD-10-CM

## 2019-09-04 NOTE — Therapy (Signed)
[place] MAIN [facility] SERVICES [address] [place], [place], [address] Phone: [phone]   Fax:  [phone]  Physical Therapy Treatment  Patient Details  Name: Chelsea Collins MRN: [id number] Date of Birth: 27-Dec-1985 Referring Provider (PT): Billey Chang    Encounter Date: 09/04/2019  PT End of Session - 09/04/19 0817    Visit Number  33    Date for PT Re-Evaluation  09/18/19    PT Start Time  0808    Activity Tolerance  Patient tolerated treatment well    Behavior During Therapy  [facility] for tasks assessed/performed       Past Medical History:  Diagnosis Date  . Anxiety disorder   . Encephalomalacia 03/21/2018   Occipital bilateral  . GAD (generalized anxiety disorder) 03/21/2018  . Heat intolerance   . Hypoglycemia   . Meningitis   . Postpartum depression 03/01/2019  . Seizures ([place])    as an infant    Past Surgical History:  Procedure Laterality Date  . EYE SURGERY    . TYMPANOSTOMY TUBE PLACEMENT      There were no vitals filed for this visit.  Subjective Assessment - 09/04/19 0811    Subjective  Pt has gained some weight as the side effect of her depression medication which pt feels good about gaining weight.  Pt feels the sensation around her clitoris comes and goes. Pt noticed her hips were tight after riding in the car for a long time and holding baby for the light show.  The tightness went away by the next day. Walking at work helped the next time.    Pertinent History  Sometimes she feels she second guesses she did not do the exercise right but she knows she did. Pt feels frustrated that she failed. 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    Patient Stated Goals  Working and walking with stroller and baby without hip, pelvic,  thigh,  glut tightness and have intercourse without pain         [facility] PT Assessment - 09/04/19 0957      Observation/Other Assessments   Observations  upright posture, no throacic kyphosis    Heel raises still require hands on wall.  Without hand on walll, only 3 reps but MMT 4/5 better ankle stability                Pelvic Floor Special Questions - 09/04/19 0954    Pelvic Floor Internal Exam  consented verbally,  denied infections     Exam Type  Vaginal    Sensation  decreased on R inside labia minora > L pre Tx,, equal post Tx     Palpation  tightness at R bulbospongiosus/ urethra compressae         [facility] Adult PT Treatment/Exercise - 09/04/19 0956      Therapeutic Activites    Other Therapeutic Activities  discussed and reassessed goals . Practiced proper co-activation of thoracolumbar system with simulated games carrying baby in arms/ lifting      Manual Therapy   Manual therapy comments  external releases: R bulbospongiosus/ urethra compressae  ( MWM, sustained pressure)                   PT Long Term Goals - 09/04/19  0816      PT LONG TERM GOAL #1   Title  independent with HEP and understand how to progress herself    Time  10    Period  Weeks    Status  Achieved      PT LONG TERM GOAL #2   Title  Pt will decrease her [place]  from 52 % to <25 % in order to improve QOL ( 10/20: 30% , 12/1: 0% )    Time  10    Period  Weeks    Status  Achieved      PT LONG TERM GOAL #3   Title  Pt will demo proper pelvic floor lengthening, deep core coordination in order to decrease urinary frequency, pelvic pain, and improve bowel movements    Time  6    Period  Weeks    Status  Achieved      PT LONG TERM GOAL #4   Title  Pt will demo equal alignment of pelvis and equal alignment iliac crest ( no anterior Lrotation) with compliance wearing shoe lift in R shoe   in order to be 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    Time  2    Period   Weeks    Status  Achieved      PT LONG TERM GOAL #5   Title  Pt will report no fecal / urine leakage across 1 month in order to participate in community activities and work duties    Baseline  10/20 reassessment :  2 x month    Time  8    Period  Weeks    Status  Achieved      PT LONG TERM GOAL #6   Title  Pt will demo proper body mechanics ( anterior tilt of pelvis in seating, baby lifting/ carrying, walking with trunk rotation, transfer floor<>stand) in order to improve mobility of lumbopelvis, increase pelvic stability to perform functional activities without straining pelvic area    Time  6    Period  Weeks    Status  Achieved      PT LONG TERM GOAL #7   Title  Pt will demo decreased abdominal separation from 3 fingers width along linea alba and 1 knuckle depth to < 1 finger width and decreased depth in order to increase intraabdominal pressure for multiple pelvic functions.    Time  4    Period  Weeks    Status  Achieved      PT LONG TERM GOAL #8   Title  Pt will decrease her NIH-CPSI score from 65% to < 30% in order to improve pelvic floor function and QOL ( 10/20 : 32% )    Time  7    Period  Weeks    Status  Achieved      PT LONG TERM GOAL  #9   TITLE  Pt will regain sensation with dull/ sharp at medial / inferior cluneal S1,2,3 on L pelvic floor in order to regain sensation for optimal pelvic floor function    Time  4    Period  Weeks    Status  Achieved      PT LONG TERM GOAL  #10   TITLE  Pt will demo proper alignment of feet/ knee/ pelvis and no LOB with lunge position while performing resistance band exercises in order to progress with global mm strengthening without overactivity of pelvic floor mm/ pelvic malalignment 2/2 scoliosis/ leg  length difference.    Time  4    Period  Weeks    Status  Achieved      PT LONG TERM GOAL  #11   TITLE  PSFS for L hip pain 0pt, perineal/vaginal sensation with sex 0pts, urinary leakage 0 pts to increase to > 5 pts in order to  improve QOL (9/29: L hip pain 0--> 9, Sensation- 0--> 4, incontinence 0 --> 5 ( drizzling occasionally  and not noticing compared to tablespoon)    Baseline  10/20:  8/10 for all three categories     12/1:  L hip pain and urinary leakage  10pts,  perineal/vaginal sensation 9 pts    Time  8    Period  Weeks    Status  Achieved      PT LONG TERM GOAL  #12   TITLE  Pt will demo increased SLS balance at 9 sec on R, 10 sec  on L to > 20 sec without hand on wall in order to play ball with dtr    Time  10    Period  Weeks    Status  Partially Met            Plan - 09/04/19 1131    Clinical Impression Statement Pt continues to show maintenance of good posture. Without hand on wall, pt achieved only 3 reps but MMT 4/5showed  better ankle stability. Pt reported better compliance with PT HEP across decreased freuqnecy of PT sessions to every other week.   Focused on pt's report of decreased sensation around clitoris with external releases at R anterior mm 1-2nd layers. Pt regained sensation on R at labia minora region post Tx. Suspect R sided mm tightness and shorter R leg is likely contributing factor to decreased sensation of nerve to clitoris. Anticipate stretches given today and if ptt remains compliant with shoe lift wear and lower kinetic chain HEP, this issue can improve.     Pt continues to benefit from skilled PT and possibly ready for d/c at next session in 2 weeks.          Personal Factors and Comorbidities  Past/Current Experience;Time since onset of injury/illness/exacerbation    Examination-Activity Limitations  Continence;Toileting;Other    Examination-Participation Restrictions  Community Activity    Stability/Clinical Decision Making  Evolving/Moderate complexity    Rehab Potential  Good    PT Frequency  Biweekly    PT Duration  Other (comment)   10   PT Treatment/Interventions  Cryotherapy;Biofeedback;Moist Heat;Ultrasound;Therapeutic activities;Therapeutic  exercise;Neuromuscular re-education;Manual techniques;Patient/family education;Scar mobilization;Dry needling    Consulted and Agree with Plan of Care  Patient       Patient will benefit from skilled therapeutic intervention in order to improve the following deficits and impairments:  Pain, Increased fascial restricitons, Decreased coordination, Decreased mobility, Increased muscle spasms, Decreased activity tolerance, Decreased endurance, Decreased strength, Decreased knowledge of use of DME, Postural dysfunction, Improper body mechanics, Difficulty walking  Visit Diagnosis: Diastasis recti  Other muscle spasm  Leg length discrepancy  Muscle weakness (generalized)     Problem List Patient Active Problem List   Diagnosis Date Noted  . Postpartum depression 03/01/2019  . Complex partial seizure ([place]) 03/21/2018  . Encephalomalacia 03/21/2018  . Anxiety disorder 03/21/2018  . Epilepsy ([place]) 01/30/2017  . Abnormal MRI of head 10/27/2015  . History of bacterial meningitis in infancy 10/27/2015    Jerl Mina ,PT, DPT, E-RYT  09/04/2019, 11:32 AM  [place] MAIN  [place], [place], [address] Phone: [phone]   Fax:  [phone]  Name: Chelsea Collins MRN: [id number] Date of Birth: August 21, 1986

## 2019-09-04 NOTE — Patient Instructions (Signed)
To minimize tightness at the 2nd layer of the top triangle of pelvic floor muscles   self mobilization at the bottom of R pubic bone Pelvic tilts -small  10 reps  R knee out and in -10 reps    ___  Leg propped on chair, in te half "v" and rocking over that knee  10 reps  Both sides   ____ Keep up with other exercises

## 2019-09-10 ENCOUNTER — Ambulatory Visit (INDEPENDENT_AMBULATORY_CARE_PROVIDER_SITE_OTHER): Payer: No Typology Code available for payment source | Admitting: Psychology

## 2019-09-10 DIAGNOSIS — F411 Generalized anxiety disorder: Secondary | ICD-10-CM | POA: Diagnosis not present

## 2019-09-10 DIAGNOSIS — F4321 Adjustment disorder with depressed mood: Secondary | ICD-10-CM

## 2019-09-11 ENCOUNTER — Encounter: Payer: No Typology Code available for payment source | Admitting: Physical Therapy

## 2019-09-17 ENCOUNTER — Telehealth: Payer: Self-pay | Admitting: Neurology

## 2019-09-17 NOTE — Telephone Encounter (Signed)
Suggest she take this with her hx?

## 2019-09-17 NOTE — Telephone Encounter (Signed)
Patient advised.

## 2019-09-17 NOTE — Telephone Encounter (Signed)
Current recommendations for seizure patients and majority of other neurological disorders is to get the vaccine, because getting Covid can cause worse problems. Thanks

## 2019-09-17 NOTE — Telephone Encounter (Signed)
Patient is in line for the Covid-19 vaccine at work. She is not sure whether she should get the vaccine because of her history of seizures and neurological disorders. She'd like some advice on how best to proceed.

## 2019-09-18 ENCOUNTER — Ambulatory Visit: Payer: No Typology Code available for payment source | Admitting: Physical Therapy

## 2019-09-18 ENCOUNTER — Other Ambulatory Visit: Payer: Self-pay

## 2019-09-18 DIAGNOSIS — M62838 Other muscle spasm: Secondary | ICD-10-CM

## 2019-09-18 DIAGNOSIS — M6281 Muscle weakness (generalized): Secondary | ICD-10-CM

## 2019-09-18 DIAGNOSIS — M2170 Unequal limb length (acquired), unspecified site: Secondary | ICD-10-CM

## 2019-09-18 DIAGNOSIS — M6208 Separation of muscle (nontraumatic), other site: Secondary | ICD-10-CM | POA: Diagnosis not present

## 2019-09-18 NOTE — Therapy (Signed)
[place] MAIN [facility] SERVICES [address] [place], [place], [address] Phone: [phone]   Fax:  [phone]  Physical Therapy Treatment  / Discharge Summary  ( reporting 03/02/19 to 09/18/19  across 34 visits)    Patient Details  Name: Chelsea Collins MRN: [id number] Date of Birth: [birth year] Referring Provider (PT): Billey Chang    Encounter Date: 09/18/2019  PT End of Session - 09/18/19 0903    Visit Number  34    Date for PT Re-Evaluation  09/18/19    PT Start Time  0810    PT Stop Time  0908    PT Time Calculation (min)  58 min    Activity Tolerance  Patient tolerated treatment well    Behavior During Therapy  [facility] for tasks assessed/performed       Past Medical History:  Diagnosis Date  . Anxiety disorder   . Encephalomalacia 03/21/2018   Occipital bilateral  . GAD (generalized anxiety disorder) 03/21/2018  . Heat intolerance   . Hypoglycemia   . Meningitis   . Postpartum depression 03/01/2019  . Seizures ([place])    as an infant    Past Surgical History:  Procedure Laterality Date  . EYE SURGERY    . TYMPANOSTOMY TUBE PLACEMENT      There were no vitals filed for this visit.  Subjective Assessment - 09/18/19 0809    Subjective  Pt has been walking alot and she feels some knee discomfort "like it needs to be stretched".  Pt had not stretched the last few days due to the holidays.   Pt has no issue with perineal sensation anymore and did not feel the need to do the self-mobilzing technique. Pt has been sleeping better and not on her couch anymore. Pt's fatigue level is better.    Pertinent History  Sometimes she feels she second guesses she did not do the exercise right but she knows she did. Pt feels frustrated that she failed. This is different from being in school where she can practice sonething and get it right but there is limit with the doing the exercises because there is apoint she notices when she overworking the muscles by  doing the exercises too perfect.    Patient Stated Goals  Working and walking with stroller and baby without hip, pelvic, thigh,  glut tightness and have intercourse without pain         [facility] PT Assessment - 09/18/19 0854      Single Leg Stance   Comments  with UE support:  60  sec , B SLS,     without UE support:  6 sec, R SLS, 8 sec L SLS                 [facility] Adult PT Treatment/Exercise - 09/18/19 0912      Therapeutic Activites    Other Therapeutic Activities  reassessed goals, strength , discussed importance of HEP compliance after discharge       Exercises   Exercises  --   stretches for after walking                  PT Long Term Goals - 09/18/19 0903      PT LONG TERM GOAL #1   Title  independent with HEP and understand how to progress herself    Time  10    Period  Weeks    Status  Achieved      PT LONG TERM  GOAL #2   Title  Pt will decrease her [place]  from 52 % to <25 % in order to improve QOL ( 10/20: 30% , 12/1: 0% )    Time  10    Period  Weeks    Status  Achieved      PT LONG TERM GOAL #3   Title  Pt will demo proper pelvic floor lengthening, deep core coordination in order to decrease urinary frequency, pelvic pain, and improve bowel movements    Time  6    Period  Weeks    Status  Achieved      PT LONG TERM GOAL #4   Title  Pt will demo equal alignment of pelvis and equal alignment iliac crest ( no anterior Lrotation) with compliance wearing shoe lift in R shoe   in order to be 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    Time  2    Period  Weeks    Status  Achieved      PT LONG TERM GOAL #5   Title  Pt will report no fecal / urine leakage across 1 month in order to participate in community activities and work duties    Baseline  10/20 reassessment :  2 x month    Time  8    Period  Weeks    Status  Achieved      PT LONG TERM GOAL #6   Title  Pt will demo proper body mechanics ( anterior tilt of pelvis in  seating, baby lifting/ carrying, walking with trunk rotation, transfer floor<>stand) in order to improve mobility of lumbopelvis, increase pelvic stability to perform functional activities without straining pelvic area    Time  6    Period  Weeks    Status  Achieved      PT LONG TERM GOAL #7   Title  Pt will demo decreased abdominal separation from 3 fingers width along linea alba and 1 knuckle depth to < 1 finger width and decreased depth in order to increase intraabdominal pressure for multiple pelvic functions.    Time  4    Period  Weeks    Status  Achieved      PT LONG TERM GOAL #8   Title  Pt will decrease her NIH-CPSI score from 65% to < 30% in order to improve pelvic floor function and QOL ( 10/20 : 32% )    Time  7    Period  Weeks    Status  Achieved      PT LONG TERM GOAL  #9   TITLE  Pt will regain sensation with dull/ sharp at medial / inferior cluneal S1,2,3 on L pelvic floor in order to regain sensation for optimal pelvic floor function    Time  4    Period  Weeks    Status  Achieved      PT LONG TERM GOAL  #10   TITLE  Pt will demo proper alignment of feet/ knee/ pelvis and no LOB with lunge position while performing resistance band exercises in order to progress with global mm strengthening without overactivity of pelvic floor mm/ pelvic malalignment 2/2 scoliosis/ leg length difference.    Time  4    Period  Weeks    Status  Achieved      PT LONG TERM GOAL  #11   TITLE  PSFS for L hip pain 0pt, perineal/vaginal sensation with sex 0pts, urinary leakage 0 pts  to increase to > 5 pts in order to improve QOL (9/29: L hip pain 0--> 9, Sensation- 0--> 4, incontinence 0 --> 5 ( drizzling occasionally  and not noticing compared to tablespoon)    Baseline  10/20:  8/10 for all three categories     12/1:  L hip pain and urinary leakage  10pts,  perineal/vaginal sensation 9 pts    Time  8    Period  Weeks    Status  Achieved      PT LONG TERM GOAL  #12   TITLE  Pt will demo  increased SLS balance at 9 sec on R, 10 sec  on L to > 20 sec without hand on wall in order to play ball with dtr    Baseline  6 sec R SLS, 8 sec L SLS without figner on wall.  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 Pt also has regain perineal sensation and has no more urinary leakage.  Pt met 11/12 goals and partially met remaining goal.   Pt reports her Sx of L hip pain, perineal sensation, and urinary leakage since SOC  have improved " A Very Great Deal Better" base on the GROC scale.   Pt's scoliosis and leg length difference were addressed first with shoe lift in combination with manual Tx, neuromuscular re-education, biopsychosocial approaches. Musculoskeletal improvements that rendered pt's return to ADLs include:  _perineal scar restrictions decreased _diastasis recti, SIJ mobility, pelvic alignment restored along with increased deep core coordination and strength  _pelvic floor mobility and strength increased _increased hip, BLE , ankle strength _gait deviations ( genu valgus, toe in, limited trunk rotation) have all improved along with increased gait speed and endurance at work _spinal thoracic scoliotic curves with associated mm imbalance was addressed with manual Tx and customized stretches.   Pt contracted COVID during this POC and experienced fatigue and was explained about post-COVID symptoms to monitor. Advised pt to regain better sleep hygiene to sleep on her bed and avoid falling asleep on her couch. Pt has remained compliant. Today reports her fatigue levels have improved. Pt experienced work stress and examined taking on a different career path during this POC and has been working with  her counselor. Pt presented with brighter affect and demeanor over the past sessions.   Pt was educated on the importance of compliance to HEP after d/c and pt voiced understanding.  Pt is ready for d/c at this time.     Personal Factors and Comorbidities  Past/Current Experience;Time since onset of injury/illness/exacerbation    Examination-Activity Limitations  Continence;Toileting;Other    Examination-Participation Restrictions  Community Activity    Stability/Clinical Decision Making  Evolving/Moderate complexity    Rehab Potential  Good    PT Frequency  Biweekly    PT Duration  Other (comment)   10   PT Treatment/Interventions  Cryotherapy;Biofeedback;Moist Heat;Ultrasound;Therapeutic activities;Therapeutic exercise;Neuromuscular re-education;Manual techniques;Patient/family education;Scar mobilization;Dry needling    Consulted and Agree with Plan of Care  Patient       Patient will benefit from skilled therapeutic intervention in order to improve the following  deficits and impairments:  Pain, Increased fascial restricitons, Decreased coordination, Decreased mobility, Increased muscle spasms, Decreased activity tolerance, Decreased endurance, Decreased strength, Decreased knowledge of use of DME, Postural dysfunction, Improper body mechanics, Difficulty walking  Visit Diagnosis: Diastasis recti  Other muscle spasm  Leg length discrepancy  Muscle weakness (generalized)     Problem List Patient Active Problem List   Diagnosis Date Noted  . Postpartum depression 03/01/2019  . Complex partial seizure ([place]) 03/21/2018  . Encephalomalacia 03/21/2018  . Anxiety disorder 03/21/2018  . Epilepsy ([place]) 01/30/2017  . Abnormal MRI of head 10/27/2015  . History of bacterial meningitis in infancy 10/27/2015    Jerl Mina ,PT, DPT, E-RYT  09/18/2019, 9:15 AM  [place] MAIN [facility] SERVICES [address] [place], [place],  [address] Phone: [phone]   Fax:  [phone]  Name: Chelsea Collins MRN: [id number] Date of Birth: 1986/06/13

## 2019-09-18 NOTE — Patient Instructions (Signed)
Walking stretches:   Mini squat-figure 4 (piriformis)  hip flexor stretch on step hip flexor twist on step  Calf stretch   _____ Today filmed on your phone  Stretches : (Cuing provided for proper alignment)  Stretches for your legs: LAYING on Back Use upper arms and elbows for stability when pulling strap Opposite knee bent and foot firm in align with hip   Strap on [place]: _ ( lubricating hip socket)  strap, L knee bent, R [place] against strap and spread toes, rolling foot 15 deg out and in across midline.  10 reps each side    _ ( hamstring)    knee bends10 reps  With knee pointing straight     knee pointing out towards armpit  10 reps    _ ( IT band)     Let the knee flop to the side, try to push into a wall ( see your recording)

## 2019-10-08 ENCOUNTER — Ambulatory Visit (INDEPENDENT_AMBULATORY_CARE_PROVIDER_SITE_OTHER): Payer: No Typology Code available for payment source | Admitting: Psychology

## 2019-10-08 DIAGNOSIS — F4321 Adjustment disorder with depressed mood: Secondary | ICD-10-CM | POA: Diagnosis not present

## 2019-10-08 DIAGNOSIS — F411 Generalized anxiety disorder: Secondary | ICD-10-CM

## 2019-11-05 ENCOUNTER — Ambulatory Visit (INDEPENDENT_AMBULATORY_CARE_PROVIDER_SITE_OTHER): Payer: No Typology Code available for payment source | Admitting: Psychology

## 2019-11-05 DIAGNOSIS — F4321 Adjustment disorder with depressed mood: Secondary | ICD-10-CM

## 2019-11-05 DIAGNOSIS — F411 Generalized anxiety disorder: Secondary | ICD-10-CM | POA: Diagnosis not present

## 2019-11-22 ENCOUNTER — Other Ambulatory Visit: Payer: Self-pay

## 2019-11-23 ENCOUNTER — Ambulatory Visit (INDEPENDENT_AMBULATORY_CARE_PROVIDER_SITE_OTHER): Payer: No Typology Code available for payment source | Admitting: Family Medicine

## 2019-11-23 ENCOUNTER — Encounter: Payer: Self-pay | Admitting: Family Medicine

## 2019-11-23 VITALS — BP 98/66 | HR 65 | Temp 97.5°F | Ht 60.75 in | Wt 116.8 lb

## 2019-11-23 DIAGNOSIS — H9312 Tinnitus, left ear: Secondary | ICD-10-CM

## 2019-11-23 DIAGNOSIS — H6983 Other specified disorders of Eustachian tube, bilateral: Secondary | ICD-10-CM

## 2019-11-23 MED ORDER — FLUTICASONE PROPIONATE 50 MCG/ACT NA SUSP: 1.0000 | Freq: Every day | NASAL | 2 refills | Status: DC

## 2019-11-23 NOTE — Progress Notes (Signed)
Subjective  CC:  Chief Complaint  Patient presents with  . Ear clogged    left ear clogged started this Wednesday. ringing in ears comes and goes. no pain. harder to hear out of ear    HPI: Chelsea Collins is a 34 y.o. female who presents to the office today to address the problems listed above in the chief complaint.  As above, noted ringing and fullness. Used q tips without relief. No pain. No f/c/s. Has occ sneezing with pollen but no severe or consistent allergy sxs. Feels well. Thought hearing was worse on left.   Assessment  1. Tinnitus of left ear   2. ETD (Eustachian tube dysfunction), bilateral      Plan   ETD and tinnitus:  Reassured. Start flonase daily and monitor. Should improve over time. valsalva maneuver if needed. Avoid q tips.nl hearing screen today.   Follow up: Return in about 4 months (around 03/24/2020) for complete physical.  Visit date not found  No orders of the defined types were placed in this encounter.  Meds ordered this encounter  Medications  . fluticasone (FLONASE) 50 MCG/ACT nasal spray    Sig: Place 1 spray into both nostrils daily.    Dispense:  16 g    Refill:  2      I reviewed the patients updated PMH, FH, and SocHx.    Patient Active Problem List   Diagnosis Date Noted  . Postpartum depression 03/01/2019  . Complex partial seizure (HCC) 03/21/2018  . Encephalomalacia 03/21/2018  . Anxiety disorder 03/21/2018  . Epilepsy (HCC) 01/30/2017  . Abnormal MRI of head 10/27/2015  . History of bacterial meningitis in infancy 10/27/2015   Current Meds  Medication Sig  . busPIRone (BUSPAR) 10 MG tablet Take 40 mg by mouth 2 (two) times daily.  . mirtazapine (REMERON) 15 MG tablet Take 15 mg by mouth at bedtime.  . Oxcarbazepine (TRILEPTAL) 300 MG tablet Take 1 tablet (300 mg total) by mouth 2 (two) times daily.    Allergies: Patient is allergic to keppra [levetiracetam] and lamictal [lamotrigine]. Family History: Patient family  history includes Autism in her brother; Bipolar disorder in her brother; Cancer in her maternal grandmother; Depression in her mother; Diabetes in her maternal grandmother; Heart attack in her maternal grandfather; Heart disease in her maternal grandfather; Hypertension in her maternal grandfather; Intellectual disability in her brother; Miscarriages / [place] in her mother. Social History:  Patient  reports that she has never smoked. She has never used smokeless tobacco. She reports that she does not drink alcohol or use drugs.  Review of Systems: Constitutional: Negative for fever malaise or anorexia Cardiovascular: negative for chest pain Respiratory: negative for SOB or persistent cough Gastrointestinal: negative for abdominal pain  Objective  Vitals: BP 98/66 (BP Location: Right Arm, Patient Position: Sitting, Cuff Size: Normal)   Pulse 65   Temp (!) 97.5 F (36.4 C) (Temporal)   Ht 5' 0.75" (1.543 m)   Wt 116 lb 12.8 oz (53 kg)   LMP 11/10/2019 (Exact Date)   SpO2 98%   BMI 22.25 kg/m  General: no acute distress , A&Ox3 HEENT: PEERL, conjunctiva normal, left TM nl landmarks, small fluid. Mildly erythematous canal w/o ttp. Right tm nl.     Hearing Screening   Method: Audiometry   125Hz  250Hz  500Hz  1000Hz  2000Hz  3000Hz  4000Hz  6000Hz  8000Hz   Right ear:           Left ear:  Comments: Tone Audometry Right ear- passed Left ear- passed    Commons side effects, risks, benefits, and alternatives for medications and treatment plan prescribed today were discussed, and the patient expressed understanding of the given instructions. Patient is instructed to call or message via MyChart if he/she has any questions or concerns regarding our treatment plan. No barriers to understanding were identified. We discussed Red Flag symptoms and signs in detail. Patient expressed understanding regarding what to do in case of urgent or emergency type symptoms.   Medication list was  reconciled, printed and provided to the patient in AVS. Patient instructions and summary information was reviewed with the patient as documented in the AVS. This note was prepared with assistance of Dragon voice recognition software. Occasional wrong-word or sound-a-like substitutions may have occurred due to the inherent limitations of voice recognition software  This visit occurred during the SARS-CoV-2 public health emergency.  Safety protocols were in place, including screening questions prior to the visit, additional usage of staff PPE, and extensive cleaning of exam room while observing appropriate contact time as indicated for disinfecting solutions.

## 2019-11-23 NOTE — Patient Instructions (Addendum)
Please return in June 2021 for your annual complete physical; please come fasting.   If you have any questions or concerns, please don't hesitate to send me a message via MyChart or call the office at [phone]. Thank you for visiting with Chelsea Collins today! It's our pleasure caring for you.   Eustachian Tube Dysfunction  Eustachian tube dysfunction refers to a condition in which a blockage develops in the narrow passage that connects the middle ear to the back of the nose (eustachian tube). The eustachian tube regulates air pressure in the middle ear by letting air move between the ear and nose. It also helps to drain fluid from the middle ear space. Eustachian tube dysfunction can affect one or both ears. When the eustachian tube does not function properly, air pressure, fluid, or both can build up in the middle ear. What are the causes? This condition occurs when the eustachian tube becomes blocked or cannot open normally. Common causes of this condition include:  Ear infections.  Colds and other infections that affect the nose, mouth, and throat (upper respiratory tract).  Allergies.  Irritation from cigarette smoke.  Irritation from stomach acid coming up into the esophagus (gastroesophageal reflux). The esophagus is the tube that carries food from the mouth to the stomach.  Sudden changes in air pressure, such as from descending in an airplane or scuba diving.  Abnormal growths in the nose or throat, such as: ? Growths that line the nose (nasal polyps). ? Abnormal growth of cells (tumors). ? Enlarged tissue at the back of the throat (adenoids). What increases the risk? You are more likely to develop this condition if:  You smoke.  You are overweight.  You are a child who has: ? Certain birth defects of the mouth, such as cleft palate. ? Large tonsils or adenoids. What are the signs or symptoms? Common symptoms of this condition include:  A feeling of fullness in the  ear.  Ear pain.  Clicking or popping noises in the ear.  Ringing in the ear.  Hearing loss.  Loss of balance.  Dizziness. Symptoms may get worse when the air pressure around you changes, such as when you travel to an area of high elevation, fly on an airplane, or go scuba diving. How is this diagnosed? This condition may be diagnosed based on:  Your symptoms.  A physical exam of your ears, nose, and throat.  Tests, such as those that measure: ? The movement of your eardrum (tympanogram). ? Your hearing (audiometry). How is this treated? Treatment depends on the cause and severity of your condition.  In mild cases, you may relieve your symptoms by moving air into your ears. This is called "popping the ears."  In more severe cases, or if you have symptoms of fluid in your ears, treatment may include: ? Medicines to relieve congestion (decongestants). ? Medicines that treat allergies (antihistamines). ? Nasal sprays or ear drops that contain medicines that reduce swelling (steroids). ? A procedure to drain the fluid in your eardrum (myringotomy). In this procedure, a small tube is placed in the eardrum to:  Drain the fluid.  Restore the air in the middle ear space. ? A procedure to insert a balloon device through the nose to inflate the opening of the eustachian tube (balloon dilation). Follow these instructions at home: Lifestyle  Do not do any of the following until your health care provider approves: ? Travel to high altitudes. ? Fly in airplanes. ? Work in a Estate agent  or room. ? Scuba dive.  Do not use any products that contain nicotine or tobacco, such as cigarettes and e-cigarettes. If you need help quitting, ask your health care provider.  Keep your ears dry. Wear fitted earplugs during showering and bathing. Dry your ears completely after. General instructions  Take over-the-counter and prescription medicines only as told by your health care  provider.  Use techniques to help pop your ears as recommended by your health care provider. These may include: ? Chewing gum. ? Yawning. ? Frequent, forceful swallowing. ? Closing your mouth, holding your nose closed, and gently blowing as if you are trying to blow air out of your nose.  Keep all follow-up visits as told by your health care provider. This is important. Contact a health care provider if:  Your symptoms do not go away after treatment.  Your symptoms come back after treatment.  You are unable to pop your ears.  You have: ? A fever. ? Pain in your ear. ? Pain in your head or neck. ? Fluid draining from your ear.  Your hearing suddenly changes.  You become very dizzy.  You lose your balance. Summary  Eustachian tube dysfunction refers to a condition in which a blockage develops in the eustachian tube.  It can be caused by ear infections, allergies, inhaled irritants, or abnormal growths in the nose or throat.  Symptoms include ear pain, hearing loss, or ringing in the ears.  Mild cases are treated with maneuvers to unblock the ears, such as yawning or ear popping.  Severe cases are treated with medicines. Surgery may also be done (rare). This information is not intended to replace advice given to you by your health care provider. Make sure you discuss any questions you have with your health care provider. Document Revised: 12/27/2017 Document Reviewed: 12/27/2017 Elsevier Patient Education  [employer].

## 2019-12-03 ENCOUNTER — Ambulatory Visit (INDEPENDENT_AMBULATORY_CARE_PROVIDER_SITE_OTHER): Payer: No Typology Code available for payment source | Admitting: Psychology

## 2019-12-03 DIAGNOSIS — F4321 Adjustment disorder with depressed mood: Secondary | ICD-10-CM | POA: Diagnosis not present

## 2019-12-03 DIAGNOSIS — F411 Generalized anxiety disorder: Secondary | ICD-10-CM

## 2019-12-26 ENCOUNTER — Telehealth (INDEPENDENT_AMBULATORY_CARE_PROVIDER_SITE_OTHER): Payer: No Typology Code available for payment source | Admitting: Family Medicine

## 2019-12-26 ENCOUNTER — Other Ambulatory Visit: Payer: Self-pay

## 2019-12-26 DIAGNOSIS — H6983 Other specified disorders of Eustachian tube, bilateral: Secondary | ICD-10-CM | POA: Diagnosis not present

## 2019-12-26 DIAGNOSIS — H9312 Tinnitus, left ear: Secondary | ICD-10-CM

## 2019-12-26 NOTE — Progress Notes (Signed)
Virtual Visit via Video Note  Subjective  CC:  Chief Complaint  Patient presents with  . Eye Problem    had corrective eye surgery when younger, but eyes became cross eyed again. Wanting to know if another corrective eye surgery would better her field of vision. Wanting PCP input on this situation.Does not have opthamologist, needing a referral       I connected with [name] on 12/26/19 at 10:30 AM EDT by a video enabled telemedicine application and verified that I am speaking with the correct person using two identifiers. Location patient: Home Location provider: [facility] [address], Office Persons participating in the virtual visit: Chelsea Collins, Chelsea Ora, MD [place], [place]  I discussed the limitations of evaluation and management by telemedicine and the availability of in person appointments. The patient expressed understanding and agreed to proceed. HPI: Chelsea Collins is a 34 y.o. female who was contacted today to address the problems listed above in the chief complaint. . Pt reports h/o "crossed eyes" as a child and had corrective surgery. Now would like to know if vision could be improved with another surgery. Feels like eyes will "cross" again. Denies double vision or eye pain. Had optometry exam last year. Wears corrective lenses . ETD/tinnitus: of note, used flonase for a month or two and ringing in ear and fullness sensation has resolved.   Assessment  1. Tinnitus of left ear   2. ETD (Eustachian tube dysfunction), bilateral      Plan   Amblyopia secondary to strabismus, childhood:  Discussed former diagnosis. Currently, eye alignment appears fairly normal but exam limited due to virtual visit. Recommend ophtho appt for annual eye exam and go from there. Doubt she will need corrective surgery again.   Tinnitus due to effusion/etd: resolved with flonase. rec prn use if returns.  I discussed the assessment and treatment plan with the  patient. The patient was provided an opportunity to ask questions and all were answered. The patient agreed with the plan and demonstrated an understanding of the instructions.   The patient was advised to call back or seek an in-person evaluation if the symptoms worsen or if the condition fails to improve as anticipated. Follow up: cpe  03/26/2020  No orders of the defined types were placed in this encounter.     I reviewed the patients updated PMH, FH, and SocHx.    Patient Active Problem List   Diagnosis Date Noted  . Postpartum depression 03/01/2019  . Complex partial seizure (HCC) 03/21/2018  . Encephalomalacia 03/21/2018  . Anxiety disorder 03/21/2018  . Epilepsy (HCC) 01/30/2017  . Abnormal MRI of head 10/27/2015  . History of bacterial meningitis in infancy 10/27/2015   Current Meds  Medication Sig  . busPIRone (BUSPAR) 10 MG tablet Take 40 mg by mouth 2 (two) times daily.  . fluticasone (FLONASE) 50 MCG/ACT nasal spray Place 1 spray into both nostrils daily.  . mirtazapine (REMERON) 15 MG tablet Take 15 mg by mouth at bedtime.  . Oxcarbazepine (TRILEPTAL) 300 MG tablet Take 1 tablet (300 mg total) by mouth 2 (two) times daily.    Allergies: Patient is allergic to keppra [levetiracetam] and lamictal [lamotrigine]. Family History: Patient family history includes Autism in her brother; Bipolar disorder in her brother; Cancer in her maternal grandmother; Depression in her mother; Diabetes in her maternal grandmother; Heart attack in her maternal grandfather; Heart disease in her maternal grandfather; Hypertension in her maternal grandfather;  Intellectual disability in her brother; Miscarriages / [place] in her mother. Social History:  Patient  reports that she has never smoked. She has never used smokeless tobacco. She reports that she does not drink alcohol or use drugs.  Review of Systems: Constitutional: Negative for fever malaise or anorexia Cardiovascular: negative  for chest pain Respiratory: negative for SOB or persistent cough Gastrointestinal: negative for abdominal pain  OBJECTIVE Vitals: There were no vitals taken for this visit. General: no acute distress , A&Ox3  Leamon Arnt, MD

## 2019-12-31 ENCOUNTER — Ambulatory Visit: Payer: No Typology Code available for payment source | Admitting: Psychology

## 2020-03-26 ENCOUNTER — Encounter: Payer: No Typology Code available for payment source | Admitting: Family Medicine

## 2020-03-27 ENCOUNTER — Ambulatory Visit (INDEPENDENT_AMBULATORY_CARE_PROVIDER_SITE_OTHER): Payer: No Typology Code available for payment source | Admitting: Family Medicine

## 2020-03-27 ENCOUNTER — Encounter: Payer: Self-pay | Admitting: Family Medicine

## 2020-03-27 ENCOUNTER — Other Ambulatory Visit: Payer: Self-pay

## 2020-03-27 VITALS — BP 110/70 | HR 83 | Temp 97.9°F | Resp 18 | Ht 61.0 in | Wt 120.4 lb

## 2020-03-27 DIAGNOSIS — Z1159 Encounter for screening for other viral diseases: Secondary | ICD-10-CM | POA: Diagnosis not present

## 2020-03-27 DIAGNOSIS — Z0000 Encounter for general adult medical examination without abnormal findings: Secondary | ICD-10-CM

## 2020-03-27 DIAGNOSIS — H6983 Other specified disorders of Eustachian tube, bilateral: Secondary | ICD-10-CM

## 2020-03-27 DIAGNOSIS — F411 Generalized anxiety disorder: Secondary | ICD-10-CM

## 2020-03-27 DIAGNOSIS — H9312 Tinnitus, left ear: Secondary | ICD-10-CM

## 2020-03-27 DIAGNOSIS — G40909 Epilepsy, unspecified, not intractable, without status epilepticus: Secondary | ICD-10-CM

## 2020-03-27 LAB — CBC WITH DIFFERENTIAL/PLATELET
Basophils Absolute: 0 10*3/uL (ref 0.0–0.1)
Basophils Relative: 0.5 % (ref 0.0–3.0)
Eosinophils Absolute: 0 10*3/uL (ref 0.0–0.7)
Eosinophils Relative: 0.8 % (ref 0.0–5.0)
HCT: 38.5 % (ref 36.0–46.0)
Hemoglobin: 12.9 g/dL (ref 12.0–15.0)
Lymphocytes Relative: 42 % (ref 12.0–46.0)
Lymphs Abs: 2 10*3/uL (ref 0.7–4.0)
MCHC: 33.5 g/dL (ref 30.0–36.0)
MCV: 86.4 fl (ref 78.0–100.0)
Monocytes Absolute: 0.3 10*3/uL (ref 0.1–1.0)
Monocytes Relative: 7 % (ref 3.0–12.0)
Neutro Abs: 2.4 10*3/uL (ref 1.4–7.7)
Neutrophils Relative %: 49.7 % (ref 43.0–77.0)
Platelets: 259 10*3/uL (ref 150.0–400.0)
RBC: 4.46 Mil/uL (ref 3.87–5.11)
RDW: 13.6 % (ref 11.5–15.5)
WBC: 4.8 10*3/uL (ref 4.0–10.5)

## 2020-03-27 LAB — LIPID PANEL
Cholesterol: 188 mg/dL (ref 0–200)
HDL: 50.4 mg/dL (ref >39.00)
LDL Cholesterol: 123 mg/dL — ABNORMAL HIGH (ref 0–99)
NonHDL: 137.57
Total CHOL/HDL Ratio: 4
Triglycerides: 72 mg/dL (ref 0.0–149.0)
VLDL: 14.4 mg/dL (ref 0.0–40.0)

## 2020-03-27 LAB — COMPREHENSIVE METABOLIC PANEL
ALT: 8 U/L (ref 0–35)
AST: 12 U/L (ref 0–37)
Albumin: 4.4 g/dL (ref 3.5–5.2)
Alkaline Phosphatase: 46 U/L (ref 39–117)
BUN: 12 mg/dL (ref 6–23)
CO2: 26 mEq/L (ref 19–32)
Calcium: 9.1 mg/dL (ref 8.4–10.5)
Chloride: 104 mEq/L (ref 96–112)
Creatinine, Ser: 0.65 mg/dL (ref 0.40–1.20)
GFR: 105.05 mL/min (ref >60.00)
Glucose, Bld: 73 mg/dL (ref 70–99)
Potassium: 3.7 mEq/L (ref 3.5–5.1)
Sodium: 139 mEq/L (ref 135–145)
Total Bilirubin: 0.3 mg/dL (ref 0.2–1.2)
Total Protein: 6.9 g/dL (ref 6.0–8.3)

## 2020-03-27 MED ORDER — CETIRIZINE HCL 10 MG PO TABS: 10.0000 mg | ORAL_TABLET | Freq: Every day | ORAL | 11 refills | Status: DC

## 2020-03-27 NOTE — Progress Notes (Signed)
Subjective  Chief Complaint  Patient presents with  . Annual Exam  . Ear Pain    Left ear pain. She would like to discuss her pain.    HPI: Chelsea Collins is a 34 y.o. female who presents to [facility] today for a Female Wellness Visit.  She also has the concerns and/or needs as listed above in the chief complaint. These will be addressed in addition to the Health Maintenance Visit.   Wellness Visit: annual visit with health maintenance review and exam without Pap   HM: all up to date. Condoms for birth control. Reviewed nl pap and neg hpv from jan; OB is with [facility]. Reviewed female wellness exam 09/2019 Chronic disease management visit and/or acute problem visit:  GAD and seizures are controlled.  ETD/tinnitus left ear: has a mild uri now and sxs returned. When she is well, sxs are minimal. See old notes. On flonase daily.  Assessment    1. Annual physical exam   2. Need for hepatitis C screening test   3. Nonintractable epilepsy without status epilepticus, unspecified epilepsy type ([place])   4. GAD (generalized anxiety disorder)   5. ETD (Eustachian tube dysfunction), bilateral   6. Tinnitus of left ear      Plan  Female Wellness Visit:  Age appropriate Health Maintenance and Prevention measures were discussed with patient. Included topics are cancer screening recommendations, ways to keep healthy (see AVS) including dietary and exercise recommendations, regular eye and dental care, use of seat belts, and avoidance of moderate alcohol use and tobacco use.   BMI: discussed patient's BMI and encouraged positive lifestyle modifications to help get to or maintain a target BMI.  HM needs and immunizations were addressed and ordered. See below for orders. See HM and immunization section for updates.  Routine labs and screening tests ordered including cmp, cbc and lipids where appropriate.  Discussed recommendations regarding Vit D and calcium  supplementation (see AVS)  Chronic disease f/u and/or acute problem visit: (deemed necessary to be done in addition to the wellness visit):  ETD/tinnitus:  Add zyrtec to flonase.   Chronic problems are well controlled.   Follow up: 12 months for cpe   Orders Placed This Encounter  Procedures  . CBC with Differential/Platelet  . Comprehensive metabolic panel  . Lipid panel  . Hepatitis C antibody   Meds ordered this encounter  Medications  . cetirizine (ZYRTEC) 10 MG tablet    Sig: Take 1 tablet (10 mg total) by mouth daily.    Dispense:  30 tablet    Refill:  11      Lifestyle: Body mass index is 22.75 kg/m. Wt Readings from Last 3 Encounters:  03/27/20 120 lb 6.4 oz (54.6 kg)  11/23/19 116 lb 12.8 oz (53 kg)  08/13/19 100 lb (45.4 kg)     Patient Active Problem List   Diagnosis Date Noted  . Postpartum depression 03/01/2019    Started on wellbutrin. Sexual side effects with zoloft   . Complex partial seizure ([place]) 03/21/2018  . Encephalomalacia 03/21/2018    Occipital bilateral   . GAD (generalized anxiety disorder) 03/21/2018    DXd by Dr. Ysidro Evert, psychiatry: started tx with mirtazpine. Now pregnant on buspar   . Epilepsy ([place]) 01/30/2017  . Abnormal MRI of head 10/27/2015  . History of bacterial meningitis in infancy 10/27/2015   Health Maintenance  Topic Date Due  . Hepatitis C Screening  Never done  . INFLUENZA VACCINE  04/20/2020  . PAP SMEAR-Modifier  09/24/2024  . TETANUS/TDAP  06/28/2028  . COVID-19 Vaccine  Completed  . HIV Screening  Discontinued   Immunization History  Administered Date(s) Administered  . Influenza Inj Mdck Quad Pf 07/15/2019  . Influenza,inj,Quad PF,6+ Mos 05/31/2018  . MMR 09/27/2018  . Moderna SARS-COVID-2 Vaccination 09/28/2019, 10/26/2019  . Tdap 04/21/2015, 06/28/2018   We updated and reviewed the patient's past history in detail and it is documented below. Allergies: Patient  reports no history of alcohol  use. Past Medical History Patient  has a past medical history of Anxiety disorder, Encephalomalacia (03/21/2018), GAD (generalized anxiety disorder) (03/21/2018), Heat intolerance, Hypoglycemia, Meningitis, Postpartum depression (03/01/2019), and Seizures ([place]). Past Surgical History Patient  has a past surgical history that includes Eye surgery and Tympanostomy tube placement. Social History   Socioeconomic History  . Marital status: Married    Spouse name: Not on file  . Number of children: 0  . Years of education: 3  . Highest education level: Not on file  Occupational History    Comment: [employer]  Tobacco Use  . Smoking status: Never Smoker  . Smokeless tobacco: Never Used  Vaping Use  . Vaping Use: Never used  Substance and Sexual Activity  . Alcohol use: No    Alcohol/week: 0.0 standard drinks  . Drug use: No  . Sexual activity: Yes    Partners: Male    Birth control/protection: Condom  Other Topics Concern  . Not on file  Social History Narrative   Lives with two roommates.     Right-handed.   Two 12oz cans of soda daily.   Social Determinants of Health   Financial Resource Strain:   . Difficulty of Paying Living Expenses:   Food Insecurity:   . Worried About Charity fundraiser in the Last Year:   . Arboriculturist in the Last Year:   Transportation Needs:   . Film/video editor (Medical):   Marland Kitchen Lack of Transportation (Non-Medical):   Physical Activity:   . Days of Exercise per Week:   . Minutes of Exercise per Session:   Stress:   . Feeling of Stress :   Social Connections:   . Frequency of Communication with Friends and Family:   . Frequency of Social Gatherings with Friends and Family:   . Attends Religious Services:   . Active Member of Clubs or Organizations:   . Attends Archivist Meetings:   Marland Kitchen Marital Status:    Family History  Problem Relation Age of Onset  . Depression Mother   . Miscarriages / [place] Mother   . Hypertension  Maternal Grandfather   . Heart disease Maternal Grandfather   . Heart attack Maternal Grandfather   . Intellectual disability Brother   . Bipolar disorder Brother   . Autism Brother   . Diabetes Maternal Grandmother   . Cancer Maternal Grandmother     Review of Systems: Constitutional: negative for fever or malaise Ophthalmic: negative for photophobia, double vision or loss of vision Cardiovascular: negative for chest pain, dyspnea on exertion, or new LE swelling Respiratory: negative for SOB or persistent cough Gastrointestinal: negative for abdominal pain, change in bowel habits or melena Genitourinary: negative for dysuria or gross hematuria, no abnormal uterine bleeding or disharge Musculoskeletal: negative for new gait disturbance or muscular weakness Integumentary: negative for new or persistent rashes, no breast lumps Neurological: negative for TIA or stroke symptoms Psychiatric: negative for SI or delusions Allergic/Immunologic: negative for hives  Patient Care Team    Relationship Specialty Notifications Start End  Leamon Arnt, MD PCP - General Family Medicine  09/25/18   Paticia Stack, PA-C Physician Assistant Physician Assistant  10/27/15   Paula Compton, MD Consulting Physician Obstetrics and Gynecology  03/21/18   Cameron Sprang, MD Consulting Physician Neurology  03/21/18   Benjaman Kindler, MD Consulting Physician Obstetrics and Gynecology  03/27/20     Objective  Vitals: BP 110/70   Pulse 83   Temp 97.9 F (36.6 C) (Temporal)   Resp 18   Ht _0  (1.549 m)   Wt 120 lb 6.4 oz (54.6 kg)   SpO2 97%   BMI 22.75 kg/m  General:  Well developed, well nourished, no acute distress  Psych:  Alert and orientedx3,normal mood and affect HEENT:  Normocephalic, atraumatic, non-icteric sclera, PERRL, supple neck without adenopathy, mass or thyromegaly, TMs nl Cardiovascular:  Normal S1, S2, RRR without gallop, rub or murmur Respiratory:  Good breath sounds bilaterally,  CTAB with normal respiratory effort Gastrointestinal: normal bowel sounds, soft, non-tender, no noted masses. No HSM MSK: no deformities, contusions. Joints are without erythema or swelling.  Skin:  Warm, no rashes or suspicious lesions noted Neurologic:    Mental status is normal. Gross motor and sensory exams are normal. Normal gait. No tremor     Commons side effects, risks, benefits, and alternatives for medications and treatment plan prescribed today were discussed, and the patient expressed understanding of the given instructions. Patient is instructed to call or message via MyChart if he/she has any questions or concerns regarding our treatment plan. No barriers to understanding were identified. We discussed Red Flag symptoms and signs in detail. Patient expressed understanding regarding what to do in case of urgent or emergency type symptoms.   Medication list was reconciled, printed and provided to the patient in AVS. Patient instructions and summary information was reviewed with the patient as documented in the AVS. This note was prepared with assistance of Dragon voice recognition software. Occasional wrong-word or sound-a-like substitutions may have occurred due to the inherent limitations of voice recognition software  This visit occurred during the SARS-CoV-2 public health emergency.  Safety protocols were in place, including screening questions prior to the visit, additional usage of staff PPE, and extensive cleaning of exam room while observing appropriate contact time as indicated for disinfecting solutions.

## 2020-03-27 NOTE — Patient Instructions (Addendum)
Please return in 12 months for your annual complete physical; please come fasting.  I will release your lab results to you on your MyChart account with further instructions. Please reply with any questions.   Add zyrtec nightly to your flonase for your ear symptoms. This should help calm it down again.   If you have any questions or concerns, please don't hesitate to send me a message via MyChart or call the office at [phone]. Thank you for visiting with us today! It's our pleasure caring for you.   Preventive Care 21-39 Years Old, Female Preventive care refers to visits with your health care provider and lifestyle choices that can promote health and wellness. This includes:  A yearly physical exam. This may also be called an annual well check.  Regular dental visits and eye exams.  Immunizations.  Screening for certain conditions.  Healthy lifestyle choices, such as eating a healthy diet, getting regular exercise, not using drugs or products that contain nicotine and tobacco, and limiting alcohol use. What can I expect for my preventive care visit? Physical exam Your health care provider will check your:  Height and weight. This may be used to calculate body mass index (BMI), which tells if you are at a healthy weight.  Heart rate and blood pressure.  Skin for abnormal spots. Counseling Your health care provider may ask you questions about your:  Alcohol, tobacco, and drug use.  Emotional well-being.  Home and relationship well-being.  Sexual activity.  Eating habits.  Work and work environment.  Method of birth control.  Menstrual cycle.  Pregnancy history. What immunizations do I need?  Influenza (flu) vaccine  This is recommended every year. Tetanus, diphtheria, and pertussis (Tdap) vaccine  You may need a Td booster every 10 years. Varicella (chickenpox) vaccine  You may need this if you have not been vaccinated. Human papillomavirus (HPV)  vaccine  If recommended by your health care provider, you may need three doses over 6 months. Measles, mumps, and rubella (MMR) vaccine  You may need at least one dose of MMR. You may also need a second dose. Meningococcal conjugate (MenACWY) vaccine  One dose is recommended if you are age 19-21 years and a first-year college student living in a residence hall, or if you have one of several medical conditions. You may also need additional booster doses. Pneumococcal conjugate (PCV13) vaccine  You may need this if you have certain conditions and were not previously vaccinated. Pneumococcal polysaccharide (PPSV23) vaccine  You may need one or two doses if you smoke cigarettes or if you have certain conditions. Hepatitis A vaccine  You may need this if you have certain conditions or if you travel or work in places where you may be exposed to hepatitis A. Hepatitis B vaccine  You may need this if you have certain conditions or if you travel or work in places where you may be exposed to hepatitis B. Haemophilus influenzae type b (Hib) vaccine  You may need this if you have certain conditions. You may receive vaccines as individual doses or as more than one vaccine together in one shot (combination vaccines). Talk with your health care provider about the risks and benefits of combination vaccines. What tests do I need?  Blood tests  Lipid and cholesterol levels. These may be checked every 5 years starting at age 20.  Hepatitis C test.  Hepatitis B test. Screening  Diabetes screening. This is done by checking your blood sugar (glucose) after you have   not eaten for a while (fasting).  Sexually transmitted disease (STD) testing.  BRCA-related cancer screening. This may be done if you have a family history of breast, ovarian, tubal, or peritoneal cancers.  Pelvic exam and Pap test. This may be done every 3 years starting at age 21. Starting at age 30, this may be done every 5 years if  you have a Pap test in combination with an HPV test. Talk with your health care provider about your test results, treatment options, and if necessary, the need for more tests. Follow these instructions at home: Eating and drinking   Eat a diet that includes fresh fruits and vegetables, whole grains, lean protein, and low-fat dairy.  Take vitamin and mineral supplements as recommended by your health care provider.  Do not drink alcohol if: ? Your health care provider tells you not to drink. ? You are pregnant, may be pregnant, or are planning to become pregnant.  If you drink alcohol: ? Limit how much you have to 0-1 drink a day. ? Be aware of how much alcohol is in your drink. In the [place] one drink equals one 12 oz bottle of beer (355 mL), one 5 oz glass of wine (148 mL), or one 1 oz glass of hard liquor (44 mL). Lifestyle  Take daily care of your teeth and gums.  Stay active. Exercise for at least 30 minutes on 5 or more days each week.  Do not use any products that contain nicotine or tobacco, such as cigarettes, e-cigarettes, and chewing tobacco. If you need help quitting, ask your health care provider.  If you are sexually active, practice safe sex. Use a condom or other form of birth control (contraception) in order to prevent pregnancy and STIs (sexually transmitted infections). If you plan to become pregnant, see your health care provider for a preconception visit. What's next?  Visit your health care provider once a year for a well check visit.  Ask your health care provider how often you should have your eyes and teeth checked.  Stay up to date on all vaccines. This information is not intended to replace advice given to you by your health care provider. Make sure you discuss any questions you have with your health care provider. Document Revised: 05/18/2018 Document Reviewed: 05/18/2018 Elsevier Patient Education  2020 Elsevier Inc.   

## 2020-03-28 LAB — HEPATITIS C ANTIBODY
Hepatitis C Ab: NONREACTIVE
SIGNAL TO CUT-OFF: 0.04 (ref <1.00)

## 2020-04-07 ENCOUNTER — Encounter: Payer: Self-pay | Admitting: Family Medicine

## 2020-04-07 ENCOUNTER — Ambulatory Visit (INDEPENDENT_AMBULATORY_CARE_PROVIDER_SITE_OTHER): Payer: No Typology Code available for payment source | Admitting: Family Medicine

## 2020-04-07 ENCOUNTER — Other Ambulatory Visit: Payer: Self-pay

## 2020-04-07 VITALS — BP 110/68 | HR 79 | Temp 98.0°F | Ht 61.0 in | Wt 119.8 lb

## 2020-04-07 DIAGNOSIS — H9313 Tinnitus, bilateral: Secondary | ICD-10-CM

## 2020-04-07 DIAGNOSIS — R94120 Abnormal auditory function study: Secondary | ICD-10-CM

## 2020-04-07 NOTE — Patient Instructions (Signed)
Please follow up if symptoms do not improve or as needed.   We will call you to get you scheduled with ENT (an ear specialist).

## 2020-04-07 NOTE — Progress Notes (Signed)
Subjective  CC:  Chief Complaint  Patient presents with  . Ear Pain    left ear rings, sounds dull and cannot hear well at times. Started around May 2021. "noise" in ear is a new problem, but improving    HPI: Chelsea Collins is a 34 y.o. female who presents to the office today to address the problems listed above in the chief complaint.  See notes from march/april: seen for ringing in the ears and fullness. Had normal hearing screen at that time. Treated with flonase and sxs improved. However, now with increased ringing daily occ "twinges" of pain, and worries she can't hear as well. On zyrtec as well.    Assessment  1. Tinnitus of both ears   2. Abnormal hearing screen      Plan   Refer to ENT for persistent sxs and pt concern. Continue flonase.   Follow up: prn  Visit date not found  Orders Placed This Encounter  Procedures  . Ambulatory referral to ENT   No orders of the defined types were placed in this encounter.     I reviewed the patients updated PMH, FH, and SocHx.    Patient Active Problem List   Diagnosis Date Noted  . Postpartum depression 03/01/2019  . Complex partial seizure (HCC) 03/21/2018  . Encephalomalacia 03/21/2018  . GAD (generalized anxiety disorder) 03/21/2018  . Epilepsy (HCC) 01/30/2017  . Abnormal MRI of head 10/27/2015  . History of bacterial meningitis in infancy 10/27/2015   Current Meds  Medication Sig  . busPIRone (BUSPAR) 10 MG tablet Take 40 mg by mouth 2 (two) times daily.  . cetirizine (ZYRTEC) 10 MG tablet Take 1 tablet (10 mg total) by mouth daily.  . fluticasone (FLONASE) 50 MCG/ACT nasal spray Place 1 spray into both nostrils daily.  . mirtazapine (REMERON) 15 MG tablet Take 15 mg by mouth at bedtime.  . Oxcarbazepine (TRILEPTAL) 300 MG tablet Take 1 tablet (300 mg total) by mouth 2 (two) times daily.    Allergies: Patient is allergic to keppra [levetiracetam] and lamictal [lamotrigine]. Family History: Patient family  history includes Autism in her brother; Bipolar disorder in her brother; Cancer in her maternal grandmother; Depression in her mother; Diabetes in her maternal grandmother; Heart attack in her maternal grandfather; Heart disease in her maternal grandfather; Hypertension in her maternal grandfather; Intellectual disability in her brother; Miscarriages / [place] in her mother. Social History:  Patient  reports that she has never smoked. She has never used smokeless tobacco. She reports that she does not drink alcohol and does not use drugs.  Review of Systems: Constitutional: Negative for fever malaise or anorexia Cardiovascular: negative for chest pain Respiratory: negative for SOB or persistent cough Gastrointestinal: negative for abdominal pain  Objective  Vitals: BP 110/68 (BP Location: Left Arm, Patient Position: Sitting, Cuff Size: Normal)   Pulse 79   Temp 98 F (36.7 C) (Temporal)   Ht 5\' 1"  (1.549 m)   Wt 119 lb 12.8 oz (54.3 kg)   SpO2 99%   BMI 22.64 kg/m  General: no acute distress , A&Ox3 HEENT: PEERL, TMs appear normal bilateral.    Hearing Screening   Method: Audiometry   125Hz  250Hz  500Hz  1000Hz  2000Hz  3000Hz  4000Hz  6000Hz  8000Hz   Right ear:           Left ear:     Refer Refer Pass         Commons side effects, risks, benefits, and alternatives for medications and treatment plan  prescribed today were discussed, and the patient expressed understanding of the given instructions. Patient is instructed to call or message via MyChart if he/she has any questions or concerns regarding our treatment plan. No barriers to understanding were identified. We discussed Red Flag symptoms and signs in detail. Patient expressed understanding regarding what to do in case of urgent or emergency type symptoms.   Medication list was reconciled, printed and provided to the patient in AVS. Patient instructions and summary information was reviewed with the patient as documented in the  AVS. This note was prepared with assistance of Dragon voice recognition software. Occasional wrong-word or sound-a-like substitutions may have occurred due to the inherent limitations of voice recognition software  This visit occurred during the SARS-CoV-2 public health emergency.  Safety protocols were in place, including screening questions prior to the visit, additional usage of staff PPE, and extensive cleaning of exam room while observing appropriate contact time as indicated for disinfecting solutions.

## 2020-04-08 ENCOUNTER — Encounter: Payer: Self-pay | Admitting: Family Medicine

## 2020-04-14 ENCOUNTER — Encounter: Payer: Self-pay | Admitting: Neurology

## 2020-04-14 ENCOUNTER — Other Ambulatory Visit: Payer: Self-pay

## 2020-04-14 ENCOUNTER — Ambulatory Visit (INDEPENDENT_AMBULATORY_CARE_PROVIDER_SITE_OTHER): Payer: No Typology Code available for payment source | Admitting: Neurology

## 2020-04-14 ENCOUNTER — Other Ambulatory Visit: Payer: Self-pay | Admitting: Neurology

## 2020-04-14 VITALS — BP 104/70 | HR 75 | Ht 61.0 in | Wt 121.4 lb

## 2020-04-14 DIAGNOSIS — G40309 Generalized idiopathic epilepsy and epileptic syndromes, not intractable, without status epilepticus: Secondary | ICD-10-CM | POA: Diagnosis not present

## 2020-04-14 MED ORDER — OXCARBAZEPINE 300 MG PO TABS: 300.0000 mg | ORAL_TABLET | Freq: Two times a day (BID) | ORAL | 3 refills | Status: DC

## 2020-04-14 MED FILL — OXcarbazepine 300 MG TABS: 300 | 90 days supply | Qty: 180 | Fill #0

## 2020-04-14 NOTE — Progress Notes (Signed)
NEUROLOGY FOLLOW UP OFFICE NOTE  Chelsea Collins [id number] Aug 29, 1986  HISTORY OF PRESENT ILLNESS: I had the pleasure of seeing Chelsea Collins in follow-up in the neurology clinic on 04/14/2020.  The patient was last seen 8 months ago for seizures. She is alone in the office today. She denies any seizures or seizure-like symptoms since May 2018. No staring/unresponsive episodes, gaps in time, olfactory/gustatory hallucinations, focal numbness/tingling/weakness, myoclonic jerks. No side effects on Oxcarbazepine 300mg  BID. She denies any headaches, dizziness, no falls. She has had left-sided tinnitus and muffled hearing for the past 2-3 months. She had a hearing test that she passed and allergy medication seemed to help initially, then she was tried on PO allergy medication instead of the nasal spray. She continued to report tinnitus and muffled hearing and states she failed the second hearing test, now scheduled to see ENT next week. She will be going back to school in August to do online grad school in rehabilitation counseling. They are looking into planning for another pregnancy in 2 years.   History on Initial Assessment 01/24/2017: This is a pleasant 34 yo RH woman with a history of strep B meningitis in infancy with residual peripheral vision impairment, with  epilepsy. She was diagnosed with epilepsy in childhood when she was having staring spells. She reports that seizures stopped at age 34 and she was taken off seizure medication at age 34. She denies any myoclonic jerks or convulsions. In 2012, she started having episodes where her left leg would become weak for a minute or so, sometimes it felt like her left arm was also affected. She had an MRI brain in [place] reported as showing previous injury to the occipital lobe. She saw a neurologist at that time with concern for MS, she was reassured there was no evidence of MS. In 2014, she was in a classroom and had an episode where she apparently walked  toward the wall and stood there just staring and unresponsive. She was amnestic of the event until her supervisor called her to the office and told her what happened. She recalls another episode that same semester, she was stressed and waiting in the lobby, then did not know how she got there. She had an MRI brain without contrast which did not show any acute changes, there was note of ulceration in the calcarine cortex of both hemispheres, left greater than right, increased FLAIR signal going into the deep while matter with decreased signal centrally. Wake and drowsy EEG was normal. When she left school, she reports episodes were not as bad, she was still having difficulties with her left side and started having troubles with anxiety as well. She would be at a store and would not remember a word or a color. 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 She had to sit on the floor and was pale. When she moved to [place], she started having episodes of feeling hot, nauseated, weird, pale, as well as zoning out ("walking into traffic"). She saw neurologist Dr. Waterford and had a repeat EEG which was abnormal, during hyperventilation which was performed twice, there were intermittent short bursts of generalized spike sharp wave, indicating a generalized epilepsy disorder. She was started on Lamotrigine but 5 weeks later had a severe allergic reaction with swollen lymph nodes, night sweats,  then rash. She was switched to Keppra and had a more severe immediate reaction within 1-2 days with rash and facial swelling. She was then switched to Trileptal which she tolerated well, and reports that all the symptoms she was having went away. She last saw Dr. Terrace Arabia in February to discuss Trileptal and contraceptives, and made the decision to switch to Vimpat.  She presents today asking to be switched back to Trileptal, stating that the Vimpat was not controlling her symptoms. She would have episodes of turning [place], nausea, vision change, staring off into space unresponsive. She was also having headaches with vomiting. Her psychiatrist was concerned that Remeron was also contributing to symptoms, but she had been taking Remeron with Trileptal without these symptoms.   She also reports episodes where she would have a sudden speech impediment lasting 30 seconds, where she knows what she wants to say but cannot get the words out or form the words. Twice in the past 2 years she has felt a sensation of fear prior to the speech difficulties. She denies any olfactory/gustatory hallucinations, the left-sided symptoms have not recurred since 2014, no myoclonic jerks. She has had headaches every other day when she is late to take her Vimpat dose, resolving after she takes the medication. Pain is around her eyes with some photosensitivity, no nausea/vomiting. She denies any diplopia, dizziness, neck/back pain, bowel/bladder dysfunction.  Epilepsy Risk Factors:  Bacterial meningitis in infancy with encephalomalacia in the bilateral occipital lobes. Her maternal cousin had seizures in childhood, a maternal uncle had 2 seizures felt to be related to glucose levels. Otherwise she had a normal birth and early development.  There is no history of febrile convulsions, significant traumatic brain injury, neurosurgical procedures.  Prior AEDs: Keppra, Lamotrigine, Trileptal  PAST MEDICAL HISTORY: Past Medical History:  Diagnosis Date  . Anxiety disorder   . Encephalomalacia 03/21/2018   Occipital bilateral  . GAD (generalized anxiety disorder) 03/21/2018  . Heat intolerance   . Hypoglycemia   . Meningitis   . Postpartum depression 03/01/2019  . Seizures (HCC)    as an infant    MEDICATIONS: Current Outpatient Medications on File Prior to Visit  Medication Sig Dispense  Refill  . busPIRone (BUSPAR) 10 MG tablet Take 40 mg by mouth 2 (two) times daily. Take 2 tablets at night    . cetirizine (ZYRTEC) 10 MG tablet Take 1 tablet (10 mg total) by mouth daily. 30 tablet 11  . fluticasone (FLONASE) 50 MCG/ACT nasal spray Place 1 spray into both nostrils daily. 16 g 2  . mirtazapine (REMERON) 15 MG tablet Take 15 mg by mouth at bedtime.    . Oxcarbazepine (TRILEPTAL) 300 MG tablet Take 1 tablet (300 mg total) by mouth 2 (two) times daily. 180 tablet 3   No current facility-administered medications on file prior to visit.    ALLERGIES: Allergies  Allergen Reactions  . Keppra [Levetiracetam] Swelling and Rash  . Lamictal [Lamotrigine] Rash    FAMILY HISTORY: Family History  Problem Relation Age of Onset  . Depression Mother   . Miscarriages / [place] Mother   . Hypertension Maternal Grandfather   . Heart disease Maternal Grandfather   . Heart attack Maternal Grandfather   . Intellectual disability Brother   . Bipolar disorder Brother   . Autism Brother   . Diabetes Maternal Grandmother   . Cancer Maternal Grandmother     SOCIAL HISTORY: Social History   Socioeconomic History  . Marital status: Married  Spouse name: Not on file  . Number of children: 0  . Years of education: 39  . Highest education level: Not on file  Occupational History    Comment: [employer]  Tobacco Use  . Smoking status: Never Smoker  . Smokeless tobacco: Never Used  Vaping Use  . Vaping Use: Never used  Substance and Sexual Activity  . Alcohol use: No    Alcohol/week: 0.0 standard drinks  . Drug use: No  . Sexual activity: Yes    Partners: Male    Birth control/protection: Condom  Other Topics Concern  . Not on file  Social History Narrative   Lives with two roommates.     Right-handed.   Two 12oz cans of soda daily.   Social Determinants of Health   Financial Resource Strain:   . Difficulty of Paying Living Expenses:   Food Insecurity:   . Worried  About Programme researcher, broadcasting/film/video in the Last Year:   . Barista in the Last Year:   Transportation Needs:   . Freight forwarder (Medical):   Marland Kitchen Lack of Transportation (Non-Medical):   Physical Activity:   . Days of Exercise per Week:   . Minutes of Exercise per Session:   Stress:   . Feeling of Stress :   Social Connections:   . Frequency of Communication with Friends and Family:   . Frequency of Social Gatherings with Friends and Family:   . Attends Religious Services:   . Active Member of Clubs or Organizations:   . Attends Banker Meetings:   Marland Kitchen Marital Status:   Intimate Partner Violence:   . Fear of Current or Ex-Partner:   . Emotionally Abused:   Marland Kitchen Physically Abused:   . Sexually Abused:     PHYSICAL EXAM: Vitals:   04/14/20 1532  BP: 104/70  Pulse: 75  SpO2: 96%   General: No acute distress Head:  Normocephalic/atraumaticSkin/Extremities: No rash, no edema Neurological Exam: alert and oriented to person, place, and time. No aphasia or dysarthria. Fund of knowledge is appropriate.  Recent and remote memory are intact.  Attention and concentration are normal. Cranial nerves: Pupils equal, round. Extraocular movements intact except for left eye abduction and left esotropia. Restricted visual fields, more on the left. No facial asymmetry.  Motor: Bulk and tone normal, muscle strength 5/5 throughout with no pronator drift.  Finger to nose testing intact.  Gait narrow-based and steady, able to tandem walk adequately.  Romberg negative.   IMPRESSION: This is a pleasant 34 yo RH woman with a history of bacterial meningitis in infancy with bilateral occipital encephalomalacia, seizures in childhood with staring spells that initially stopped at age 31 but recurred in 2014. She also started having recurrent episodes of left-sided weakness in 2014, denies any further similar symptoms since then. She reports recurrent episodes where she becomes pale, nauseated, and has  had staring spells that stopped after Trileptal, but recurred with switch to Vimpat. EEG showed generalized discharges during hyperventilation suggestive of a generalized epilepsy. She has been seizure-free since May 2018 on oxcarbazepine 300mg  BID, refills sent. She does not drive. Follow-up with ENT for hearing loss and tinnitus. Follow-up in 1 year, she knows to call for any changes.   Thank you for allowing me to participate in her care.  Please do not hesitate to call for any questions or concerns.   , M.D.   CC: Dr. Patrcia Dolly

## 2020-04-14 NOTE — Patient Instructions (Signed)
Great to see you! Continue Trileptal 300mg  twice a day. Follow-up in 1 year, call for any changes.  Seizure Precautions: 1. If medication has been prescribed for you to prevent seizures, take it exactly as directed.  Do not stop taking the medicine without talking to your doctor first, even if you have not had a seizure in a long time.   2. Avoid activities in which a seizure would cause danger to yourself or to others.  Don't operate dangerous machinery, swim alone, or climb in high or dangerous places, such as on ladders, roofs, or girders.  Do not drive unless your doctor says you may.  3. If you have any warning that you may have a seizure, lay down in a safe place where you can't hurt yourself.    4.  No driving for 6 months from last seizure, as per [facility].   Please refer to the following link on the Epilepsy Foundation of America's website for more information: http://www.epilepsyfoundation.org/answerplace/Social/driving/drivingu.cfm   5.  Maintain good sleep hygiene. Avoid alcohol.  6.  Notify your neurology if you are planning pregnancy or if you become pregnant.  7.  Contact your doctor if you have any problems that may be related to the medicine you are taking.  8.  Call 911 and bring the patient back to the ED if:        A.  The seizure lasts longer than 5 minutes.       B.  The patient doesn't awaken shortly after the seizure  C.  The patient has new problems such as difficulty seeing, speaking or moving  D.  The patient was injured during the seizure  E.  The patient has a temperature over 102 F (39C)  F.  The patient vomited and now is having trouble breathing

## 2020-04-16 ENCOUNTER — Telehealth: Payer: Self-pay

## 2020-04-16 ENCOUNTER — Other Ambulatory Visit: Payer: Self-pay

## 2020-04-16 DIAGNOSIS — H9313 Tinnitus, bilateral: Secondary | ICD-10-CM

## 2020-04-16 DIAGNOSIS — R94120 Abnormal auditory function study: Secondary | ICD-10-CM

## 2020-04-16 NOTE — Telephone Encounter (Signed)
Pt needs referral to [facility], Dr Rayetta Humphrey and Fredrich Romans. The referral sent previously is out of network

## 2020-04-16 NOTE — Telephone Encounter (Signed)
I have changed referral

## 2020-04-24 ENCOUNTER — Other Ambulatory Visit: Payer: Self-pay

## 2020-04-24 ENCOUNTER — Telehealth: Payer: Self-pay | Admitting: Family Medicine

## 2020-04-24 ENCOUNTER — Ambulatory Visit (INDEPENDENT_AMBULATORY_CARE_PROVIDER_SITE_OTHER): Payer: No Typology Code available for payment source | Admitting: Otolaryngology

## 2020-04-24 ENCOUNTER — Encounter (INDEPENDENT_AMBULATORY_CARE_PROVIDER_SITE_OTHER): Payer: Self-pay | Admitting: Otolaryngology

## 2020-04-24 ENCOUNTER — Encounter (INDEPENDENT_AMBULATORY_CARE_PROVIDER_SITE_OTHER): Payer: Self-pay

## 2020-04-24 VITALS — Temp 97.5°F

## 2020-04-24 DIAGNOSIS — H9120 Sudden idiopathic hearing loss, unspecified ear: Secondary | ICD-10-CM | POA: Diagnosis not present

## 2020-04-24 DIAGNOSIS — H9312 Tinnitus, left ear: Secondary | ICD-10-CM | POA: Diagnosis not present

## 2020-04-24 MED FILL — predniSONE 10 MG TABS: 10 | 16 days supply | Qty: 65 | Fill #0

## 2020-04-24 NOTE — Progress Notes (Unsigned)
Sent a referral for audio testing for tinnitus to [facility]. Pt needed referral for insurance purposes.

## 2020-04-24 NOTE — Telephone Encounter (Signed)
Patient is calling in asking for a audiologist, states the other one was not in network asked for a referral to [facility] -with [facility].

## 2020-04-24 NOTE — Progress Notes (Signed)
HPI: Chelsea Collins is a 34 y.o. female who presents is referred by her PCP for evaluation of left ear complaints.  She apparently has had history of allergies and has been taking allergy medication including Flonase that seemed to help the ear symptoms.  She had previously had intermittent tinnitus in the ears as well as ear fullness.  However about 2 and half weeks ago she developed persistent left ear tinnitus with decreased hearing in the left ear.  She has had persistent problems despite use of her allergy medication.  She brings with her a hearing test today that demonstrated a left ear mostly low-frequency sensorineural hearing loss.  Hearing in the right ear was normal at approximately 10 dB.  The left ear demonstrated low-frequency hearing of approximately 60 dB and rising to about 20 dB in the upper frequencies.  This appears to be a sensorineural hearing loss. She has had no vertigo or dizziness.  Past Medical History:  Diagnosis Date  . Anxiety disorder   . Encephalomalacia 03/21/2018   Occipital bilateral  . GAD (generalized anxiety disorder) 03/21/2018  . Heat intolerance   . Hypoglycemia   . Meningitis   . Postpartum depression 03/01/2019  . Seizures (HCC)    as an infant   Past Surgical History:  Procedure Laterality Date  . EYE SURGERY    . TYMPANOSTOMY TUBE PLACEMENT     Social History   Socioeconomic History  . Marital status: Married    Spouse name: Not on file  . Number of children: 0  . Years of education: 69  . Highest education level: Not on file  Occupational History    Comment: [employer]  Tobacco Use  . Smoking status: Never Smoker  . Smokeless tobacco: Never Used  Vaping Use  . Vaping Use: Never used  Substance and Sexual Activity  . Alcohol use: No    Alcohol/week: 0.0 standard drinks  . Drug use: No  . Sexual activity: Yes    Partners: Male    Birth control/protection: Condom  Other Topics Concern  . Not on file  Social History Narrative   Lives with  two roommates.     Right-handed.   Two 12oz cans of soda daily.   Social Determinants of Health   Financial Resource Strain:   . Difficulty of Paying Living Expenses:   Food Insecurity:   . Worried About Programme researcher, broadcasting/film/video in the Last Year:   . Barista in the Last Year:   Transportation Needs:   . Freight forwarder (Medical):   Marland Kitchen Lack of Transportation (Non-Medical):   Physical Activity:   . Days of Exercise per Week:   . Minutes of Exercise per Session:   Stress:   . Feeling of Stress :   Social Connections:   . Frequency of Communication with Friends and Family:   . Frequency of Social Gatherings with Friends and Family:   . Attends Religious Services:   . Active Member of Clubs or Organizations:   . Attends Banker Meetings:   Marland Kitchen Marital Status:    Family History  Problem Relation Age of Onset  . Depression Mother   . Miscarriages / [place] Mother   . Hypertension Maternal Grandfather   . Heart disease Maternal Grandfather   . Heart attack Maternal Grandfather   . Intellectual disability Brother   . Bipolar disorder Brother   . Autism Brother   . Diabetes Maternal Grandmother   . Cancer Maternal Grandmother  Allergies  Allergen Reactions  . Keppra [Levetiracetam] Swelling and Rash  . Lamictal [Lamotrigine] Rash   Prior to Admission medications   Medication Sig Start Date End Date Taking? Authorizing Provider  busPIRone (BUSPAR) 10 MG tablet Take 40 mg by mouth 2 (two) times daily. Take 2 tablets at night   Yes [provider]  cetirizine (ZYRTEC) 10 MG tablet Take 1 tablet (10 mg total) by mouth daily. 03/27/20  Yes [name], MD  fluticasone (FLONASE) 50 MCG/ACT nasal spray Place 1 spray into both nostrils daily. 11/23/19  Yes [name], MD  mirtazapine (REMERON) 15 MG tablet Take 15 mg by mouth at bedtime.   Yes [provider]  Oxcarbazepine (TRILEPTAL) 300 MG tablet Take 1 tablet (300 mg total) by  mouth 2 (two) times daily. 04/14/20  Yes Van Clines, MD     Positive ROS: Otherwise negative  All other systems have been reviewed and were otherwise negative with the exception of those mentioned in the HPI and as above.  Physical Exam: Constitutional: Alert, well-appearing, no acute distress Ears: External ears without lesions or tenderness. Ear canals are clear bilaterally.  TMs are clear bilaterally with good mobility on pneumatic otoscopy. Nasal: External nose without lesions.  Mild rhinitis with clear nasal passages otherwise with no signs of infection. Oral: Lips and gums without lesions. Tongue and palate mucosa without lesions. Posterior oropharynx clear. Neck: No palpable adenopathy or masses. Respiratory: Breathing comfortably  Skin: No facial/neck lesions or rash noted.  Procedures  Assessment: Sudden left ear sensorineural hearing loss with secondary tinnitus.  Plan: Place her on a steroid taper starting with 60 mg for 5 days followed by 50 mg for 3 days and then 40 mg for 2 days, 30 mg for 2 days, 20 mg for 2 days and 10 mg for 2 days. She will follow-up in 2 to 3 weeks for recheck and repeat audiologic testing.   Narda Bonds, MD   CC:

## 2020-04-25 ENCOUNTER — Encounter (INDEPENDENT_AMBULATORY_CARE_PROVIDER_SITE_OTHER): Payer: Self-pay

## 2020-04-25 ENCOUNTER — Other Ambulatory Visit: Payer: Self-pay

## 2020-04-25 DIAGNOSIS — R94120 Abnormal auditory function study: Secondary | ICD-10-CM

## 2020-04-25 DIAGNOSIS — H9313 Tinnitus, bilateral: Secondary | ICD-10-CM

## 2020-04-25 NOTE — Telephone Encounter (Signed)
Referral ordered

## 2020-05-15 ENCOUNTER — Ambulatory Visit (INDEPENDENT_AMBULATORY_CARE_PROVIDER_SITE_OTHER): Payer: No Typology Code available for payment source | Admitting: Otolaryngology

## 2020-05-15 ENCOUNTER — Other Ambulatory Visit: Payer: Self-pay

## 2020-05-15 ENCOUNTER — Telehealth: Payer: Self-pay

## 2020-05-15 VITALS — Temp 97.7°F

## 2020-05-15 DIAGNOSIS — H9042 Sensorineural hearing loss, unilateral, left ear, with unrestricted hearing on the contralateral side: Secondary | ICD-10-CM

## 2020-05-15 NOTE — Telephone Encounter (Signed)
Patient said to disregard, that the ENT order it she talked to her insurance.

## 2020-05-15 NOTE — Progress Notes (Signed)
HPI: Chelsea Collins is a 34 y.o. female who returns today for evaluation of left ear low-frequency SNHL.  She apparently has been having problems with ringing in the left ear for several months but only recently noted problems with her hearing.  She underwent audiologic testing that demonstrated a low-frequency hearing loss of approximately 60 DB performed in mid July.  I saw her initially on August 5 and treated with steroids for 2 weeks and she returns today for repeat audiologic testing.  On repeat audiologic testing performed on 05/12/2020 demonstrated essentially no change in her hearing with predominantly low-frequency sensorineural hearing loss in the left ear that rises to close to normal in the upper frequencies at 4000 frequency. She has had no headaches or vertigo.  She has not noted any significant change in her hearing although the ringing may be better.  Past Medical History:  Diagnosis Date  . Anxiety disorder   . Encephalomalacia 03/21/2018   Occipital bilateral  . GAD (generalized anxiety disorder) 03/21/2018  . Heat intolerance   . Hypoglycemia   . Meningitis   . Postpartum depression 03/01/2019  . Seizures (HCC)    as an infant   Past Surgical History:  Procedure Laterality Date  . EYE SURGERY    . TYMPANOSTOMY TUBE PLACEMENT     Social History   Socioeconomic History  . Marital status: Married    Spouse name: Not on file  . Number of children: 0  . Years of education: 44  . Highest education level: Not on file  Occupational History    Comment: [employer]  Tobacco Use  . Smoking status: Never Smoker  . Smokeless tobacco: Never Used  Vaping Use  . Vaping Use: Never used  Substance and Sexual Activity  . Alcohol use: No    Alcohol/week: 0.0 standard drinks  . Drug use: No  . Sexual activity: Yes    Partners: Male    Birth control/protection: Condom  Other Topics Concern  . Not on file  Social History Narrative   Lives with two roommates.     Right-handed.   Two  12oz cans of soda daily.   Social Determinants of Health   Financial Resource Strain:   . Difficulty of Paying Living Expenses: Not on file  Food Insecurity:   . Worried About Programme researcher, broadcasting/film/video in the Last Year: Not on file  . Ran Out of Food in the Last Year: Not on file  Transportation Needs:   . Lack of Transportation (Medical): Not on file  . Lack of Transportation (Non-Medical): Not on file  Physical Activity:   . Days of Exercise per Week: Not on file  . Minutes of Exercise per Session: Not on file  Stress:   . Feeling of Stress : Not on file  Social Connections:   . Frequency of Communication with Friends and Family: Not on file  . Frequency of Social Gatherings with Friends and Family: Not on file  . Attends Religious Services: Not on file  . Active Member of Clubs or Organizations: Not on file  . Attends Banker Meetings: Not on file  . Marital Status: Not on file   Family History  Problem Relation Age of Onset  . Depression Mother   . Miscarriages / [place] Mother   . Hypertension Maternal Grandfather   . Heart disease Maternal Grandfather   . Heart attack Maternal Grandfather   . Intellectual disability Brother   . Bipolar disorder Brother   .  Autism Brother   . Diabetes Maternal Grandmother   . Cancer Maternal Grandmother    Allergies  Allergen Reactions  . Keppra [Levetiracetam] Swelling and Rash  . Lamictal [Lamotrigine] Rash   Prior to Admission medications   Medication Sig Start Date End Date Taking? Authorizing Provider  busPIRone (BUSPAR) 10 MG tablet Take 40 mg by mouth 2 (two) times daily. Take 2 tablets at night   Yes [provider]  cetirizine (ZYRTEC) 10 MG tablet Take 1 tablet (10 mg total) by mouth daily. 03/27/20  Yes [name], MD  fluticasone (FLONASE) 50 MCG/ACT nasal spray Place 1 spray into both nostrils daily. 11/23/19  Yes [name], MD  mirtazapine (REMERON) 15 MG tablet Take 15 mg by mouth at  bedtime.   Yes [provider]  Oxcarbazepine (TRILEPTAL) 300 MG tablet Take 1 tablet (300 mg total) by mouth 2 (two) times daily. 04/14/20  Yes Van Clines, MD     Positive ROS: Otherwise negative  All other systems have been reviewed and were otherwise negative with the exception of those mentioned in the HPI and as above.  Physical Exam: Constitutional: Alert, well-appearing, no acute distress Ears: External ears without lesions or tenderness. Ear canals are clear bilaterally with intact, clear TMs bilaterally.  TMs with good mobility on pneumatic otoscopy no middle ear space abnormality noted. Nasal: External nose without lesions. Clear nasal passages Oral: Lips and gums without lesions. Tongue and palate mucosa without lesions. Posterior oropharynx clear. Neck: No palpable adenopathy or masses Respiratory: Breathing comfortably  Skin: No facial/neck lesions or rash noted.  Procedures  Assessment: Left ear predominately low-frequency SNHL.  Plan: We will plan on obtaining MRI scan to rule out any retrocochlear pathology.  She will call us concerning results following the MRI scan.  Further therapy this will involve hearing aid use and discussed this with her   Narda Bonds, MD

## 2020-05-15 NOTE — Telephone Encounter (Signed)
Pt wants Dr. Mardelle Matte to know that she saw the ENT and her hearing loss is permanent, unless due to a tumor. The ENT is sending the pt for a scan to check for a tumor. Pt has [place] focus plan for insurance and is calling them to see who she needs a referral from in order for insurance to cover it. She is going to call us back if she needs a referral from Dr. Mardelle Matte as well.

## 2020-05-16 ENCOUNTER — Other Ambulatory Visit (INDEPENDENT_AMBULATORY_CARE_PROVIDER_SITE_OTHER): Payer: Self-pay

## 2020-05-16 DIAGNOSIS — H9042 Sensorineural hearing loss, unilateral, left ear, with unrestricted hearing on the contralateral side: Secondary | ICD-10-CM

## 2020-05-22 ENCOUNTER — Encounter (INDEPENDENT_AMBULATORY_CARE_PROVIDER_SITE_OTHER): Payer: Self-pay

## 2020-05-27 ENCOUNTER — Telehealth: Payer: Self-pay | Admitting: Family Medicine

## 2020-05-27 NOTE — Telephone Encounter (Signed)
Please advise 

## 2020-05-27 NOTE — Telephone Encounter (Signed)
Patient called in this afternoon stating her psychiatrist is closing down their practice and for the time being she is doing virtual visit but does not think her insurance is going to cover this so she is requesting a new psychiatrist, and is wondering if Dr.Andy could manage medication.

## 2020-05-28 ENCOUNTER — Telehealth: Payer: Self-pay | Admitting: Family Medicine

## 2020-05-28 NOTE — Telephone Encounter (Signed)
Please clarify: is psychiatrist office switching to virtual for covid pandemic reasons OR is it closing completely and she needs a new psychiatrist? Insurance will cover virtual visits for covid reasons. If she needs a new psychiatrist, I could refill her medication until she finds one but feels she needs psychiatry to manage her mood.   Thanks.

## 2020-05-28 NOTE — Telephone Encounter (Signed)
Chart has been updated.

## 2020-05-28 NOTE — Telephone Encounter (Signed)
Pt had tdap pertussis vaccine on 11/28/2013

## 2020-06-02 NOTE — Telephone Encounter (Signed)
Psychiatrist office is closing completely - he is moving on to another company. She is actively searching for a new office, wants to know if you suggest anyone?? Aware that you will refill meds until she establishes care

## 2020-06-02 NOTE — Telephone Encounter (Signed)
MyChart message sent to patient.

## 2020-06-02 NOTE — Telephone Encounter (Signed)
Psychiatrists  [facility]  [address] [place], [place] [address] [phone]  [facility] Corie Chiquito, NP [address] [place], [place] [address] [place], [place] [address] [phone]  [facility] Deatra Robinson, NP Verlon Au NP [address] [place], [place] [address] [phone]  [facility] [address] [place], [place] [address]

## 2020-06-05 ENCOUNTER — Ambulatory Visit
Admission: RE | Admit: 2020-06-05 | Discharge: 2020-06-05 | Disposition: A | Discharge status: Home / Self Care | Payer: No Typology Code available for payment source | Source: Ambulatory Visit | Attending: Otolaryngology | Admitting: Otolaryngology

## 2020-06-05 ENCOUNTER — Other Ambulatory Visit: Payer: Self-pay

## 2020-06-05 DIAGNOSIS — H9042 Sensorineural hearing loss, unilateral, left ear, with unrestricted hearing on the contralateral side: Secondary | ICD-10-CM

## 2020-06-05 MED ORDER — GADOBENATE DIMEGLUMINE 529 MG/ML IV SOLN
20.0000 mL | Freq: Once | INTRAVENOUS | Status: AC | PRN
  Administered 2020-06-05: 20 mL via INTRAVENOUS

## 2020-06-06 ENCOUNTER — Encounter: Payer: Self-pay | Admitting: Radiology

## 2020-06-09 NOTE — Telephone Encounter (Signed)
Called the patient today concerning results of her MRI scan.  There was nothing abnormal noted on MRI scan that would account for her left ear SNHL. She relates that the hearing loss may have started back in March instead of July.  She has had no vertigo or dizzy symptoms. I would recommend obtaining hearing aid to treat the hearing loss in the left ear.  I reviewed this with her today on the phone.

## 2020-06-10 ENCOUNTER — Telehealth: Payer: Self-pay

## 2020-06-10 ENCOUNTER — Other Ambulatory Visit: Payer: Self-pay

## 2020-06-10 DIAGNOSIS — H9313 Tinnitus, bilateral: Secondary | ICD-10-CM

## 2020-06-10 DIAGNOSIS — R94120 Abnormal auditory function study: Secondary | ICD-10-CM

## 2020-06-10 DIAGNOSIS — Z7189 Other specified counseling: Secondary | ICD-10-CM

## 2020-06-10 NOTE — Telephone Encounter (Signed)
Referral placed.

## 2020-06-10 NOTE — Telephone Encounter (Signed)
Pt is needing a referral to a person that does hearing aids. She has been scheduled with Florestine Avers [facility] with [facility] .

## 2020-06-12 ENCOUNTER — Telehealth (INDEPENDENT_AMBULATORY_CARE_PROVIDER_SITE_OTHER): Payer: Self-pay | Admitting: Otolaryngology

## 2020-06-12 NOTE — Telephone Encounter (Signed)
Called Chelsea Collins concerning results of her MRI scan for evaluation of left ear hearing loss.  This did not demonstrate any cochlear or retrocochlear pathology or anatomical reason for hearing loss. Discussed with her that I would recommend proceeding with use of a hearing aid for the left ear and will leave a note for hearing aid clearance that she can pick up.

## 2020-09-10 DIAGNOSIS — R350 Frequency of micturition: Secondary | ICD-10-CM | POA: Diagnosis not present

## 2020-09-23 ENCOUNTER — Encounter: Payer: Self-pay | Admitting: Physician Assistant

## 2020-09-23 ENCOUNTER — Other Ambulatory Visit: Payer: Self-pay

## 2020-09-23 ENCOUNTER — Other Ambulatory Visit (HOSPITAL_COMMUNITY)
Admission: RE | Admit: 2020-09-23 | Discharge: 2020-09-23 | Disposition: A | Discharge status: Home / Self Care | Payer: BC Managed Care – PPO | Source: Ambulatory Visit | Attending: Physician Assistant | Admitting: Physician Assistant

## 2020-09-23 ENCOUNTER — Ambulatory Visit: Payer: BC Managed Care – PPO | Admitting: Physician Assistant

## 2020-09-23 VITALS — BP 100/66 | HR 74 | Temp 98.3°F | Ht 61.0 in | Wt 122.2 lb

## 2020-09-23 DIAGNOSIS — R300 Dysuria: Secondary | ICD-10-CM

## 2020-09-23 LAB — POCT URINALYSIS DIPSTICK
Bilirubin, UA: 1
Blood, UA: NEGATIVE
Glucose, UA: NEGATIVE
Ketones, UA: NEGATIVE
Leukocytes, UA: NEGATIVE
Nitrite, UA: NEGATIVE
Protein, UA: NEGATIVE
Spec Grav, UA: >=1.03 — AB (ref 1.010–1.025)
Urobilinogen, UA: 0.2 E.U./dL
pH, UA: 6 (ref 5.0–8.0)

## 2020-09-23 NOTE — Progress Notes (Signed)
Chelsea Collins is a 35 y.o. female here for a follow up of a pre-existing problem.  I acted as a Neurosurgeon for [employer], PA-C Corky Mull, LPN   History of Present Illness:   Chief Complaint  Patient presents with  . Dysuria  . Urinary Urgency    HPI   Dysuria Pt was treated for UTI via telehealth on 12/22 with Macrobid x 5 days and she missed the last dose.   At that time she was having frequent urination, urgency.  She felt like her symptoms have improved up until a few days ago she had recurrence of symptoms.  Denies back pain, nausea, vomiting, fever or chills.  Tried drinking cranberry juice and this has helped some.  Having some slight vaginal discharge but thinks that it is related to her upcoming ovulation. Denies concerns for pregnancy.  Patient's last menstrual period was 09/09/2020 (approximate).    Past Medical History:  Diagnosis Date  . Anxiety disorder   . Encephalomalacia 03/21/2018   Occipital bilateral  . GAD (generalized anxiety disorder) 03/21/2018  . Heat intolerance   . Hypoglycemia   . Meningitis   . Postpartum depression 03/01/2019  . Seizures (HCC)    as an infant     Social History   Tobacco Use  . Smoking status: Never Smoker  . Smokeless tobacco: Never Used  Vaping Use  . Vaping Use: Never used  Substance Use Topics  . Alcohol use: No    Alcohol/week: 0.0 standard drinks  . Drug use: No    Past Surgical History:  Procedure Laterality Date  . EYE SURGERY    . TYMPANOSTOMY TUBE PLACEMENT      Family History  Problem Relation Age of Onset  . Depression Mother   . Miscarriages / [place] Mother   . Hypertension Maternal Grandfather   . Heart disease Maternal Grandfather   . Heart attack Maternal Grandfather   . Intellectual disability Brother   . Bipolar disorder Brother   . Autism Brother   . Diabetes Maternal Grandmother   . Cancer Maternal Grandmother     Allergies  Allergen Reactions  . Keppra  [Levetiracetam] Swelling and Rash  . Lamictal [Lamotrigine] Rash  . Contrast Media [Iodinated Diagnostic Agents] Cough    MRI contrast NOT CT     Current Medications:   Current Outpatient Medications:  .  busPIRone (BUSPAR) 10 MG tablet, Take 40 mg by mouth 2 (two) times daily. Take 2 tablets at night, Disp: , Rfl:  .  mirtazapine (REMERON) 15 MG tablet, Take 15 mg by mouth at bedtime., Disp: , Rfl:  .  Oxcarbazepine (TRILEPTAL) 300 MG tablet, Take 1 tablet (300 mg total) by mouth 2 (two) times daily., Disp: 180 tablet, Rfl: 3   Review of Systems:   ROS  Negative unless otherwise specified per HPI.  Vitals:   Vitals:   09/23/20 1320  BP: 100/66  Pulse: 74  Temp: 98.3 F (36.8 C)  TempSrc: Temporal  SpO2: 97%  Weight: 122 lb 4 oz (55.5 kg)  Height: 5\' 1"  (1.549 m)     Body mass index is 23.1 kg/m.  Physical Exam:   Physical Exam Vitals and nursing note reviewed.  Constitutional:      General: She is not in acute distress.    Appearance: She is well-developed. She is not ill-appearing, toxic-appearing or sickly-appearing.  Cardiovascular:     Rate and Rhythm: Normal rate and regular rhythm.     Pulses: Normal pulses.  Heart sounds: Normal heart sounds, S1 normal and S2 normal.     Comments: No LE edema Pulmonary:     Effort: Pulmonary effort is normal.     Breath sounds: Normal breath sounds.  Abdominal:     Tenderness: There is no right CVA tenderness or left CVA tenderness.  Skin:    General: Skin is warm, dry and intact.  Neurological:     Mental Status: She is alert.     GCS: GCS eye subscore is 4. GCS verbal subscore is 5. GCS motor subscore is 6.  Psychiatric:        Mood and Affect: Mood and affect normal.        Speech: Speech normal.        Behavior: Behavior normal. Behavior is cooperative.       Assessment and Plan:   Talita was seen today for dysuria and urinary urgency.  Diagnoses and all orders for this visit:  Dysuria -     POCT  urinalysis dipstick -     Urine Culture -     Cervicovaginal ancillary only( [place])   No red flags on discussion. Will await urine culture and vaginal swab results to determine need for treatment, patient is comfortable with this plan. She declined STD testing. Worsening precautions advised in the interim.  CMA or LPN served as scribe during this visit. History, Physical, and Plan performed by medical provider. The above documentation has been reviewed and is accurate and complete.   Jarold Motto, PA-C

## 2020-09-23 NOTE — Patient Instructions (Signed)
It was great to see you!  I will be in touch with your urine culture and vaginal swab results. We will treat accordingly.  Please reach out to Dr. Mardelle Matte on her recommendations for you to get your COVID booster.  Take care,  Jarold Motto PA-C

## 2020-09-24 LAB — CERVICOVAGINAL ANCILLARY ONLY
Bacterial Vaginitis (gardnerella): NEGATIVE
Candida Glabrata: NEGATIVE
Candida Vaginitis: NEGATIVE
Comment: NEGATIVE
Comment: NEGATIVE
Comment: NEGATIVE

## 2020-09-24 LAB — URINE CULTURE
MICRO NUMBER:: 11380221
SPECIMEN QUALITY:: ADEQUATE

## 2020-09-30 ENCOUNTER — Encounter: Payer: Self-pay | Admitting: Family Medicine

## 2020-10-23 ENCOUNTER — Telehealth: Payer: Self-pay

## 2020-10-23 NOTE — Telephone Encounter (Signed)
Followed up with patient.   States that billing informed her that this was a charge from 09/23/20 from a UA sent to the hospital for processing.  I have sent billing an email in regard.

## 2020-10-23 NOTE — Telephone Encounter (Signed)
.  Please look into the following billing question below.   Patient has already called billing and was told:  spoke with billing and was told the bill was for her going to the hospital and patient stated she did not go to hospital. And then was told to call insurance, so insurance told her it was her Urinary test she at at the ER and patient has not been to a hospital. She has a urinary test here at our office.   Insurance : [employer]  Patient Name: Chelsea Collins  MKL:[id number] WPV:[id number] Date of Service:09/23/2020 Amount:$[id number]   Patient was advised that this process can take up to a week or more. Patient also was advised that they may receive additional bills while this is being looked into and they are to hold onto all statements until resolved.

## 2020-10-29 MED FILL — OXcarbazepine 300 MG TABS: 300 | 90 days supply | Qty: 180 | Fill #0

## 2020-10-29 NOTE — Telephone Encounter (Signed)
I did review the account and it appears that the [facility] received the specimen and two lab tests were performed.  Unfortunately, with the insurance that she has the [facility] lab is considered as part of the facility and any deductible and co-insurance benefits are applied.  She does owe 207.23, which was applied to the deductible.    This was response from billing.  Billing department will follow up with patient in regard.

## 2020-11-10 ENCOUNTER — Encounter (INDEPENDENT_AMBULATORY_CARE_PROVIDER_SITE_OTHER): Payer: Self-pay

## 2021-01-15 ENCOUNTER — Other Ambulatory Visit: Payer: Self-pay

## 2021-01-15 ENCOUNTER — Ambulatory Visit (INDEPENDENT_AMBULATORY_CARE_PROVIDER_SITE_OTHER): Payer: BC Managed Care – PPO | Admitting: Adult Health

## 2021-01-15 ENCOUNTER — Encounter: Payer: Self-pay | Admitting: Adult Health

## 2021-01-15 VITALS — BP 111/72 | HR 65 | Ht 61.0 in | Wt 122.0 lb

## 2021-01-15 DIAGNOSIS — F53 Postpartum depression: Secondary | ICD-10-CM | POA: Diagnosis not present

## 2021-01-15 DIAGNOSIS — O99345 Other mental disorders complicating the puerperium: Secondary | ICD-10-CM | POA: Diagnosis not present

## 2021-01-15 DIAGNOSIS — F411 Generalized anxiety disorder: Secondary | ICD-10-CM | POA: Diagnosis not present

## 2021-01-15 MED ORDER — SERTRALINE HCL 50 MG PO TABS: 50.0000 mg | ORAL_TABLET | Freq: Every day | ORAL | 2 refills | Status: DC

## 2021-01-15 NOTE — Progress Notes (Signed)
[facility] MD/PA/NP Initial Note  01/15/2021 2:02 PM Chelsea Collins  MRN:  [id number]  Chief Complaint:   HPI:   Describes mood today as "ok". Pleasant. Denies tearfulness. Mood symptoms - denies depression and irritability. Feels anxious at times. Is a Chiropractor". Ruminates on things. Gets hyper-focused. Gets distracted at times - "spacing out". Likes things a certain way, but not with items. Likes to know what's going to happen - how things are going to go. If things change wants to have a plan on what's next. Likes to have a plan "for the rest of my life". Has a schedule at work on things to do. Husband feels like she has things play over and over in her head. Stable interest and motivation. Taking medications as prescribed.  Energy levels "good usually". Active, does not have a regular exercise routine.  Enjoys some usual interests and activities. Married x 4 years. Lives with husband and 21 year old daughter. Family local.Spending time with family. Appetite adequate. Weight stable - 122 pounds - 61". Sleeps well most nights. Averages 6.5 hours during the week. Focus and concentration difficulties - school work - working on [employer] at [employer].  Gets overwhelmed at times and doesn't know where to start. Completing tasks. Managing aspects of household. 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  Denies AH or VH.  Previous medication trials: Trileptal - seizures, Buspar, Mirtazapine,   Visit Diagnosis:    ICD-10-CM   1. Generalized anxiety disorder  F41.1 sertraline (ZOLOFT) 50 MG tablet  2. Postpartum depression  O99.345 sertraline (ZOLOFT) 50 MG tablet   F53.0     Past Psychiatric History: Denies psychiatric hospitalization.  Past Medical History:  Past Medical History:  Diagnosis Date  . Anxiety disorder   . Encephalomalacia 03/21/2018   Occipital bilateral  . GAD (generalized anxiety disorder) 03/21/2018  . Heat intolerance   . Hypoglycemia   . Meningitis   .  Postpartum depression 03/01/2019  . Seizures (HCC)    as an infant    Past Surgical History:  Procedure Laterality Date  . EYE SURGERY    . TYMPANOSTOMY TUBE PLACEMENT       Family Psychiatric History: Family history of mental illness.  Family History:  Family History  Problem Relation Age of Onset  . Depression Mother   . Miscarriages / [place] Mother   . Hypertension Maternal Grandfather   . Heart disease Maternal Grandfather   . Heart attack Maternal Grandfather   . Intellectual disability Brother   . Bipolar disorder Brother   . Autism Brother   . Diabetes Maternal Grandmother   . Cancer Maternal Grandmother     Social History:  Social History   Socioeconomic History  . Marital status: Married    Spouse name: Not on file  . Number of children: 0  . Years of education: 36  . Highest education level: Not on file  Occupational History    Comment: [employer]  Tobacco Use  . Smoking status: Never Smoker  . Smokeless tobacco: Never Used  Vaping Use  . Vaping Use: Never used  Substance and Sexual Activity  . Alcohol use: No    Alcohol/week: 0.0 standard drinks  . Drug use: No  . Sexual activity: Yes    Partners: Male    Birth control/protection: Condom  Other Topics Concern  . Not on file  Social History Narrative   Lives with two roommates.     Right-handed.   Two 12oz cans of soda  daily.   Social Determinants of Health   Financial Resource Strain: Not on file  Food Insecurity: Not on file  Transportation Needs: Not on file  Physical Activity: Not on file  Stress: Not on file  Social Connections: Not on file    Allergies:  Allergies  Allergen Reactions  . Keppra [Levetiracetam] Swelling and Rash  . Lamictal [Lamotrigine] Rash  . Contrast Media [Iodinated Diagnostic Agents] Cough    MRI contrast NOT CT     Metabolic Disorder Labs: No results found for: HGBA1C, MPG No results found for: PROLACTIN Lab Results  Component Value Date   CHOL 188  03/27/2020   TRIG 72.0 03/27/2020   HDL 50.40 03/27/2020   CHOLHDL 4 03/27/2020   VLDL 14.4 03/27/2020   LDLCALC 123 (H) 03/27/2020   LDLCALC 99 03/02/2019   Lab Results  Component Value Date   TSH 0.57 03/02/2019    Therapeutic Level Labs: No results found for: LITHIUM No results found for: VALPROATE No components found for:  CBMZ  Current Medications: Current Outpatient Medications  Medication Sig Dispense Refill  . sertraline (ZOLOFT) 50 MG tablet Take 1 tablet (50 mg total) by mouth daily. 30 tablet 2  . Oxcarbazepine (TRILEPTAL) 300 MG tablet TAKE 1 TABLET BY MOUTH TWO TIMES DAILY 180 tablet 3   No current facility-administered medications for this visit.    Medication Side Effects: none  Orders placed this visit:  No orders of the defined types were placed in this encounter.   Psychiatric Specialty Exam:  Review of Systems  Musculoskeletal: Negative for gait problem.  Neurological: Negative for tremors.  Psychiatric/Behavioral:       Please refer to HPI    Blood pressure 111/72, pulse 65, height 5\' 1"  (1.549 m), weight 122 lb (55.3 kg).Body mass index is 23.05 kg/m.  General Appearance: Casual and Neat  Eye Contact:  Good  Speech:  Clear and Coherent and Normal Rate  Volume:  Normal  Mood:  Euthymic  Affect:  Appropriate and Congruent  Thought Process:  Coherent and Descriptions of Associations: Loose  Orientation:  Full (Time, Place, and Person)  Thought Content: Logical   Suicidal Thoughts:  No  Homicidal Thoughts:  No  Memory:  WNL  Judgement:  Good  Insight:  Good  Psychomotor Activity:  Normal  Concentration:  Concentration: Good  Recall:  Good  Fund of Knowledge: Good  Language: Good  Assets:  Communication Skills Desire for Improvement Financial Resources/Insurance Housing Intimacy Leisure Time Physical Health Resilience Social Support Talents/Skills Transportation Vocational/Educational  ADL's:  Intact  Cognition: WNL   Prognosis:  Good   Screenings:  GAD-7   Flowsheet Row Office Visit from 08/28/2018 in [facility]  Total GAD-7 Score 1    PHQ2-9   Flowsheet Row Office Visit from 11/23/2019 in [facility] from 08/28/2018 in [facility] Office Visit from 03/21/2018 in [facility]  PHQ-2 Total Score 0 0 0  PHQ-9 Total Score -- 1 0      Receiving Psychotherapy: No   Treatment Plan/Recommendations:   Plan:  PDMP reviewed  1. Zoloft 50mg  daily - take 1/2 tablet daily x 7 days, then increase to one tablet daily.  Time spent with patient was 60 minutes. Greater than 50% of face to face time with patient was spent on counseling and coordination of care.    RTC 4 weeks  Patient advised to contact office with any questions,  adverse effects, or acute worsening in signs and symptoms   Dorothyann Gibbs, NP

## 2021-02-12 ENCOUNTER — Telehealth: Payer: BC Managed Care – PPO | Admitting: Adult Health

## 2021-02-12 ENCOUNTER — Encounter: Payer: Self-pay | Admitting: Adult Health

## 2021-02-12 ENCOUNTER — Telehealth (INDEPENDENT_AMBULATORY_CARE_PROVIDER_SITE_OTHER): Payer: BC Managed Care – PPO | Admitting: Adult Health

## 2021-02-12 DIAGNOSIS — F53 Postpartum depression: Secondary | ICD-10-CM | POA: Diagnosis not present

## 2021-02-12 DIAGNOSIS — F411 Generalized anxiety disorder: Secondary | ICD-10-CM | POA: Diagnosis not present

## 2021-02-12 DIAGNOSIS — O99345 Other mental disorders complicating the puerperium: Secondary | ICD-10-CM | POA: Diagnosis not present

## 2021-02-12 NOTE — Progress Notes (Signed)
Chelsea Collins [id number] Dec 11, 1985 35 y.o.  Virtual Visit via Video Note  I connected with pt @ on 02/26/21 at 10:00 AM EDT by a video enabled telemedicine application and verified that I am speaking with the correct person using two identifiers.   I discussed the limitations of evaluation and management by telemedicine and the availability of in person appointments. The patient expressed understanding and agreed to proceed.  I discussed the assessment and treatment plan with the patient. The patient was provided an opportunity to ask questions and all were answered. The patient agreed with the plan and demonstrated an understanding of the instructions.   The patient was advised to call back or seek an in-person evaluation if the symptoms worsen or if the condition fails to improve as anticipated.  I provided 10 minutes of non-face-to-face time during this encounter.  The patient was located at home.  The provider was located at [facility].   Dorothyann Gibbs, NP   Subjective:   Patient ID:  Chelsea Collins is a 35 y.o. (DOB 02-12-1986) female.  Chief Complaint: No chief complaint on file.   HPI [employer] presents for follow-up of GAD and PPD.  Describes mood today as "ok". Pleasant. Denies tearfulness. Mood symptoms - denies depression and irritability. Feels anxious at times. Is a Chiropractor". Ruminates on things. Gets hyper-focused. Gets distracted at times - "spacing out". Likes things a certain way, but not with items. Likes to know what's going to happen - how things are going to go. If things change wants to have a plan on what's next. Likes to have a plan "for the rest of my life". Has a schedule at work on things to do. Husband feels like she has things play over and over in her head. Stable interest and motivation. Taking medications as prescribed.  Energy levels "good usually". Active, does not have a regular exercise routine.  Enjoys some usual interests and  activities. Married x 4 years. Lives with husband and 59 year old daughter. Family local.Spending time with family. Appetite adequate. Weight stable - 122 pounds - 61". Sleeps well most nights. Averages 6.5 hours during the week. Focus and concentration difficulties - school work - working on [employer] at [employer].  Gets overwhelmed at times and doesn't know where to start. Completing tasks. Managing aspects of household. 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  Denies AH or VH.  Previous medication trials: Trileptal - seizures, Buspar, Mirtazapine,    Review of Systems:  Review of Systems  Musculoskeletal:  Negative for gait problem.  Neurological:  Negative for tremors.  Psychiatric/Behavioral:         Please refer to HPI   Medications: I have reviewed the patient's current medications.  Current Outpatient Medications  Medication Sig Dispense Refill   Oxcarbazepine (TRILEPTAL) 300 MG tablet TAKE 1 TABLET BY MOUTH TWO TIMES DAILY 180 tablet 3   sertraline (ZOLOFT) 50 MG tablet Take 1 tablet (50 mg total) by mouth daily. 30 tablet 2   No current facility-administered medications for this visit.    Medication Side Effects: None  Allergies:  Allergies  Allergen Reactions   Keppra [Levetiracetam] Swelling and Rash   Lamictal [Lamotrigine] Rash   Contrast Media [Iodinated Diagnostic Agents] Cough    MRI contrast NOT CT     Past Medical History:  Diagnosis Date   Anxiety disorder    Encephalomalacia 03/21/2018   Occipital bilateral   GAD (generalized anxiety disorder) 03/21/2018   Heat  intolerance    Hypoglycemia    Meningitis    Postpartum depression 03/01/2019   Seizures (HCC)    as an infant    Family History  Problem Relation Age of Onset   Depression Mother    Miscarriages / Stillbirths Mother    Hypertension Maternal Grandfather    Heart disease Maternal Grandfather    Heart attack Maternal Grandfather    Intellectual disability Brother    Bipolar  disorder Brother    Autism Brother    Diabetes Maternal Grandmother    Cancer Maternal Grandmother     Social History   Socioeconomic History   Marital status: Married    Spouse name: Not on file   Number of children: 0   Years of education: 14   Highest education level: Not on file  Occupational History    Comment: [employer]  Tobacco Use   Smoking status: Never   Smokeless tobacco: Never  Vaping Use   Vaping Use: Never used  Substance and Sexual Activity   Alcohol use: No    Alcohol/week: 0.0 standard drinks   Drug use: No   Sexual activity: Yes    Partners: Male    Birth control/protection: Condom  Other Topics Concern   Not on file  Social History Narrative   Lives with two roommates.     Right-handed.   Two 12oz cans of soda daily.   Social Determinants of Health   Financial Resource Strain: Not on file  Food Insecurity: Not on file  Transportation Needs: Not on file  Physical Activity: Not on file  Stress: Not on file  Social Connections: Not on file  Intimate Partner Violence: Not on file    Past Medical History, Surgical history, Social history, and Family history were reviewed and updated as appropriate.   Please see review of systems for further details on the patient's review from today.   Objective:   Physical Exam:  There were no vitals taken for this visit.  Physical Exam Constitutional:      General: She is not in acute distress. Musculoskeletal:        General: No deformity.  Neurological:     Mental Status: She is alert and oriented to person, place, and time.     Coordination: Coordination normal.  Psychiatric:        Attention and Perception: Attention and perception normal. She does not perceive auditory or visual hallucinations.        Mood and Affect: Mood normal. Mood is not anxious or depressed. Affect is not labile, blunt, angry or inappropriate.        Speech: Speech normal.        Behavior: Behavior normal.        Thought  Content: Thought content normal. Thought content is not paranoid or delusional. Thought content does not include homicidal or suicidal ideation. Thought content does not include homicidal or suicidal plan.        Cognition and Memory: Cognition and memory normal.        Judgment: Judgment normal.     Comments: Insight intact    Lab Review:     Component Value Date/Time   NA 139 03/27/2020 1127   NA 140 12/22/2015 1335   K 3.7 03/27/2020 1127   CL 104 03/27/2020 1127   CO2 26 03/27/2020 1127   GLUCOSE 73 03/27/2020 1127   BUN 12 03/27/2020 1127   BUN 8 12/22/2015 1335   CREATININE 0.65 03/27/2020 1127  CREATININE 0.67 03/02/2019 1506   CALCIUM 9.1 03/27/2020 1127   PROT 6.9 03/27/2020 1127   PROT 6.9 12/22/2015 1335   ALBUMIN 4.4 03/27/2020 1127   ALBUMIN 4.3 12/22/2015 1335   AST 12 03/27/2020 1127   ALT 8 03/27/2020 1127   ALKPHOS 46 03/27/2020 1127   BILITOT 0.3 03/27/2020 1127   BILITOT 0.7 12/22/2015 1335   GFRNONAA 122 12/22/2015 1335   GFRAA 140 12/22/2015 1335       Component Value Date/Time   WBC 4.8 03/27/2020 1127   RBC 4.46 03/27/2020 1127   HGB 12.9 03/27/2020 1127   HGB 13.5 12/22/2015 1335   HCT 38.5 03/27/2020 1127   HCT 40.5 12/22/2015 1335   PLT 259.0 03/27/2020 1127   PLT 343 12/22/2015 1335   MCV 86.4 03/27/2020 1127   MCV 87 12/22/2015 1335   MCH 29.3 03/02/2019 1506   MCHC 33.5 03/27/2020 1127   RDW 13.6 03/27/2020 1127   RDW 13.3 12/22/2015 1335   LYMPHSABS 2.0 03/27/2020 1127   MONOABS 0.3 03/27/2020 1127   EOSABS 0.0 03/27/2020 1127   BASOSABS 0.0 03/27/2020 1127    No results found for: POCLITH, LITHIUM   No results found for: PHENYTOIN, PHENOBARB, VALPROATE, CBMZ   .res Assessment: Plan:    Plan:  PDMP reviewed  1. Zoloft 50mg  daily - take 1/2 tablet daily x 7 days, then increase to one tablet daily.  Time spent with patient was 60 minutes. Greater than 50% of face to face time with patient was spent on counseling and  coordination of care.    RTC 4 weeks  Patient advised to contact office with any questions, adverse effects, or acute worsening in signs and symptoms    Diagnoses and all orders for this visit:  Postpartum depression  Generalized anxiety disorder    Please see After Visit Summary for patient specific instructions.  Future Appointments  Date Time Provider Department Center  04/13/2021  3:30 PM 04/15/2021, MD [facility] None    No orders of the defined types were placed in this encounter.     -------------------------------

## 2021-02-12 NOTE — Progress Notes (Signed)
Chelsea Collins [id number] [birth year] 35 y.o.  Virtual Visit via Video Note  I connected with pt @ on 02/12/21 at 10:00 AM EDT by a video enabled telemedicine application and verified that I am speaking with the correct person using two identifiers.   I discussed the limitations of evaluation and management by telemedicine and the availability of in person appointments. The patient expressed understanding and agreed to proceed.  I discussed the assessment and treatment plan with the patient. The patient was provided an opportunity to ask questions and all were answered. The patient agreed with the plan and demonstrated an understanding of the instructions.   The patient was advised to call back or seek an in-person evaluation if the symptoms worsen or if the condition fails to improve as anticipated.  I provided 10 minutes of non-face-to-face time during this encounter.  The patient was located at home.  The provider was located at [facility].   Dorothyann Gibbs, NP   Subjective:   Patient ID:  Chelsea Collins is a 35 y.o. (DOB [birth year]) female.  Chief Complaint: No chief complaint on file.   HPI [employer] presents for follow-up of GAD and postpartum depression.  Describes mood today as "ok". Pleasant. Denies tearfulness. Mood symptoms - denies depression and irritability. Not feeling as anxious. Decreased worry and rumination. Feels a little distracted - "not like I was prescribing before".  Still likes to have a plan - "just not as worried about it". Not getting as hyper-focused or obsessing as much. Not playing things over and over - it's gotten better". Feels like addition of Zoloft has been helpful. Stable interest and motivation. Taking medications as prescribed.  Energy levels stable. Active, does not have a regular exercise routine.  Enjoys some usual interests and activities. Married x 4 years. Lives with husband and 31 year old daughter. Family local.Spending time  with family. Appetite adequate. Weight stable - 122 pounds - 61". Sleeps well most nights. Averages 6.5 hours during the week. Focus and concentration better. Working on [employer] at [employer] - not feeling as overwhelmed. Completing tasks. Managing aspects of household. 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  Denies AH or VH.  Previous medication trials: Trileptal - seizures, Buspar, Mirtazapine,   Review of Systems:  Review of Systems  Musculoskeletal: Negative for gait problem.  Neurological: Negative for tremors.  Psychiatric/Behavioral:       Please refer to HPI    Medications: I have reviewed the patient's current medications.  Current Outpatient Medications  Medication Sig Dispense Refill  . Oxcarbazepine (TRILEPTAL) 300 MG tablet TAKE 1 TABLET BY MOUTH TWO TIMES DAILY 180 tablet 3  . sertraline (ZOLOFT) 50 MG tablet Take 1 tablet (50 mg total) by mouth daily. 30 tablet 2   No current facility-administered medications for this visit.    Medication Side Effects: None  Allergies:  Allergies  Allergen Reactions  . Keppra [Levetiracetam] Swelling and Rash  . Lamictal [Lamotrigine] Rash  . Contrast Media [Iodinated Diagnostic Agents] Cough    MRI contrast NOT CT     Past Medical History:  Diagnosis Date  . Anxiety disorder   . Encephalomalacia 03/21/2018   Occipital bilateral  . GAD (generalized anxiety disorder) 03/21/2018  . Heat intolerance   . Hypoglycemia   . Meningitis   . Postpartum depression 03/01/2019  . Seizures (HCC)    as an infant    Family History  Problem Relation Age of Onset  . Depression Mother   .  Miscarriages / [place] Mother   . Hypertension Maternal Grandfather   . Heart disease Maternal Grandfather   . Heart attack Maternal Grandfather   . Intellectual disability Brother   . Bipolar disorder Brother   . Autism Brother   . Diabetes Maternal Grandmother   . Cancer Maternal Grandmother     Social History    Socioeconomic History  . Marital status: Married    Spouse name: Not on file  . Number of children: 0  . Years of education: 77  . Highest education level: Not on file  Occupational History    Comment: [employer]  Tobacco Use  . Smoking status: Never Smoker  . Smokeless tobacco: Never Used  Vaping Use  . Vaping Use: Never used  Substance and Sexual Activity  . Alcohol use: No    Alcohol/week: 0.0 standard drinks  . Drug use: No  . Sexual activity: Yes    Partners: Male    Birth control/protection: Condom  Other Topics Concern  . Not on file  Social History Narrative   Lives with two roommates.     Right-handed.   Two 12oz cans of soda daily.   Social Determinants of Health   Financial Resource Strain: Not on file  Food Insecurity: Not on file  Transportation Needs: Not on file  Physical Activity: Not on file  Stress: Not on file  Social Connections: Not on file  Intimate Partner Violence: Not on file    Past Medical History, Surgical history, Social history, and Family history were reviewed and updated as appropriate.   Please see review of systems for further details on the patient's review from today.   Objective:   Physical Exam:  There were no vitals taken for this visit.  Physical Exam Constitutional:      General: She is not in acute distress. Musculoskeletal:        General: No deformity.  Neurological:     Mental Status: She is alert and oriented to person, place, and time.     Coordination: Coordination normal.  Psychiatric:        Attention and Perception: Attention and perception normal. She does not perceive auditory or visual hallucinations.        Mood and Affect: Mood normal. Mood is not anxious or depressed. Affect is not labile, blunt, angry or inappropriate.        Speech: Speech normal.        Behavior: Behavior normal.        Thought Content: Thought content normal. Thought content is not paranoid or delusional. Thought content does not  include homicidal or suicidal ideation. Thought content does not include homicidal or suicidal plan.        Cognition and Memory: Cognition and memory normal.        Judgment: Judgment normal.     Comments: Insight intact     Lab Review:     Component Value Date/Time   NA 139 03/27/2020 1127   NA 140 12/22/2015 1335   K 3.7 03/27/2020 1127   CL 104 03/27/2020 1127   CO2 26 03/27/2020 1127   GLUCOSE 73 03/27/2020 1127   BUN 12 03/27/2020 1127   BUN 8 12/22/2015 1335   CREATININE 0.65 03/27/2020 1127   CREATININE 0.67 03/02/2019 1506   CALCIUM 9.1 03/27/2020 1127   PROT 6.9 03/27/2020 1127   PROT 6.9 12/22/2015 1335   ALBUMIN 4.4 03/27/2020 1127   ALBUMIN 4.3 12/22/2015 1335   AST 12 03/27/2020 1127  ALT 8 03/27/2020 1127   ALKPHOS 46 03/27/2020 1127   BILITOT 0.3 03/27/2020 1127   BILITOT 0.7 12/22/2015 1335   GFRNONAA 122 12/22/2015 1335   GFRAA 140 12/22/2015 1335       Component Value Date/Time   WBC 4.8 03/27/2020 1127   RBC 4.46 03/27/2020 1127   HGB 12.9 03/27/2020 1127   HGB 13.5 12/22/2015 1335   HCT 38.5 03/27/2020 1127   HCT 40.5 12/22/2015 1335   PLT 259.0 03/27/2020 1127   PLT 343 12/22/2015 1335   MCV 86.4 03/27/2020 1127   MCV 87 12/22/2015 1335   MCH 29.3 03/02/2019 1506   MCHC 33.5 03/27/2020 1127   RDW 13.6 03/27/2020 1127   RDW 13.3 12/22/2015 1335   LYMPHSABS 2.0 03/27/2020 1127   MONOABS 0.3 03/27/2020 1127   EOSABS 0.0 03/27/2020 1127   BASOSABS 0.0 03/27/2020 1127    No results found for: POCLITH, LITHIUM   No results found for: PHENYTOIN, PHENOBARB, VALPROATE, CBMZ   .res Assessment: Plan:    Plan:  PDMP reviewed  1. Continue Zoloft 50mg  daily   Time spent with patient was 10 minutes. Greater than 50% of face to face time with patient was spent on counseling and coordination of care.    RTC 4 weeks  Patient advised to contact office with any questions, adverse effects, or acute worsening in signs and  symptoms    Diagnoses and all orders for this visit:  Generalized anxiety disorder  Postpartum depression     Please see After Visit Summary for patient specific instructions.  Future Appointments  Date Time Provider Department Center  04/13/2021  3:30 PM 04/15/2021, MD [facility] None    No orders of the defined types were placed in this encounter.     -------------------------------

## 2021-02-24 DIAGNOSIS — Z01419 Encounter for gynecological examination (general) (routine) without abnormal findings: Secondary | ICD-10-CM | POA: Diagnosis not present

## 2021-02-24 DIAGNOSIS — Z124 Encounter for screening for malignant neoplasm of cervix: Secondary | ICD-10-CM | POA: Diagnosis not present

## 2021-02-24 DIAGNOSIS — Z6821 Body mass index (BMI) 21.0-21.9, adult: Secondary | ICD-10-CM | POA: Diagnosis not present

## 2021-02-24 DIAGNOSIS — Z113 Encounter for screening for infections with a predominantly sexual mode of transmission: Secondary | ICD-10-CM | POA: Diagnosis not present

## 2021-02-26 ENCOUNTER — Encounter: Payer: Self-pay | Admitting: Adult Health

## 2021-03-19 ENCOUNTER — Encounter: Payer: Self-pay | Admitting: Family Medicine

## 2021-03-19 ENCOUNTER — Telehealth (INDEPENDENT_AMBULATORY_CARE_PROVIDER_SITE_OTHER): Payer: BC Managed Care – PPO | Admitting: Family Medicine

## 2021-03-19 VITALS — Ht 61.0 in | Wt 122.0 lb

## 2021-03-19 DIAGNOSIS — J208 Acute bronchitis due to other specified organisms: Secondary | ICD-10-CM | POA: Diagnosis not present

## 2021-03-19 DIAGNOSIS — N393 Stress incontinence (female) (male): Secondary | ICD-10-CM

## 2021-03-19 MED ORDER — BENZONATATE 200 MG PO CAPS: 200.0000 mg | ORAL_CAPSULE | Freq: Two times a day (BID) | ORAL | 0 refills | Status: DC | PRN

## 2021-03-19 NOTE — Progress Notes (Signed)
Virtual Visit via Video Note  Subjective  CC:  Chief Complaint  Patient presents with   Cough    Persisting, productive  Did a  home Covid test with negative results      I connected with Chelsea Collins on 03/19/21 at  9:30 AM EDT by a video enabled telemedicine application and verified that I am speaking with the correct person using two identifiers. Location patient: Home Location provider: [facility] [address] Office Persons participating in the virtual visit: Hollyann Beightol, [name], MD Jolyne Loa CMA  I discussed the limitations of evaluation and management by telemedicine and the availability of in person appointments. The patient expressed understanding and agreed to proceed. HPI: Chelsea Collins is a 35 y.o. female who was contacted today to address the problems listed above in the chief complaint. C/o 2 week URI illness. Both her daughter and husband has same but they have fully recovered. Pt with persistent nagging cough exacerbating her stress incontinence (pelvic floor dysfunction history). No f/c/s, sinus pain, productive cough, chest tightness or wheeze or sob. Eating well. Otc cough meds w/o relief.   Assessment  1. Acute bacterial bronchitis   2. Stress incontinence      Plan  Cough/productive:  prolonged; c/w bronchitis, viral. Tessalon perles and monitor.  Stress incontinence. I discussed the assessment and treatment plan with the patient. The patient was provided an opportunity to ask questions and all were answered. The patient agreed with the plan and demonstrated an understanding of the instructions.   The patient was advised to call back or seek an in-person evaluation if the symptoms worsen or if the condition fails to improve as anticipated. Follow up:  rec appt for cpe.  Visit date not found  No orders of the defined types were placed in this encounter.     I reviewed the patients updated PMH, FH, and SocHx.     Patient Active Problem List   Diagnosis Date Noted   Postpartum depression 03/01/2019   Complex partial seizure (HCC) 03/21/2018   Encephalomalacia 03/21/2018   Anxiety disorder 03/21/2018   Epilepsy (HCC) 01/30/2017   Abnormal MRI of head 10/27/2015   History of bacterial meningitis in infancy 10/27/2015   Current Meds  Medication Sig   Oxcarbazepine (TRILEPTAL) 300 MG tablet TAKE 1 TABLET BY MOUTH TWO TIMES DAILY   sertraline (ZOLOFT) 50 MG tablet Take 1 tablet (50 mg total) by mouth daily.    Allergies: Patient is allergic to keppra [levetiracetam], lamictal [lamotrigine], and contrast media [iodinated diagnostic agents]. Family History: Patient family history includes Autism in her brother; Bipolar disorder in her brother; Cancer in her maternal grandmother; Depression in her mother; Diabetes in her maternal grandmother; Heart attack in her maternal grandfather; Heart disease in her maternal grandfather; Hypertension in her maternal grandfather; Intellectual disability in her brother; Miscarriages / [place] in her mother. Social History:  Patient  reports that she has never smoked. She has never used smokeless tobacco. She reports that she does not drink alcohol and does not use drugs.  Review of Systems: Constitutional: Negative for fever malaise or anorexia Cardiovascular: negative for chest pain Respiratory: negative for SOB or persistent cough Gastrointestinal: negative for abdominal pain  OBJECTIVE Vitals: Ht 5\' 1"  (1.549 m)   Wt 122 lb (55.3 kg)   BMI 23.05 kg/m  General: no acute distress , A&Ox3, appears well. No resp distress  , MD

## 2021-04-13 ENCOUNTER — Telehealth (INDEPENDENT_AMBULATORY_CARE_PROVIDER_SITE_OTHER): Payer: BC Managed Care – PPO | Admitting: Neurology

## 2021-04-13 ENCOUNTER — Encounter: Payer: Self-pay | Admitting: Neurology

## 2021-04-13 ENCOUNTER — Other Ambulatory Visit: Payer: Self-pay

## 2021-04-13 ENCOUNTER — Telehealth: Payer: Self-pay

## 2021-04-13 VITALS — Ht 61.0 in | Wt 120.0 lb

## 2021-04-13 DIAGNOSIS — G40309 Generalized idiopathic epilepsy and epileptic syndromes, not intractable, without status epilepticus: Secondary | ICD-10-CM | POA: Diagnosis not present

## 2021-04-13 MED ORDER — OXCARBAZEPINE 300 MG PO TABS: ORAL_TABLET | Freq: Two times a day (BID) | ORAL | 3 refills | Status: DC

## 2021-04-13 NOTE — Telephone Encounter (Signed)
Dr. Andy, please see message and advise. 

## 2021-04-13 NOTE — Telephone Encounter (Signed)
Patient called in stating that she has an appointment on Friday for a 6 week cough that hasn't gone away. Her husband recently tested positive for TB and they are re-testing him today (no active symptoms), wanting to know if she can still come in and be tested for TB on Friday if he does test positive.

## 2021-04-13 NOTE — Patient Instructions (Signed)
Hope you feel better soon! Continue oxcarbazepine 300mg  twice a day, refills sent. Follow-up in 1 year, call for any changes.   Seizure Precautions: 1. If medication has been prescribed for you to prevent seizures, take it exactly as directed.  Do not stop taking the medicine without talking to your doctor first, even if you have not had a seizure in a long time.   2. Avoid activities in which a seizure would cause danger to yourself or to others.  Don't operate dangerous machinery, swim alone, or climb in high or dangerous places, such as on ladders, roofs, or girders.  Do not drive unless your doctor says you may.  3. If you have any warning that you may have a seizure, lay down in a safe place where you can't hurt yourself.    4.  No driving for 6 months from last seizure, as per [facility]   Please refer to the following link on the Epilepsy Foundation of America's website for more information: http://www.epilepsyfoundation.org/answerplace/Social/driving/drivingu.cfm   5.  Maintain good sleep hygiene. Avoid alcohol.  6.  Notify your neurology if you are planning pregnancy or if you become pregnant.  7.  Contact your doctor if you have any problems that may be related to the medicine you are taking.  8.  Call 911 and bring the patient back to the ED if:        A.  The seizure lasts longer than 5 minutes.       B.  The patient doesn't awaken shortly after the seizure  C.  The patient has new problems such as difficulty seeing, speaking or moving  D.  The patient was injured during the seizure  E.  The patient has a temperature over 102 F (39C)  F.  The patient vomited and now is having trouble breathing

## 2021-04-13 NOTE — Progress Notes (Signed)
Virtual Visit via Video Note The purpose of this virtual visit is to provide medical care while limiting exposure to the novel coronavirus.    Consent was obtained for video visit:  Yes.   Answered questions that patient had about telehealth interaction:  Yes.   I discussed the limitations, risks, security and privacy concerns of performing an evaluation and management service by telemedicine. I also discussed with the patient that there may be a patient responsible charge related to this service. The patient expressed understanding and agreed to proceed.  Pt location: Home Physician Location: office Name of referring provider:  [name], MD I connected with Chelsea Collins at patients initiation/request on 04/13/2021 at  3:30 PM EDT by video enabled telemedicine application and verified that I am speaking with the correct person using two identifiers. Pt MRN:  [id number] Pt DOB:  [birth year] Video Participants:  Chelsea Collins   History of Present Illness:  The patient was seen as a virtual video visit on 04/13/2021. She was last seen in the neurology clinic a year ago for seizures. Since her last visit, she continues to do well with no seizures or seizure-like symptoms since May 2018 on oxcarbazepine 300mg  BID. She denies any staring/unresponsive episodes, gaps in time, olfactory/gustatory hallucinations, focal numbness/tingling/weakness, myoclonic jerks. She had a headache yesterday but does not have headaches regularly. No dizziness, no falls. Sleep is okay. She has been seeing a new psychiatrist and takes sertraline which she feels helps her anxiety. She has seen ENT and been told she has permanent hearing loss on the left ear and has a hearing aid. She was unable to come to the office today due to a persistent cough, she will be seeing her PCP soon. She is currently in grad school, finishing December 2023. No pregnancy plans.   History on Initial Assessment 01/24/2017: This is a pleasant  35 yo RH woman with a history of strep B meningitis in infancy with residual peripheral vision impairment, with  epilepsy. She was diagnosed with epilepsy in childhood when she was having staring spells. She reports that seizures stopped at age 35 and she was taken off seizure medication at age 35. She denies any myoclonic jerks or convulsions. In 2012, she started having episodes where her left leg would become weak for a minute or so, sometimes it felt like her left arm was also affected. She had an MRI brain in [place] reported as showing previous injury to the occipital lobe. She saw a neurologist at that time with concern for MS, she was reassured there was no evidence of MS. In 2014, she was in a classroom and had an episode where she apparently walked toward the wall and stood there just staring and unresponsive. She was amnestic of the event until her supervisor called her to the office and told her what happened. She recalls another episode that same semester, she was stressed and waiting in the lobby, then did not know how she got there. She had an MRI brain without contrast which did not show any acute changes, there was note of ulceration in the calcarine cortex of both hemispheres, left greater than right, increased FLAIR signal going into the deep while matter with decreased signal centrally. Wake and drowsy EEG was normal. When she left school, she reports episodes were not as bad, she was still having difficulties with her left side and started having troubles with anxiety as well. She would be at a store and would not remember a  word or a color. She had an incident in December 2016 while working at [employer] in [place], she was setting up then started feeling sick and nauseated, walked to the back of the store and felt like she could not stand up, vision was blurred. She had to sit on the floor and was pale. When she moved to [place], she started having episodes of feeling hot, nauseated, weird, pale,  as well as zoning out ("walking into traffic"). She saw neurologist Dr. Terrace Arabia and had a repeat EEG which was abnormal, during hyperventilation which was performed twice, there were intermittent short bursts of generalized spike sharp wave, indicating a generalized epilepsy disorder. She was started on Lamotrigine but 5 weeks later had a severe allergic reaction with swollen lymph nodes, night sweats, then rash. She was switched to Keppra and had a more severe immediate reaction within 1-2 days with rash and facial swelling. She was then switched to Trileptal which she tolerated well, and reports that all the symptoms she was having went away. She last saw Dr. Terrace Arabia in February to discuss Trileptal and contraceptives, and made the decision to switch to Vimpat. She presents today asking to be switched back to Trileptal, stating that the Vimpat was not controlling her symptoms. She would have episodes of turning Florea, nausea, vision change, staring off into space unresponsive. She was also having headaches with vomiting. Her psychiatrist was concerned that Remeron was also contributing to symptoms, but she had been taking Remeron with Trileptal without these symptoms.    She also reports episodes where she would have a sudden speech impediment lasting 30 seconds, where she knows what she wants to say but cannot get the words out or form the words. Twice in the past 2 years she has felt a sensation of fear prior to the speech difficulties. She denies any olfactory/gustatory hallucinations, the left-sided symptoms have not recurred since 2014, no myoclonic jerks. She has had headaches every other day when she is late to take her Vimpat dose, resolving after she takes the medication. Pain is around her eyes with some photosensitivity, no nausea/vomiting. She denies any diplopia, dizziness, neck/back pain, bowel/bladder dysfunction.   Epilepsy Risk Factors:  Bacterial meningitis in infancy with encephalomalacia in the  bilateral occipital lobes. Her maternal cousin had seizures in childhood, a maternal uncle had 2 seizures felt to be related to glucose levels. Otherwise she had a normal birth and early development.  There is no history of febrile convulsions, significant traumatic brain injury, neurosurgical procedures.   Prior AEDs: Keppra, Lamotrigine, Trileptal    Current Outpatient Medications on File Prior to Visit  Medication Sig Dispense Refill   benzonatate (TESSALON) 200 MG capsule Take 1 capsule (200 mg total) by mouth 2 (two) times daily as needed for cough. 20 capsule 0   Oxcarbazepine (TRILEPTAL) 300 MG tablet TAKE 1 TABLET BY MOUTH TWO TIMES DAILY 180 tablet 3   sertraline (ZOLOFT) 50 MG tablet Take 1 tablet (50 mg total) by mouth daily. 30 tablet 2   No current facility-administered medications on file prior to visit.     Observations/Objective:   Vitals:   04/13/21 1329  Weight: 120 lb (54.4 kg)  Height: 5\' 1"  (1.549 m)   GEN:  The patient appears stated age and is in NAD.  Neurological examination: Patient is awake, alert. No aphasia or dysarthria. Intact fluency and comprehension. Remote and recent memory intact. Cranial nerves: Extraocular movements intact with no nystagmus. No facial asymmetry. Motor: moves all extremities  symmetrically, at least anti-gravity x 4.    Assessment and Plan:   This is a pleasant 35 yo RH woman with a history of bacterial meningitis in infancy with bilateral occipital encephalomalacia, seizures in childhood with staring spells that initially stopped at age 5 but recurred in 2014. She also started having recurrent episodes of left-sided weakness in 2014, denies any further similar symptoms since then. She reports recurrent episodes where she becomes pale, nauseated, and has had staring spells that stopped after Trileptal, but recurred with switch to Vimpat. EEG showed generalized discharges during hyperventilation suggestive of a generalized epilepsy. She  continues to be seizure-free since May 2018 on oxcarbazepine 300mg  BID without side effects, refills sent. She does not drive. Follow-up in 1 year, call for any changes.    Follow Up Instructions:   -I discussed the assessment and treatment plan with the patient. The patient was provided an opportunity to ask questions and all were answered. The patient agreed with the plan and demonstrated an understanding of the instructions.   The patient was advised to call back or seek an in-person evaluation if the symptoms worsen or if the condition fails to improve as anticipated.     , MD

## 2021-04-15 NOTE — Telephone Encounter (Signed)
Spoke to pt told her Dr. Mardelle Matte will see her wear mask as usual. Pt verbalized understanding and said husband does not have any symptoms and his results are not back yet. Told pt okay, just give Dr. Mardelle Matte update at visit. Pt verbalized understanding.

## 2021-04-17 ENCOUNTER — Ambulatory Visit: Payer: BC Managed Care – PPO | Admitting: Family Medicine

## 2021-04-17 ENCOUNTER — Other Ambulatory Visit: Payer: Self-pay

## 2021-04-17 ENCOUNTER — Encounter: Payer: Self-pay | Admitting: Family Medicine

## 2021-04-17 VITALS — BP 105/71 | HR 72 | Temp 97.8°F | Resp 15 | Wt 122.4 lb

## 2021-04-17 DIAGNOSIS — B9689 Other specified bacterial agents as the cause of diseases classified elsewhere: Secondary | ICD-10-CM | POA: Diagnosis not present

## 2021-04-17 DIAGNOSIS — R059 Cough, unspecified: Secondary | ICD-10-CM

## 2021-04-17 DIAGNOSIS — Z111 Encounter for screening for respiratory tuberculosis: Secondary | ICD-10-CM | POA: Diagnosis not present

## 2021-04-17 DIAGNOSIS — J208 Acute bronchitis due to other specified organisms: Secondary | ICD-10-CM | POA: Diagnosis not present

## 2021-04-17 MED ORDER — PREDNISONE 10 MG PO TABS: ORAL_TABLET | ORAL | 0 refills | Status: DC

## 2021-04-17 MED ORDER — AZITHROMYCIN 250 MG PO TABS: ORAL_TABLET | ORAL | 0 refills | Status: DC

## 2021-04-17 NOTE — Progress Notes (Signed)
Subjective  CC:  Chief Complaint  Patient presents with   Cough    Started last May, productive cough - clear mucus    TB screening    Husband recently had positive PPD for pre-employment screening. Requesting quantiferon be drawn today     HPI: SUBJECTIVE:  Chelsea Collins is a 35 y.o. female who complains of congestion, post nasal drip, cough described as barky, harsh, and productive and denies sinus, high fevers, SOB, chest pain or significant GI symptoms. Symptoms have been present for 1-2 weeks.  Onset correlates with grandmother who is visiting her from out of town, grandmother has same symptoms.  Of note, she was diagnosed with bronchitis back in June.  She reports her symptoms improved significantly although cough never completely resolved.  Also of note, her husband screen for TB for her job.  He has had 2 positive TB skin test.  Chest x-ray is pending.  He has no symptoms.  Patient worries that she could have TB.. She denies a history of anorexia, dizziness, vomiting and wheezing. She denies a history of asthma or COPD. Patient does not smoke cigarettes.  Assessment  1. Acute bacterial bronchitis   2. Cough   3. Screening for tuberculosis      Plan  Discussion:  Treat for bacterial bronchitis due to prolonged course and worsening symptoms. Education regarding differences between viral and bacterial infections and treatment options are discussed.  Supportive care measures are recommended.  We discussed the use of mucolytic's, decongestants, antihistamines and antitussives as needed.  Tylenol or Advil are recommended if needed.  Educated regarding tuberculosis screening test.  We will screen for TB per patient request.  Reassured.  Follow up: As needed.  Orders Placed This Encounter  Procedures   QuantiFERON-TB Gold Plus   Meds ordered this encounter  Medications   azithromycin (ZITHROMAX) 250 MG tablet    Sig: Take 2 tabs today, then 1 tab daily for 4 days    Dispense:   1 each    Refill:  0   predniSONE (DELTASONE) 10 MG tablet    Sig: Take 4 tabs qd x 2 days, 3 qd x 2 days, 2 qd x 2d, 1qd x 3 days    Dispense:  21 tablet    Refill:  0      I reviewed the patients updated PMH, FH, and SocHx.  Social History: Patient  reports that she has never smoked. She has never used smokeless tobacco. She reports that she does not drink alcohol and does not use drugs.  Patient Active Problem List   Diagnosis Date Noted   Postpartum depression 03/01/2019   Complex partial seizure (HCC) 03/21/2018   Encephalomalacia 03/21/2018   Anxiety disorder 03/21/2018   Epilepsy (HCC) 01/30/2017   Abnormal MRI of head 10/27/2015   History of bacterial meningitis in infancy 10/27/2015    Review of Systems: Cardiovascular: negative for chest pain Respiratory: negative for SOB or hemoptysis Gastrointestinal: negative for abdominal pain Genitourinary: negative for dysuria or gross hematuria Current Meds  Medication Sig   azithromycin (ZITHROMAX) 250 MG tablet Take 2 tabs today, then 1 tab daily for 4 days   Oxcarbazepine (TRILEPTAL) 300 MG tablet TAKE 1 TABLET BY MOUTH TWO TIMES DAILY   predniSONE (DELTASONE) 10 MG tablet Take 4 tabs qd x 2 days, 3 qd x 2 days, 2 qd x 2d, 1qd x 3 days   sertraline (ZOLOFT) 50 MG tablet Take 1 tablet (50 mg total) by mouth  daily.   [DISCONTINUED] benzonatate (TESSALON) 200 MG capsule Take 1 capsule (200 mg total) by mouth 2 (two) times daily as needed for cough.    Objective  Vitals: BP 105/71   Pulse 72   Temp 97.8 F (36.6 C) (Temporal)   Resp 15   Wt 122 lb 6.4 oz (55.5 kg)   SpO2 97%   BMI 23.13 kg/m  General: no acute distress, intermittent coughing present Psych:  Alert and oriented, normal mood and affect HEENT:  Normocephalic, atraumatic, supple neck,  supple neck Cardiovascular:  RRR without murmur. no edema Respiratory:  Good breath sounds bilaterally, CTAB with normal respiratory effort with occasional  rhonchi   Commons side effects, risks, benefits, and alternatives for medications and treatment plan prescribed today were discussed, and the patient expressed understanding of the given instructions. Patient is instructed to call or message via MyChart if he/she has any questions or concerns regarding our treatment plan. No barriers to understanding were identified. We discussed Red Flag symptoms and signs in detail. Patient expressed understanding regarding what to do in case of urgent or emergency type symptoms.  Medication list was reconciled, printed and provided to the patient in AVS. Patient instructions and summary information was reviewed with the patient as documented in the AVS. This note was prepared with assistance of Dragon voice recognition software. Occasional wrong-word or sound-a-like substitutions may have occurred due to the inherent limitations of voice recognition software

## 2021-04-17 NOTE — Patient Instructions (Signed)
Please return for your annual complete physical; please come fasting. Your last was a year ago.   Take the antibiotics and prednisone; you may use mucinex DM for your cough.  I'll return your lab results via my chart  If you have any questions or concerns, please don't hesitate to send me a message via MyChart or call the office at [phone]. Thank you for visiting with [place] today! It's our pleasure caring for you.   Tuberculin Skin Test Why am I having this test? The tuberculin skin test is used to check whether a person has been exposed to the bacteria that causes tuberculosis (TB) (Mycobacterium tuberculosis). Tuberculosis is a bacterial infection that usually affects the lungs but can affect other parts of the body. You may have a tuberculin skin test if: You have possible symptoms of TB, such as: Coughing up blood, mucus from the lungs (sputum), or both. A cough that lasts three weeks or longer. Chest pain, or pain while breathing or coughing. Unexplained weight loss. Fatigue and weakness. Fever, sweating, and chills. Loss of appetite. You are at high risk for getting TB. You may be at high risk if you: Inject illegal drugs or share needles. Have HIV or other diseases that affect the body's disease-fighting system (immune system). Work in a health care facility. Live in a high-risk community, such as a homeless shelter, nursing home, or correctional facility. Have had contact with someone who has TB. Are from or have traveled to a country where TB is common. If you are at high risk, you may need to have regular TB screenings. TB screening may be required when starting a new job, such as becoming a Scientist, forensic or a Runner, broadcasting/film/video. Colleges or universities may require TB screening fornew students. What is being tested? This test checks for the presence of TB antibodies in the body. Antibodies are part of the body's immune system. After you get an infection, your body makes antibodies  that stay in your body after you recover and protect you fromgetting the same infection again. Tell a health care provider about: Any allergies you have. All medicines you are taking, including vitamins, herbs, eye drops, creams, and over-the-counter medicines. Any blood disorders you have. Any surgeries you have had. Any medical conditions you have. Whether you are pregnant or may be pregnant. What happens during the test?  Your health care provider will inject a solution called PPD (purified protein derivative) under the first layer of skin on your arm. This causes a small, blister-like bump to form over the area temporarily. PPD is made from the bacteria that causes TB. PPD causes your immune system to react, but it does not get you sickwith TB. You may feel mild stinging as this happens. Afterward, the area may itch orburn. How are the results reported? To get your test results, you will need to see your health care provider again within 2-3 days after you received the injection. It is important to follow your health care provider's instructions about when to be seen again. If you are not seen within 2-3 days, you may need to have the test repeated. At your follow-up visit, your health care provider will measure the area where the PPD was injected to see if the bump has gotten larger due to swelling. Your results will be reported as positive or negative. If the bump has disappeared or is small, your test result is negative. Negative means that you do not have the antibodies. If the bump  is large, your test result is positive. Positive means that you have the antibodies. Swelling is caused by the antibodies reacting with the PPD. The skin may also turn red around the bump. A false-positive result can occur. A false positive is incorrect because itmeans that a condition is present when it is not. A false-negative result can occur. A false negative is incorrect because it means that a condition is  not present when it is. False negatives are rare and are more likely to occur in older people and in people who have weakened immunesystems. What do the results mean? A negative result means that it is unlikely that you have TB or that you have been exposed to TB bacteria. This test may be repeated, or you may have a blood test to check for TB. This is because your body may not react to the tuberculinskin test until several weeks after exposure to TB bacteria. A positive result means that you have been exposed to TB, and you may need more tests to determine if you have: Active TB, also called TB disease. This means that you have TB symptoms and your infection can spread to others (you are contagious). Latent TB. This means that you do not have any symptoms of TB and you are not contagious. Latent TB can turn into active TB. Talk with your health care provider about what your results mean. Questions to ask your health care provider Ask your health care provider, or the department that is doing the test: When will my results be ready? How will I get my results? What are my treatment options? What other tests do I need? What are my next steps? Summary The tuberculin skin test is used to check whether a person has been exposed to the bacteria that causes tuberculosis (TB). Your health care provider will inject a solution known as PPD (purified protein derivative) under the first layer of skin on your arm. After 2-3 days, your health care provider will measure the area where the PPD was injected to see if the bump has gotten larger due to swelling. Your results will be reported as positive or negative. A positive result means that you have been exposed to TB. A negative result means that it is unlikely that you have TB or that you have been exposed to TB bacteria. This information is not intended to replace advice given to you by your health care provider. Make sure you discuss any questions you have  with your healthcare provider. Document Revised: 05/29/2020 Document Reviewed: 05/08/2020 Elsevier Patient Education  2022 [employer].

## 2021-04-20 ENCOUNTER — Encounter: Payer: Self-pay | Admitting: Family Medicine

## 2021-04-20 LAB — QUANTIFERON-TB GOLD PLUS
Mitogen-NIL: >10 IU/mL
NIL: 0.1 IU/mL
QuantiFERON-TB Gold Plus: NEGATIVE
TB1-NIL: <0 IU/mL
TB2-NIL: <0 IU/mL

## 2021-05-21 ENCOUNTER — Ambulatory Visit: Payer: BC Managed Care – PPO | Admitting: Family Medicine

## 2021-06-11 ENCOUNTER — Encounter: Payer: Self-pay | Admitting: Family Medicine

## 2021-06-11 ENCOUNTER — Other Ambulatory Visit: Payer: Self-pay

## 2021-06-11 ENCOUNTER — Ambulatory Visit: Payer: BC Managed Care – PPO | Admitting: Family Medicine

## 2021-06-11 VITALS — BP 110/70 | HR 59 | Temp 98.2°F | Ht 61.0 in | Wt 125.8 lb

## 2021-06-11 DIAGNOSIS — H9190 Unspecified hearing loss, unspecified ear: Secondary | ICD-10-CM | POA: Diagnosis not present

## 2021-06-11 DIAGNOSIS — G9389 Other specified disorders of brain: Secondary | ICD-10-CM | POA: Diagnosis not present

## 2021-06-11 DIAGNOSIS — Z23 Encounter for immunization: Secondary | ICD-10-CM | POA: Diagnosis not present

## 2021-06-11 DIAGNOSIS — G40909 Epilepsy, unspecified, not intractable, without status epilepticus: Secondary | ICD-10-CM

## 2021-06-11 DIAGNOSIS — H547 Unspecified visual loss: Secondary | ICD-10-CM | POA: Diagnosis not present

## 2021-06-11 NOTE — Patient Instructions (Signed)
Please return for your annual complete physical; please come fasting.    If you have any questions or concerns, please don't hesitate to send me a message via MyChart or call the office at [phone]. Thank you for visiting with us today! It's our pleasure caring for you.  

## 2021-06-11 NOTE — Progress Notes (Signed)
Subjective  CC:  Chief Complaint  Patient presents with   Hearing Problem    Transit paperwork    HPI: Chelsea Collins is a 35 y.o. female who presents to the office today to address the problems listed above in the chief complaint. 35 yo with h/o meningitis as infant w/ resultant encephalomalacia and vision/hearing impairment and seizure d/o; cannot drive and uses bus transportation but needs special assistance for safety: limited vision, poor peripheral vision and hearing make using public transportation unsafe. She has paperwork that needs to be completed for services.  Flu shot today   Assessment  1. Hearing loss, unspecified hearing loss type, unspecified laterality   2. Vision impairment   3. Encephalomalacia   4. Nonintractable epilepsy without status epilepticus, unspecified epilepsy type (HCC)      Plan  Chronic vision/hearing brain impairment:  qualifies for special assistance. Forms reviewed and completed.   Follow up: due cpe  Visit date not found  No orders of the defined types were placed in this encounter.  No orders of the defined types were placed in this encounter.     I reviewed the patients updated PMH, FH, and SocHx.    Patient Active Problem List   Diagnosis Date Noted   Vision impairment 06/11/2021   Postpartum depression 03/01/2019   Complex partial seizure (HCC) 03/21/2018   Encephalomalacia 03/21/2018   Anxiety disorder 03/21/2018   Epilepsy (HCC) 01/30/2017   Abnormal MRI of head 10/27/2015   History of bacterial meningitis in infancy 10/27/2015   Current Meds  Medication Sig   Oxcarbazepine (TRILEPTAL) 300 MG tablet TAKE 1 TABLET BY MOUTH TWO TIMES DAILY   sertraline (ZOLOFT) 50 MG tablet Take 1 tablet (50 mg total) by mouth daily.    Allergies: Patient is allergic to keppra [levetiracetam], lamictal [lamotrigine], and contrast media [iodinated diagnostic agents]. Family History: Patient family history includes Autism in her brother;  Bipolar disorder in her brother; Cancer in her maternal grandmother; Depression in her mother; Diabetes in her maternal grandmother; Heart attack in her maternal grandfather; Heart disease in her maternal grandfather; Hypertension in her maternal grandfather; Intellectual disability in her brother; Miscarriages / [place] in her mother. Social History:  Patient  reports that she has never smoked. She has never used smokeless tobacco. She reports that she does not drink alcohol and does not use drugs.  Review of Systems: Constitutional: Negative for fever malaise or anorexia Cardiovascular: negative for chest pain Respiratory: negative for SOB or persistent cough Gastrointestinal: negative for abdominal pain  Objective  Vitals: BP 110/70   Pulse (!) 59   Temp 98.2 F (36.8 C) (Temporal)   Ht 5\' 1"  (1.549 m)   Wt 125 lb 12.8 oz (57.1 kg)   LMP 06/10/2021 (Exact Date)   SpO2 90%   BMI 23.77 kg/m  General: no acute distress , A&Ox3   Commons side effects, risks, benefits, and alternatives for medications and treatment plan prescribed today were discussed, and the patient expressed understanding of the given instructions. Patient is instructed to call or message via MyChart if he/she has any questions or concerns regarding our treatment plan. No barriers to understanding were identified. We discussed Red Flag symptoms and signs in detail. Patient expressed understanding regarding what to do in case of urgent or emergency type symptoms.  Medication list was reconciled, printed and provided to the patient in AVS. Patient instructions and summary information was reviewed with the patient as documented in the AVS. This note was prepared  with assistance of Conservation officer, historic buildings. Occasional wrong-word or sound-a-like substitutions may have occurred due to the inherent limitations of voice recognition software  This visit occurred during the SARS-CoV-2 public health emergency.  Safety  protocols were in place, including screening questions prior to the visit, additional usage of staff PPE, and extensive cleaning of exam room while observing appropriate contact time as indicated for disinfecting solutions.

## 2021-08-06 ENCOUNTER — Other Ambulatory Visit: Payer: Self-pay

## 2021-08-06 ENCOUNTER — Encounter: Payer: Self-pay | Admitting: Family Medicine

## 2021-08-06 ENCOUNTER — Ambulatory Visit (INDEPENDENT_AMBULATORY_CARE_PROVIDER_SITE_OTHER)
Admission: RE | Admit: 2021-08-06 | Discharge: 2021-08-06 | Disposition: A | Discharge status: Home / Self Care | Payer: BC Managed Care – PPO | Source: Ambulatory Visit | Attending: Family Medicine | Admitting: Family Medicine

## 2021-08-06 ENCOUNTER — Ambulatory Visit: Payer: BC Managed Care – PPO | Admitting: Family Medicine

## 2021-08-06 VITALS — BP 122/80 | HR 78 | Temp 98.1°F | Ht 61.0 in | Wt 126.0 lb

## 2021-08-06 DIAGNOSIS — R053 Chronic cough: Secondary | ICD-10-CM | POA: Diagnosis not present

## 2021-08-06 DIAGNOSIS — R0602 Shortness of breath: Secondary | ICD-10-CM | POA: Diagnosis not present

## 2021-08-06 MED ORDER — FLUTICASONE PROPIONATE 50 MCG/ACT NA SUSP: 1.0000 | Freq: Every day | NASAL | 6 refills | Status: DC

## 2021-08-06 MED ORDER — OMEPRAZOLE 20 MG PO CPDR: 20.0000 mg | DELAYED_RELEASE_CAPSULE | Freq: Every day | ORAL | 3 refills | Status: DC

## 2021-08-06 NOTE — Patient Instructions (Signed)
Please return in 6 weeks to recheck cough.   Start OTC zyrtec 10mg  nightly. Use flonase once a day and start omeprazole once a day. These have been ordered.   Please go to our [facility] xray office to get your xrays done. You can walk in M-F between 8:30am- noon or 1pm - 5pm. Tell them you are there for xrays ordered by me. They will send me the results, then I will let you know the results with instructions.   Address: [address]  The Xray department is located in the basement.    If you have any questions or concerns, please don't hesitate to send me a message via MyChart or call the office at [phone]. Thank you for visiting with [phone] today! It's our pleasure caring for you.   Cough, Adult Coughing is a reflex that clears your throat and your airways (respiratory system). Coughing helps to heal and protect your lungs. It is normal to cough occasionally, but a cough that happens with other symptoms or lasts a long time may be a sign of a condition that needs treatment. An acute cough may only last 2-3 weeks, while a chronic cough may last 8 or more weeks. Coughing is commonly caused by: Infection of the respiratory systemby viruses or bacteria. Breathing in substances that irritate your lungs. Allergies. Asthma. Mucus that runs down the back of your throat (postnasal drip). Smoking. Acid backing up from the stomach into the esophagus (gastroesophageal reflux). Certain medicines. Chronic lung problems. Other medical conditions such as heart failure or a blood clot in the lung (pulmonary embolism). Follow these instructions at home: Medicines Take over-the-counter and prescription medicines only as told by your health care provider. Talk with your health care provider before you take a cough suppressant medicine. Lifestyle  Avoid cigarette smoke. Do not use any products that contain nicotine or tobacco, such as cigarettes, e-cigarettes, and chewing tobacco. If  you need help quitting, ask your health care provider. Drink enough fluid to keep your urine pale yellow. Avoid caffeine. Do not drink alcohol if your health care provider tells you not to drink. General instructions  Pay close attention to changes in your cough. Tell your health care provider about them. Always cover your mouth when you cough. Avoid things that make you cough, such as perfume, candles, cleaning products, or campfire or tobacco smoke. If the air is dry, use a cool mist vaporizer or humidifier in your bedroom or your home to help loosen secretions. If your cough is worse at night, try to sleep in a semi-upright position. Rest as needed. Keep all follow-up visits as told by your health care provider. This is important. Contact a health care provider if you: Have new symptoms. Cough up pus. Have a cough that does not get better after 2-3 weeks or gets worse. Cannot control your cough with cough suppressant medicines and you are losing sleep. Have pain that gets worse or pain that is not helped with medicine. Have a fever. Have unexplained weight loss. Have night sweats. Get help right away if: You cough up blood. You have difficulty breathing. Your heartbeat is very fast. These symptoms may represent a serious problem that is an emergency. Do not wait to see if the symptoms will go away. Get medical help right away. Call your local emergency services (911 in the [place] Do not drive yourself to the hospital. Summary Coughing is a reflex that clears your throat and your airways. It  is normal to cough occasionally, but a cough that happens with other symptoms or lasts a long time may be a sign of a condition that needs treatment. Take over-the-counter and prescription medicines only as told by your health care provider. Always cover your mouth when you cough. Contact a health care provider if you have new symptoms or a cough that does not get better after 2-3 weeks or gets  worse. This information is not intended to replace advice given to you by your health care provider. Make sure you discuss any questions you have with your health care provider. Document Revised: 09/25/2018 Document Reviewed: 09/25/2018 Elsevier Patient Education  2022 [employer].

## 2021-08-06 NOTE — Progress Notes (Signed)
Subjective  CC:  Chief Complaint  Patient presents with   Cough    Ongoing off and on since February    HPI: Chelsea Collins is a 35 y.o. female who presents to the office today to address the problems listed above in the chief complaint. 35 year old presents due to chronic intermittent coughing spells.  She did have 2 bouts of bronchitis, 1 in June and 1 in late August early September.  She was treated both times with antibiotics.  We use prednisone in September.  She did improve significantly, however she continues to have intermittent coughing spells.  She reports at times cough is productive of clear sputum.  She denies sinus symptoms, postnasal drainage.  Sometimes she thinks cough is worse after eating.  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  She denies shortness of breath or tightness in the chest.  No history of wheezing or allergies.  No fevers or chills.  She denies odynaphasia or significant GERD symptoms.  No fevers or chills.  No chronic sinus pain or chronic sinusitis.  She has no GI symptoms.  Energy levels are good.  Assessment  1. Chronic cough      Plan  Chronic cough: Likely multifactorial with allergies, PND and/or reflux.  We will start Zyrtec, Flonase and omeprazole and recheck in 6 weeks.  Education given.  Recommend wearing a mask when she goes to the mall room.  Check chest x-ray to ensure no chronic lung diseases.  Differential is broad.  Clinical exam is normal today.  Follow up: Return in about 6 weeks (around 09/17/2021) for recheck.  Visit date not found  Orders Placed This Encounter  Procedures   DG Chest 2 View    Meds ordered this encounter  Medications   fluticasone (FLONASE) 50 MCG/ACT nasal spray    Sig: Place 1 spray into both nostrils daily.    Dispense:  16 g    Refill:  6   omeprazole (PRILOSEC) 20 MG capsule    Sig: Take 1 capsule (20 mg total) by mouth daily.    Dispense:  30 capsule     Refill:  3       I reviewed the patients updated PMH, FH, and SocHx.    Patient Active Problem List   Diagnosis Date Noted   Vision impairment 06/11/2021   Postpartum depression 03/01/2019   Complex partial seizure (HCC) 03/21/2018   Encephalomalacia 03/21/2018   Anxiety disorder 03/21/2018   Epilepsy (HCC) 01/30/2017   Abnormal MRI of head 10/27/2015   History of bacterial meningitis in infancy 10/27/2015   Current Meds  Medication Sig   fluticasone (FLONASE) 50 MCG/ACT nasal spray Place 1 spray into both nostrils daily.   omeprazole (PRILOSEC) 20 MG capsule Take 1 capsule (20 mg total) by mouth daily.   Oxcarbazepine (TRILEPTAL) 300 MG tablet TAKE 1 TABLET BY MOUTH TWO TIMES DAILY   sertraline (ZOLOFT) 50 MG tablet Take 1 tablet (50 mg total) by mouth daily.    Allergies: Patient is allergic to keppra [levetiracetam], lamictal [lamotrigine], and contrast media [iodinated diagnostic agents]. Family History: Patient family history includes Autism in her brother; Bipolar disorder in her brother; Cancer in her maternal grandmother; Depression in her mother; Diabetes in her maternal grandmother; Heart attack in her maternal grandfather; Heart disease in her maternal grandfather; Hypertension in her maternal grandfather; Intellectual disability in her brother; Miscarriages / [place] in her mother. Social History:  Patient  reports  that she has never smoked. She has never used smokeless tobacco. She reports that she does not drink alcohol and does not use drugs.  Review of Systems: Constitutional: Negative for fever malaise or anorexia Cardiovascular: negative for chest pain Respiratory: negative for SOB or wheezing Gastrointestinal: negative for abdominal pain  Objective  Vitals: BP 122/80   Pulse 78   Temp 98.1 F (36.7 C) (Temporal)   Ht 5\' 1"  (1.549 m)   Wt 126 lb (57.2 kg)   SpO2 98%   BMI 23.81 kg/m  General: no acute distress , A&Ox3, harsh cough present  intermittently. HEENT: PEERL, conjunctiva normal, neck is supple, TMs normal bilaterally, oropharynx clear, no lymphadenopathy Cardiovascular:  RRR without murmur or gallop.  Respiratory:  Good breath sounds bilaterally, CTAB with normal respiratory effort, no wheezing Soft nontender abdomen Skin:  Warm, no rashes    Commons side effects, risks, benefits, and alternatives for medications and treatment plan prescribed today were discussed, and the patient expressed understanding of the given instructions. Patient is instructed to call or message via MyChart if he/she has any questions or concerns regarding our treatment plan. No barriers to understanding were identified. We discussed Red Flag symptoms and signs in detail. Patient expressed understanding regarding what to do in case of urgent or emergency type symptoms.  Medication list was reconciled, printed and provided to the patient in AVS. Patient instructions and summary information was reviewed with the patient as documented in the AVS. This note was prepared with assistance of Dragon voice recognition software. Occasional wrong-word or sound-a-like substitutions may have occurred due to the inherent limitations of voice recognition software  This visit occurred during the SARS-CoV-2 public health emergency.  Safety protocols were in place, including screening questions prior to the visit, additional usage of staff PPE, and extensive cleaning of exam room while observing appropriate contact time as indicated for disinfecting solutions.

## 2021-09-24 ENCOUNTER — Telehealth (INDEPENDENT_AMBULATORY_CARE_PROVIDER_SITE_OTHER): Payer: BC Managed Care – PPO | Admitting: Family Medicine

## 2021-09-24 ENCOUNTER — Encounter: Payer: Self-pay | Admitting: Family Medicine

## 2021-09-24 ENCOUNTER — Ambulatory Visit: Payer: BC Managed Care – PPO | Admitting: Family Medicine

## 2021-09-24 DIAGNOSIS — U071 COVID-19: Secondary | ICD-10-CM

## 2021-09-24 DIAGNOSIS — R053 Chronic cough: Secondary | ICD-10-CM | POA: Diagnosis not present

## 2021-09-24 DIAGNOSIS — R1110 Vomiting, unspecified: Secondary | ICD-10-CM | POA: Diagnosis not present

## 2021-09-24 MED ORDER — BENZONATATE 200 MG PO CAPS: 200.0000 mg | ORAL_CAPSULE | Freq: Two times a day (BID) | ORAL | 0 refills | Status: DC | PRN

## 2021-09-24 MED ORDER — PROMETHAZINE-DM 6.25-15 MG/5ML PO SYRP: 5.0000 mL | ORAL_SOLUTION | Freq: Four times a day (QID) | ORAL | 0 refills | Status: DC | PRN

## 2021-09-24 NOTE — Progress Notes (Signed)
Virtual Visit via Video Note  Subjective  CC:  Chief Complaint  Patient presents with   Cough    Tested positive for covid on 1/1   Nausea   Nasal Congestion   Generalized Body Aches     I connected with Mane Myles on 09/24/21 at  9:30 AM EST by a video enabled telemedicine application and verified that I am speaking with the correct person using two identifiers. Location patient: Home Location provider: [facility] [place], Office Persons participating in the virtual visit: Elveria Courtright, Leamon Arnt, MD Tyna Jaksch CMA  I discussed the limitations of evaluation and management by telemedicine and the availability of in person appointments. The patient expressed understanding and agreed to proceed. HPI: Chelsea Collins is a 36 y.o. female who was contacted today to address the problems listed above in the chief complaint. Covid x 5-6 days: has persistent cough but no sob, no fevers, no gi sxs. Taking mucinex. Daughter had same but did very well. Having post-tussive emesis. No myalgias and energy level has improved. Has had 2 covid vaccines; no boosters.  Chronic cough: see November visit: added PPI and allergy meds: doing better overall prior to covid; also notes worse sxs in her sunroom. Cxr was normal.   Assessment  1. COVID-19   2. Chronic cough   3. Post-tussive emesis      Plan  Covid 19:  education given. Cough syrup recommended. Supportive care tessalon and phenergan DM. Add decongestant.  Chronic cough: pt get mold test for sunroom. Continue ppi and allergy meds. Improving.  I discussed the assessment and treatment plan with the patient. The patient was provided an opportunity to ask questions and all were answered. The patient agreed with the plan and demonstrated an understanding of the instructions.   The patient was advised to call back or seek an in-person evaluation if the symptoms worsen or if the condition fails to improve as  anticipated. Follow up: July cpe   I reviewed the patients updated PMH, FH, and SocHx.    Patient Active Problem List   Diagnosis Date Noted   Chronic cough 09/24/2021   Vision impairment 06/11/2021   Postpartum depression 03/01/2019   Complex partial seizure ([place]) 03/21/2018   Encephalomalacia 03/21/2018   Anxiety disorder 03/21/2018   Epilepsy ([place]) 01/30/2017   Abnormal MRI of head 10/27/2015   History of bacterial meningitis in infancy 10/27/2015   Current Meds  Medication Sig   benzonatate (TESSALON) 200 MG capsule Take 1 capsule (200 mg total) by mouth 2 (two) times daily as needed for cough.   fluticasone (FLONASE) 50 MCG/ACT nasal spray Place 1 spray into both nostrils daily.   omeprazole (PRILOSEC) 20 MG capsule Take 1 capsule (20 mg total) by mouth daily.   Oxcarbazepine (TRILEPTAL) 300 MG tablet TAKE 1 TABLET BY MOUTH TWO TIMES DAILY   promethazine-dextromethorphan (PROMETHAZINE-DM) 6.25-15 MG/5ML syrup Take 5 mLs by mouth 4 (four) times daily as needed for cough.   sertraline (ZOLOFT) 50 MG tablet Take 1 tablet (50 mg total) by mouth daily.    Allergies: Patient is allergic to keppra [levetiracetam], lamictal [lamotrigine], and contrast media [iodinated contrast media]. Family History: Patient family history includes Autism in her brother; Bipolar disorder in her brother; Cancer in her maternal grandmother; Depression in her mother; Diabetes in her maternal grandmother; Heart attack in her maternal grandfather; Heart disease in her maternal grandfather; Hypertension in her maternal grandfather; Intellectual disability in her  brother; Miscarriages / [place] in her mother. Social History:  Patient  reports that she has never smoked. She has never used smokeless tobacco. She reports that she does not drink alcohol and does not use drugs.  Review of Systems: Constitutional: Negative for fever malaise or anorexia Cardiovascular: negative for chest pain Respiratory:  negative for SOB  Gastrointestinal: negative for abdominal pain  OBJECTIVE Vitals: There were no vitals taken for this visit. General: no acute distress , A&Ox3 Hacking cough present. No respiratory distress  Leamon Arnt, MD

## 2021-10-16 ENCOUNTER — Ambulatory Visit: Payer: BC Managed Care – PPO | Admitting: Family Medicine

## 2021-10-16 ENCOUNTER — Other Ambulatory Visit: Payer: Self-pay

## 2021-10-16 ENCOUNTER — Encounter: Payer: Self-pay | Admitting: Family Medicine

## 2021-10-16 VITALS — BP 120/70 | HR 83 | Temp 98.1°F | Ht 61.0 in | Wt 124.1 lb

## 2021-10-16 DIAGNOSIS — H9312 Tinnitus, left ear: Secondary | ICD-10-CM

## 2021-10-16 DIAGNOSIS — R42 Dizziness and giddiness: Secondary | ICD-10-CM

## 2021-10-16 NOTE — Patient Instructions (Signed)
Dizziness.  Referring to ENT.  Worse, new symptoms, let me know

## 2021-10-16 NOTE — Progress Notes (Signed)
Subjective:     Patient ID: Chelsea Collins, female    DOB: [birth year], 36 y.o.   MRN: [id number]  Chief Complaint  Patient presents with   Hearing Problem    Hearing lost in left ear about a year ago, having some loud ringing in ear, had some dizziness, off and on Need new referral to ENT    HPI Dizziness(off balance)-intermitt for few days. Tinnitis worse. Can be just sitting doing nothing.  Off balance if occurs while walking.   Tinnitis some better but not to balance.  Some mild nausea. No HA recently. No f/c.  URI few wks-month ago(covid). No dble vision Chronic hearing loss L ear and tinnitis. Has seen ENT in past but retiring(Dr. Ezzard Standing).  Chronic eye deviation.  Hasn't missed any doses of meds.    Health Maintenance Due  Topic Date Due   COVID-19 Vaccine (3 - Booster for Moderna series) 12/21/2019    Past Medical History:  Diagnosis Date   Anxiety disorder    Encephalomalacia 03/21/2018   Occipital bilateral   GAD (generalized anxiety disorder) 03/21/2018   Heat intolerance    Hypoglycemia    Meningitis    Postpartum depression 03/01/2019   Seizures ([facility])    as an infant    Past Surgical History:  Procedure Laterality Date   EYE SURGERY     TYMPANOSTOMY TUBE PLACEMENT      Outpatient Medications Prior to Visit  Medication Sig Dispense Refill   Oxcarbazepine (TRILEPTAL) 300 MG tablet TAKE 1 TABLET BY MOUTH TWO TIMES DAILY 180 tablet 3   sertraline (ZOLOFT) 50 MG tablet Take 1 tablet (50 mg total) by mouth daily. 30 tablet 2   omeprazole (PRILOSEC) 20 MG capsule Take 1 capsule (20 mg total) by mouth daily. 30 capsule 3   fluticasone (FLONASE) 50 MCG/ACT nasal spray Place 1 spray into both nostrils daily. (Patient not taking: Reported on 10/16/2021) 16 g 6   benzonatate (TESSALON) 200 MG capsule Take 1 capsule (200 mg total) by mouth 2 (two) times daily as needed for cough. (Patient not taking: Reported on 10/16/2021) 20 capsule 0   promethazine-dextromethorphan  (PROMETHAZINE-DM) 6.25-15 MG/5ML syrup Take 5 mLs by mouth 4 (four) times daily as needed for cough. (Patient not taking: Reported on 10/16/2021) 118 mL 0   No facility-administered medications prior to visit.    Allergies  Allergen Reactions   Keppra [Levetiracetam] Swelling and Rash   Lamictal [Lamotrigine] Rash   Contrast Media [Iodinated Contrast Media] Cough    MRI contrast NOT CT    XVQ:[id number] except as noted in HPI      Objective:     BP 120/70    Pulse 83    Temp 98.1 F (36.7 C) (Temporal)    Ht 5\' 1"  (1.549 m)    Wt 124 lb 2 oz (56.3 kg)    LMP 10/03/2021 (Approximate)    SpO2 99%    BMI 23.45 kg/m  Wt Readings from Last 3 Encounters:  10/16/21 124 lb 2 oz (56.3 kg)  08/06/21 126 lb (57.2 kg)  06/11/21 125 lb 12.8 oz (57.1 kg)        Gen: WDWN NAD WF HEENT: NCAT, conjunctiva not injected, sclera nonicteric. EOMI. L eye chronically deviates inward.  TM WNL B, OP moist, no exudates  NECK:  supple, no thyromegaly, no nodes, no carotid bruits CARDIAC: RRR, S1S2+, no murmur. DP 2+B LUNGS: CTAB. No wheezes EXT:  no edema MSK: no gross abnormalities.  NEURO: A&O  x3.  CN II-XII intact. F-N, RAM,pronator drift, romberg, tandem gait normal PSYCH: normal mood. Good eye contact  Assessment & Plan:   Problem List Items Addressed This Visit   None Visit Diagnoses     Dizziness    -  Primary   Relevant Orders   Ambulatory referral to ENT   Tinnitus of left ear       Relevant Orders   Ambulatory referral to ENT      Dizziness/tinnitis worse.  Refer ENT. Can try Epley.    No orders of the defined types were placed in this encounter.   Angelena Sole, MD

## 2021-10-20 ENCOUNTER — Telehealth: Payer: Self-pay

## 2021-10-20 NOTE — Telephone Encounter (Signed)
Patient called in stating she was contacted by the ENT we referred her to after seeing Dr. Ruthine Dose on 1/27.  States the office was not able to get her in soon.  States she did call them and was placed on hold, so she is not sure if they were able to find her a sooner appt.  States she has to have treatment for the condition within 2 weeks of symptom onset in order to not continue loosing hearing.    I informed patient that we could try referring her over to [facility]).  Patient wanted to know if [employer] was in network.  I informed patient that she could follow up with her insurance to verify.  Patient than began to curse at me.  Stated that it is our fault that she lost her hearing a previous time.   Patient was not calming down and continued to curse.  I informed patient I would have to disconnect and disconnected.

## 2021-10-21 NOTE — Telephone Encounter (Signed)
See note

## 2021-11-03 ENCOUNTER — Encounter: Payer: Self-pay | Admitting: Family Medicine

## 2021-11-05 ENCOUNTER — Encounter: Payer: Self-pay | Admitting: Family Medicine

## 2021-11-05 ENCOUNTER — Other Ambulatory Visit: Payer: Self-pay

## 2021-11-05 ENCOUNTER — Ambulatory Visit: Payer: BC Managed Care – PPO | Admitting: Family Medicine

## 2021-11-05 VITALS — BP 102/50 | HR 54 | Temp 98.4°F | Ht 61.0 in | Wt 123.8 lb

## 2021-11-05 DIAGNOSIS — R42 Dizziness and giddiness: Secondary | ICD-10-CM

## 2021-11-05 MED ORDER — MECLIZINE HCL 25 MG PO TABS: 25.0000 mg | ORAL_TABLET | Freq: Three times a day (TID) | ORAL | 1 refills | Status: AC | PRN

## 2021-11-05 NOTE — Progress Notes (Signed)
Subjective:     Patient ID: Chelsea Collins, female    DOB: 10-Aug-1986, 36 y.o.   MRN: [id number]  Chief Complaint  Patient presents with   Dizziness    On 2/14, Felt like everything was spinning for 2 hours and unsteady on her feet. Laying down helps sometimes.     HPI Dizziness.  Now, 2/14-episode of room spinning for 2 hrs and unsteady on feet. Nausea. Did some epley and some better but still ongoing for 2 hrs just less intense Today, a little dizzy still, but much better.  Chronic hearing loss L.  No HA when the vertigo episode occurred 2 days ago.  Mild HA  yesterday but no dizzy. No f/c, speech slur,weakness.  ENT-end of April.  Former ENT mentioned Mineir's dz.  Seen by myself end of Jan-dizziness continued but lasted 1-2 minutes and not bad.  Health Maintenance Due  Topic Date Due   COVID-19 Vaccine (3 - Booster for Moderna series) 12/21/2019    Past Medical History:  Diagnosis Date   Anxiety disorder    Encephalomalacia 03/21/2018   Occipital bilateral   GAD (generalized anxiety disorder) 03/21/2018   Heat intolerance    Hypoglycemia    Meningitis    Postpartum depression 03/01/2019   Seizures ([place])    as an infant    Past Surgical History:  Procedure Laterality Date   EYE SURGERY     TYMPANOSTOMY TUBE PLACEMENT      Outpatient Medications Prior to Visit  Medication Sig Dispense Refill   Oxcarbazepine (TRILEPTAL) 300 MG tablet TAKE 1 TABLET BY MOUTH TWO TIMES DAILY 180 tablet 3   sertraline (ZOLOFT) 50 MG tablet Take 1 tablet (50 mg total) by mouth daily. 30 tablet 2   fluticasone (FLONASE) 50 MCG/ACT nasal spray Place 1 spray into both nostrils daily. (Patient not taking: Reported on 11/05/2021) 16 g 6   No facility-administered medications prior to visit.    Allergies  Allergen Reactions   Keppra [Levetiracetam] Swelling and Rash   Lamictal [Lamotrigine] Rash   Contrast Media [Iodinated Contrast Media] Cough    MRI contrast NOT CT     [id number] except as noted in HPI      Objective:     BP (!) 102/50    Pulse (!) 54    Temp 98.4 F (36.9 C) (Temporal)    Ht 5\' 1"  (1.549 m)    Wt 123 lb 12.8 oz (56.2 kg)    LMP 10/28/2021    SpO2 97%    BMI 23.39 kg/m  Wt Readings from Last 3 Encounters:  11/05/21 123 lb 12.8 oz (56.2 kg)  10/16/21 124 lb 2 oz (56.3 kg)  08/06/21 126 lb (57.2 kg)        Gen: WDWN NAD WF HEENT: NCAT, conjunctiva not injected, sclera nonicteric. EOMI. ? Few beats nystagmus L. L eye chronically deviates inward.  TM WNL B, OP moist, no exudates  NECK:  supple, no thyromegaly, no nodes, no carotid bruits CARDIAC: RRR, S1S2+, no murmur. DP 2+B EXT:  no edema MSK: no gross abnormalities.  NEURO: A&O x3.  CN II-XII intact. F-N, RAM,pronator drift, normal PSYCH: normal mood. Good eye contact  Assessment & Plan:   Problem List Items Addressed This Visit   None Visit Diagnoses     Vertigo    -  Primary      Vertigo-better.  Antivert.  Decrease salt.  Pt will try to get w/ENT sooner F/u neuro.  Declines MRI for  now Do Epley.    Meds ordered this encounter  Medications   meclizine (ANTIVERT) 25 MG tablet    Sig: Take 1 tablet (25 mg total) by mouth 3 (three) times daily as needed for dizziness.    Dispense:  30 tablet    Refill:  1    Wellington Hampshire, MD

## 2021-11-05 NOTE — Patient Instructions (Signed)
It was very nice to see you today!  Call around for ENT  Call neurologist if continues Do the Epley Maneuvers Take the antivert   PLEASE NOTE:  If you had any lab tests please let us know if you have not heard back within a few days. You may see your results on MyChart before we have a chance to review them but we will give you a call once they are reviewed by [place]. If we ordered any referrals today, please let us know if you have not heard from their office within the next week.   Please try these tips to maintain a healthy lifestyle:  Eat most of your calories during the day when you are active. Eliminate processed foods including packaged sweets (pies, cakes, cookies), reduce intake of potatoes, Jeschke bread, Feldhaus pasta, and Cothron rice. Look for whole grain options, oat flour or almond flour.  Each meal should contain half fruits/vegetables, one quarter protein, and one quarter carbs (no bigger than a computer mouse).  Cut down on sweet beverages. This includes juice, soda, and sweet tea. Also watch fruit intake, though this is a healthier sweet option, it still contains natural sugar! Limit to 3 servings daily.  Drink at least 1 glass of water with each meal and aim for at least 8 glasses per day  Exercise at least 150 minutes every week.

## 2021-12-22 DIAGNOSIS — H9313 Tinnitus, bilateral: Secondary | ICD-10-CM | POA: Insufficient documentation

## 2021-12-22 DIAGNOSIS — H8102 Meniere's disease, left ear: Secondary | ICD-10-CM | POA: Diagnosis not present

## 2021-12-22 DIAGNOSIS — H903 Sensorineural hearing loss, bilateral: Secondary | ICD-10-CM | POA: Insufficient documentation

## 2022-02-11 DIAGNOSIS — Z713 Dietary counseling and surveillance: Secondary | ICD-10-CM | POA: Diagnosis not present

## 2022-02-23 DIAGNOSIS — H8102 Meniere's disease, left ear: Secondary | ICD-10-CM | POA: Diagnosis not present

## 2022-02-23 DIAGNOSIS — H9042 Sensorineural hearing loss, unilateral, left ear, with unrestricted hearing on the contralateral side: Secondary | ICD-10-CM | POA: Diagnosis not present

## 2022-02-24 DIAGNOSIS — H903 Sensorineural hearing loss, bilateral: Secondary | ICD-10-CM | POA: Diagnosis not present

## 2022-02-24 DIAGNOSIS — H9313 Tinnitus, bilateral: Secondary | ICD-10-CM | POA: Diagnosis not present

## 2022-02-24 DIAGNOSIS — H8102 Meniere's disease, left ear: Secondary | ICD-10-CM | POA: Diagnosis not present

## 2022-03-15 ENCOUNTER — Ambulatory Visit: Payer: BC Managed Care – PPO | Admitting: Family Medicine

## 2022-03-15 ENCOUNTER — Encounter: Payer: Self-pay | Admitting: Family Medicine

## 2022-03-15 VITALS — BP 110/80 | HR 87 | Temp 98.4°F | Ht 61.0 in | Wt 120.4 lb

## 2022-03-15 DIAGNOSIS — H8102 Meniere's disease, left ear: Secondary | ICD-10-CM | POA: Diagnosis not present

## 2022-03-15 DIAGNOSIS — J069 Acute upper respiratory infection, unspecified: Secondary | ICD-10-CM | POA: Diagnosis not present

## 2022-03-15 DIAGNOSIS — Z803 Family history of malignant neoplasm of breast: Secondary | ICD-10-CM | POA: Diagnosis not present

## 2022-03-15 DIAGNOSIS — G40909 Epilepsy, unspecified, not intractable, without status epilepticus: Secondary | ICD-10-CM | POA: Diagnosis not present

## 2022-03-30 DIAGNOSIS — Z713 Dietary counseling and surveillance: Secondary | ICD-10-CM | POA: Diagnosis not present

## 2022-04-14 ENCOUNTER — Encounter: Payer: Self-pay | Admitting: Neurology

## 2022-04-14 ENCOUNTER — Telehealth (INDEPENDENT_AMBULATORY_CARE_PROVIDER_SITE_OTHER): Payer: BC Managed Care – PPO | Admitting: Neurology

## 2022-04-14 VITALS — Ht 61.0 in | Wt 124.0 lb

## 2022-04-14 DIAGNOSIS — G40309 Generalized idiopathic epilepsy and epileptic syndromes, not intractable, without status epilepticus: Secondary | ICD-10-CM

## 2022-04-14 MED ORDER — OXCARBAZEPINE 300 MG PO TABS: ORAL_TABLET | Freq: Two times a day (BID) | ORAL | 3 refills | Status: DC

## 2022-04-14 NOTE — Progress Notes (Signed)
Virtual Visit via Video Note The purpose of this virtual visit is to provide medical care while limiting exposure to the novel coronavirus.    Consent was obtained for video visit:  Yes.   Answered questions that patient had about telehealth interaction:  Yes.   I discussed the limitations, risks, security and privacy concerns of performing an evaluation and management service by telemedicine. I also discussed with the patient that there may be a patient responsible charge related to this service. The patient expressed understanding and agreed to proceed.  Pt location: Home Physician Location: office Name of referring provider:  [name], MD I connected with Chelsea Collins at patients initiation/request on 04/14/2022 at 10:00 AM EDT by video enabled telemedicine application and verified that I am speaking with the correct person using two identifiers. Pt MRN:  [id number] Pt DOB:  March 03, 1986 Video Participants:  Chelsea Collins   History of Present Illness:  The patient had a virtual video visit on 04/14/2022. She was last seen a year ago for seizures. She continues to do well seizure-free since May 2018 on oxcarbazepine 300mg  BID. She denies any staring/unresponsive episodes, gaps in time, olfactory/gustatory hallucinations, focal numbness/tingling/weakness, myoclonic jerks. No significant headaches. She was recently diagnosed with Meniere's disease when she started having significant attacks of vertigo lasting 1-2 hours with nausea/vomiting. She has seen a nutritionist and had good response by lowering salt intake. She had only 1 attack in the past 4 months triggered by a large amount of salt intake. She had a few falls from the dizziness due to Meniere's, but this has quieted down. She has not been sleeping well recently, waking up earlier than she hoped for, but thinks this may be school-related.    History on Initial Assessment 01/24/2017: This is a pleasant 36 yo RH woman with a history  of strep B meningitis in infancy with residual peripheral vision impairment, with  epilepsy. She was diagnosed with epilepsy in childhood when she was having staring spells. She reports that seizures stopped at age 12 and she was taken off seizure medication at age 36. She denies any myoclonic jerks or convulsions. In 2012, she started having episodes where her left leg would become weak for a minute or so, sometimes it felt like her left arm was also affected. She had an MRI brain in [place] reported as showing previous injury to the occipital lobe. She saw a neurologist at that time with concern for MS, she was reassured there was no evidence of MS. In 2014, she was in a classroom and had an episode where she apparently walked toward the wall and stood there just staring and unresponsive. She was amnestic of the event until her supervisor called her to the office and told her what happened. She recalls another episode that same semester, she was stressed and waiting in the lobby, then did not know how she got there. She had an MRI brain without contrast which did not show any acute changes, there was note of ulceration in the calcarine cortex of both hemispheres, left greater than right, increased FLAIR signal going into the deep while matter with decreased signal centrally. Wake and drowsy EEG was normal. When she left school, she reports episodes were not as bad, she was still having difficulties with her left side and started having troubles with anxiety as well. She would be at a store and would not remember a word or a color. 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 She had to sit on the floor and was pale. When she moved to [place], she started having episodes of feeling hot, nauseated, weird, pale, as well as zoning out  ("walking into traffic"). She saw neurologist Dr. Terrace Arabia and had a repeat EEG which was abnormal, during hyperventilation which was performed twice, there were intermittent short bursts of generalized spike sharp wave, indicating a generalized epilepsy disorder. She was started on Lamotrigine but 5 weeks later had a severe allergic reaction with swollen lymph nodes, night sweats, then rash. She was switched to Keppra and had a more severe immediate reaction within 1-2 days with rash and facial swelling. She was then switched to Trileptal which she tolerated well, and reports that all the symptoms she was having went away. She last saw Dr. Terrace Arabia in February to discuss Trileptal and contraceptives, and made the decision to switch to Vimpat. She presents today asking to be switched back to Trileptal, stating that the Vimpat was not controlling her symptoms. She would have episodes of turning [place], nausea, vision change, staring off into space unresponsive. She was also having headaches with vomiting. Her psychiatrist was concerned that Remeron was also contributing to symptoms, but she had been taking Remeron with Trileptal without these symptoms.    She also reports episodes where she would have a sudden speech impediment lasting 30 seconds, where she knows what she wants to say but cannot get the words out or form the words. Twice in the past 2 years she has felt a sensation of fear prior to the speech difficulties. She denies any olfactory/gustatory hallucinations, the left-sided symptoms have not recurred since 2014, no myoclonic jerks. She has had headaches every other day when she is late to take her Vimpat dose, resolving after she takes the medication. Pain is around her eyes with some photosensitivity, no nausea/vomiting. She denies any diplopia, dizziness, neck/back pain, bowel/bladder dysfunction.   Epilepsy Risk Factors:  Bacterial meningitis in infancy with encephalomalacia in the bilateral occipital  lobes. Her maternal cousin had seizures in childhood, a maternal uncle had 2 seizures felt to be related to glucose levels. Otherwise she had a normal birth and early development.  There is no history of febrile convulsions, significant traumatic brain injury, neurosurgical procedures.   Prior AEDs: Keppra, Lamotrigine, Trileptal   Current Outpatient Medications on File Prior to Visit  Medication Sig Dispense Refill   meclizine (ANTIVERT) 25 MG tablet Take 1 tablet (25 mg total) by mouth 3 (three) times daily as needed for dizziness. 30 tablet 1   Oxcarbazepine (TRILEPTAL) 300 MG tablet TAKE 1 TABLET BY MOUTH TWO TIMES DAILY 180 tablet 3   fluticasone (FLONASE) 50 MCG/ACT nasal spray Place 1 spray into both nostrils daily. (Patient not taking: Reported on 04/14/2022) 16 g 6   sertraline (ZOLOFT) 50 MG tablet Take 1 tablet (50 mg total) by mouth daily. (Patient not taking: Reported on 04/14/2022) 30 tablet 2   No current facility-administered medications on file prior to visit.     Observations/Objective:   Vitals:   04/14/22 0839  Weight: 124 lb (56.2 kg)  Height: 5\' 1"  (1.549 m)   GEN:  The patient appears stated age and is in NAD.  Neurological examination: Patient is awake, alert. No aphasia or dysarthria. Intact fluency and comprehension. Cranial nerves: chronic left  eye abduction and left esotropia with intact extraocular movements. No facial asymmetry. Motor: moves all extremities symmetrically, at least anti-gravity x 4.    Assessment and Plan:   This is a pleasant 36 yo RH woman with a history of bacterial meningitis in infancy with bilateral occipital encephalomalacia, seizures in childhood with staring spells that initially stopped at age 79 but recurred in 2014. She also started having recurrent episodes of left-sided weakness in 2014 that had quieted down. She had recurrent episodes of becoming pale, nauseated, with staring spells that stopped after initiation of oxcarbazepine.  EEG showed generalized discharges during hyperventilation suggestive of a generalized epilepsy. She is doing well on oxcarbazepine 300mg  BID, seizure free since May 2018 refills sent. She does not drive. Follow-up in 1 year, call for any changes.    Follow Up Instructions:   -I discussed the assessment and treatment plan with the patient. The patient was provided an opportunity to ask questions and all were answered. The patient agreed with the plan and demonstrated an understanding of the instructions.   The patient was advised to call back or seek an in-person evaluation if the symptoms worsen or if the condition fails to improve as anticipated.    June 2018, MD  CC: Dr. Van Clines

## 2022-04-14 NOTE — Patient Instructions (Signed)
Good to see you. Continue oxcarbazepine 300mg  twice a day. Follow-up in 1 year, call for any changes.    Seizure Precautions: 1. If medication has been prescribed for you to prevent seizures, take it exactly as directed.  Do not stop taking the medicine without talking to your doctor first, even if you have not had a seizure in a long time.   2. Avoid activities in which a seizure would cause danger to yourself or to others.  Don't operate dangerous machinery, swim alone, or climb in high or dangerous places, such as on ladders, roofs, or girders.  Do not drive unless your doctor says you may.  3. If you have any warning that you may have a seizure, lay down in a safe place where you can't hurt yourself.    4.  No driving for 6 months from last seizure, as per [facility].   Please refer to the following link on the Epilepsy Foundation of America's website for more information: http://www.epilepsyfoundation.org/answerplace/Social/driving/drivingu.cfm   5.  Maintain good sleep hygiene. Avoid alcohol.  6.  Notify your neurology if you are planning pregnancy or if you become pregnant.  7.  Contact your doctor if you have any problems that may be related to the medicine you are taking.  8.  Call 911 and bring the patient back to the ED if:        A.  The seizure lasts longer than 5 minutes.       B.  The patient doesn't awaken shortly after the seizure  C.  The patient has new problems such as difficulty seeing, speaking or moving  D.  The patient was injured during the seizure  E.  The patient has a temperature over 102 F (39C)  F.  The patient vomited and now is having trouble breathing

## 2022-04-19 ENCOUNTER — Encounter: Payer: Self-pay | Admitting: Adult Health

## 2022-04-19 ENCOUNTER — Ambulatory Visit: Payer: BC Managed Care – PPO | Admitting: Adult Health

## 2022-04-19 DIAGNOSIS — F411 Generalized anxiety disorder: Secondary | ICD-10-CM | POA: Diagnosis not present

## 2022-04-19 MED ORDER — SERTRALINE HCL 50 MG PO TABS: 50.0000 mg | ORAL_TABLET | Freq: Every day | ORAL | 2 refills | Status: DC

## 2022-04-19 NOTE — Progress Notes (Signed)
Chelsea Collins [id number] [birth year] 36 y.o.  Subjective:   Patient ID:  Chelsea Collins is a 36 y.o. (DOB Mar 01, 1986) female.  Chief Complaint: No chief complaint on file.   HPI Chelsea Collins presents to the office today for follow-up of GAD and PPD.  Describes mood today as "ok". Pleasant. Denies tearfulness. Mood symptoms - denies depression and irritability.  Feels more anxious overall - "waking up anxious". Reports worry and rumination. Reports irrational thoughts. Gets hyper-focused on things. Gets distracted at times. Likes to have a plan - "maybe not to the same extent as I did". Reporting conflict with mother in law over past few months. Feels like the Zoloft was helpful to manage mood symptoms, but did not take consistently. Would like to restart medication. Stable interest and motivation. Taking medications as prescribed.  Energy levels "good usually". Active, does not have a regular exercise routine.  Enjoys some usual interests and activities. Married. Lives with husband and 85 year old daughter. Family local. Spending time with family. Appetite adequate - low sodium diet. Weight stable - 124 pounds - 61". Sleeps well most nights. Averages 6.5 hours during the week - waking up early and then goes back to sleep. Focus and concentration has gotten "better" in general - school work - working on Scientist, water quality at [employer]. Completing tasks. Managing aspects of household. 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  Denies AH or VH. Denies self harm. Denies substance use.   Diagnosed with Meniere's disease.  Previous medication trials: Trileptal - seizures, Buspar, Mirtazapine,     GAD-7    Flowsheet Row Office Visit from 08/28/2018 in [facility]  Total GAD-7 Score 1      PHQ2-9    Flowsheet Row Office Visit from 06/11/2021 in [facility] Visit from 11/23/2019 in [facility] from 08/28/2018 in [facility] Office Visit from 03/21/2018 in [facility]  PHQ-2 Total Score 0 0 0 0  PHQ-9 Total Score -- -- 1 0        Review of Systems:  Review of Systems  Musculoskeletal:  Negative for gait problem.  Neurological:  Negative for tremors.  Psychiatric/Behavioral:         Please refer to HPI    Medications: I have reviewed the patient's current medications.  Current Outpatient Medications  Medication Sig Dispense Refill   fluticasone (FLONASE) 50 MCG/ACT nasal spray Place 1 spray into both nostrils daily. (Patient not taking: Reported on 04/14/2022) 16 g 6   meclizine (ANTIVERT) 25 MG tablet Take 1 tablet (25 mg total) by mouth 3 (three) times daily as needed for dizziness. 30 tablet 1   Oxcarbazepine (TRILEPTAL) 300 MG tablet TAKE 1 TABLET BY MOUTH TWO TIMES DAILY 180 tablet 3   sertraline (ZOLOFT) 50 MG tablet Take 1 tablet (50 mg total) by mouth daily. 30 tablet 2   No current facility-administered medications for this visit.    Medication Side Effects: None  Allergies:  Allergies  Allergen Reactions   Keppra [Levetiracetam] Swelling and Rash   Lamictal [Lamotrigine] Rash   Contrast Media [Iodinated Contrast Media] Cough    MRI contrast NOT CT     Past Medical History:  Diagnosis Date   Anxiety disorder    Encephalomalacia 03/21/2018   Occipital bilateral   GAD (generalized anxiety disorder) 03/21/2018   Heat intolerance    Hypoglycemia  Meningitis    Postpartum depression 03/01/2019   Seizures (HCC)    as an infant    Past Medical History, Surgical history, Social history, and Family history were reviewed and updated as appropriate.   Please see review of systems for further details on the patient's review from today.   Objective:   Physical Exam:  There were no vitals taken for this visit.  Physical  Exam Constitutional:      General: She is not in acute distress. Musculoskeletal:        General: No deformity.  Neurological:     Mental Status: She is alert and oriented to person, place, and time.     Coordination: Coordination normal.  Psychiatric:        Attention and Perception: Attention and perception normal. She does not perceive auditory or visual hallucinations.        Mood and Affect: Mood normal. Mood is not anxious or depressed. Affect is not labile, blunt, angry or inappropriate.        Speech: Speech normal.        Behavior: Behavior normal.        Thought Content: Thought content normal. Thought content is not paranoid or delusional. Thought content does not include homicidal or suicidal ideation. Thought content does not include homicidal or suicidal plan.        Cognition and Memory: Cognition and memory normal.        Judgment: Judgment normal.     Comments: Insight intact     Lab Review:     Component Value Date/Time   NA 139 03/27/2020 1127   NA 140 12/22/2015 1335   K 3.7 03/27/2020 1127   CL 104 03/27/2020 1127   CO2 26 03/27/2020 1127   GLUCOSE 73 03/27/2020 1127   BUN 12 03/27/2020 1127   BUN 8 12/22/2015 1335   CREATININE 0.65 03/27/2020 1127   CREATININE 0.67 03/02/2019 1506   CALCIUM 9.1 03/27/2020 1127   PROT 6.9 03/27/2020 1127   PROT 6.9 12/22/2015 1335   ALBUMIN 4.4 03/27/2020 1127   ALBUMIN 4.3 12/22/2015 1335   AST 12 03/27/2020 1127   ALT 8 03/27/2020 1127   ALKPHOS 46 03/27/2020 1127   BILITOT 0.3 03/27/2020 1127   BILITOT 0.7 12/22/2015 1335   GFRNONAA 122 12/22/2015 1335   GFRAA 140 12/22/2015 1335       Component Value Date/Time   WBC 4.8 03/27/2020 1127   RBC 4.46 03/27/2020 1127   HGB 12.9 03/27/2020 1127   HGB 13.5 12/22/2015 1335   HCT 38.5 03/27/2020 1127   HCT 40.5 12/22/2015 1335   PLT 259.0 03/27/2020 1127   PLT 343 12/22/2015 1335   MCV 86.4 03/27/2020 1127   MCV 87 12/22/2015 1335   MCH 29.3 03/02/2019 1506    MCHC 33.5 03/27/2020 1127   RDW 13.6 03/27/2020 1127   RDW 13.3 12/22/2015 1335   LYMPHSABS 2.0 03/27/2020 1127   MONOABS 0.3 03/27/2020 1127   EOSABS 0.0 03/27/2020 1127   BASOSABS 0.0 03/27/2020 1127    No results found for: "POCLITH", "LITHIUM"   No results found for: "PHENYTOIN", "PHENOBARB", "VALPROATE", "CBMZ"   .res Assessment: Plan:    Plan:  PDMP reviewed  1. Zoloft 50mg  daily - take 1/2 tablet daily x 7 days, then increase to one tablet daily.  Time spent with patient was 25 minutes. Greater than 50% of face to face time with patient was spent on counseling and coordination of care.  RTC 4 weeks  Patient advised to contact office with any questions, adverse effects, or acute worsening in signs and symptoms  Diagnoses and all orders for this visit:  Generalized anxiety disorder -     sertraline (ZOLOFT) 50 MG tablet; Take 1 tablet (50 mg total) by mouth daily.     Please see After Visit Summary for patient specific instructions.  Future Appointments  Date Time Provider Department Center  07/26/2022 10:30 AM [name], MD [facility]  04/15/2023 10:00 AM Van Clines, MD [facility] None    No orders of the defined types were placed in this encounter.   -------------------------------

## 2022-04-20 DIAGNOSIS — Z713 Dietary counseling and surveillance: Secondary | ICD-10-CM | POA: Diagnosis not present

## 2022-05-10 DIAGNOSIS — Z713 Dietary counseling and surveillance: Secondary | ICD-10-CM | POA: Diagnosis not present

## 2022-05-17 ENCOUNTER — Encounter: Payer: Self-pay | Admitting: Adult Health

## 2022-05-17 ENCOUNTER — Telehealth (INDEPENDENT_AMBULATORY_CARE_PROVIDER_SITE_OTHER): Payer: BC Managed Care – PPO | Admitting: Adult Health

## 2022-05-17 DIAGNOSIS — F411 Generalized anxiety disorder: Secondary | ICD-10-CM | POA: Diagnosis not present

## 2022-05-17 DIAGNOSIS — F53 Postpartum depression: Secondary | ICD-10-CM

## 2022-05-17 MED ORDER — SERTRALINE HCL 50 MG PO TABS: 50.0000 mg | ORAL_TABLET | Freq: Every day | ORAL | 1 refills | Status: DC

## 2022-05-17 NOTE — Progress Notes (Signed)
Chelsea Collins [id number] [birth year] 36 y.o.  Subjective:   Patient ID:  Chelsea Collins is a 36 y.o. (DOB Sep 10, 1986) female.  Chief Complaint: No chief complaint on file.   HPI Chelsea Collins presents to the office today for follow-up of GAD and PPD.  Describes mood today as "ok". Pleasant. Denies tearfulness. Mood symptoms - denies depression and irritability.  Reports decreased anxiety.  Reports decreased worry and rumination. Decreased irrational thoughts. Not as "hyper" focused on things - uncertain if this will be a good thing. Mood is consistent. Reporting conflict with mother in law. Feels like the addition of Zoloft is helpful to manage mood symptoms. Stable interest and motivation. Taking medications as prescribed.  Energy levels varies with Meniere's disease. Active, does not have a regular exercise routine.  Enjoys some usual interests and activities. Married. Lives with husband and daughter. Family local. Spending time with family. Appetite adequate. Weight stable - 124 pounds - 61". Sleeps well most nights. Averages 7 hours.  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 Completing tasks. Managing aspects of household. 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  Denies AH or VH. Denies self harm. Denies substance use.   Diagnosed with Meniere's disease.  Previous medication trials: Trileptal - seizures, Buspar, Mirtazapine,    GAD-7    Flowsheet Row Office Visit from 08/28/2018 in [facility]  Total GAD-7 Score 1      PHQ2-9    Flowsheet Row Office Visit from 06/11/2021 in [facility] Visit from 11/23/2019 in [facility] from 08/28/2018 in [facility] Office Visit from 03/21/2018 in [facility]   PHQ-2 Total Score 0 0 0 0  PHQ-9 Total Score -- -- 1 0        Review of Systems:  Review of Systems  Musculoskeletal:  Negative for gait problem.  Neurological:  Negative for tremors.  Psychiatric/Behavioral:         Please refer to HPI    Medications: I have reviewed the patient's current medications.  Current Outpatient Medications  Medication Sig Dispense Refill   fluticasone (FLONASE) 50 MCG/ACT nasal spray Place 1 spray into both nostrils daily. (Patient not taking: Reported on 04/14/2022) 16 g 6   meclizine (ANTIVERT) 25 MG tablet Take 1 tablet (25 mg total) by mouth 3 (three) times daily as needed for dizziness. 30 tablet 1   Oxcarbazepine (TRILEPTAL) 300 MG tablet TAKE 1 TABLET BY MOUTH TWO TIMES DAILY 180 tablet 3   sertraline (ZOLOFT) 50 MG tablet Take 1 tablet (50 mg total) by mouth daily. 90 tablet 1   No current facility-administered medications for this visit.    Medication Side Effects: None  Allergies:  Allergies  Allergen Reactions   Keppra [Levetiracetam] Swelling and Rash   Lamictal [Lamotrigine] Rash   Contrast Media [Iodinated Contrast Media] Cough    MRI contrast NOT CT     Past Medical History:  Diagnosis Date   Anxiety disorder    Encephalomalacia 03/21/2018   Occipital bilateral   GAD (generalized anxiety disorder) 03/21/2018   Heat intolerance    Hypoglycemia    Meningitis    Postpartum depression 03/01/2019   Seizures (HCC)    as an infant    Past Medical History, Surgical history, Social history, and Family history were reviewed and updated as appropriate.  Please see review of systems for further details on the patient's review from today.   Objective:   Physical Exam:  There were no vitals taken for this visit.  Physical Exam Constitutional:      General: She is not in acute distress. Musculoskeletal:        General: No deformity.  Neurological:     Mental Status: She is alert and oriented to person, place, and time.      Coordination: Coordination normal.  Psychiatric:        Attention and Perception: Attention and perception normal. She does not perceive auditory or visual hallucinations.        Mood and Affect: Mood normal. Mood is not anxious or depressed. Affect is not labile, blunt, angry or inappropriate.        Speech: Speech normal.        Behavior: Behavior normal.        Thought Content: Thought content normal. Thought content is not paranoid or delusional. Thought content does not include homicidal or suicidal ideation. Thought content does not include homicidal or suicidal plan.        Cognition and Memory: Cognition and memory normal.        Judgment: Judgment normal.     Comments: Insight intact     Lab Review:     Component Value Date/Time   NA 139 03/27/2020 1127   NA 140 12/22/2015 1335   K 3.7 03/27/2020 1127   CL 104 03/27/2020 1127   CO2 26 03/27/2020 1127   GLUCOSE 73 03/27/2020 1127   BUN 12 03/27/2020 1127   BUN 8 12/22/2015 1335   CREATININE 0.65 03/27/2020 1127   CREATININE 0.67 03/02/2019 1506   CALCIUM 9.1 03/27/2020 1127   PROT 6.9 03/27/2020 1127   PROT 6.9 12/22/2015 1335   ALBUMIN 4.4 03/27/2020 1127   ALBUMIN 4.3 12/22/2015 1335   AST 12 03/27/2020 1127   ALT 8 03/27/2020 1127   ALKPHOS 46 03/27/2020 1127   BILITOT 0.3 03/27/2020 1127   BILITOT 0.7 12/22/2015 1335   GFRNONAA 122 12/22/2015 1335   GFRAA 140 12/22/2015 1335       Component Value Date/Time   WBC 4.8 03/27/2020 1127   RBC 4.46 03/27/2020 1127   HGB 12.9 03/27/2020 1127   HGB 13.5 12/22/2015 1335   HCT 38.5 03/27/2020 1127   HCT 40.5 12/22/2015 1335   PLT 259.0 03/27/2020 1127   PLT 343 12/22/2015 1335   MCV 86.4 03/27/2020 1127   MCV 87 12/22/2015 1335   MCH 29.3 03/02/2019 1506   MCHC 33.5 03/27/2020 1127   RDW 13.6 03/27/2020 1127   RDW 13.3 12/22/2015 1335   LYMPHSABS 2.0 03/27/2020 1127   MONOABS 0.3 03/27/2020 1127   EOSABS 0.0 03/27/2020 1127   BASOSABS 0.0 03/27/2020 1127     No results found for: "POCLITH", "LITHIUM"   No results found for: "PHENYTOIN", "PHENOBARB", "VALPROATE", "CBMZ"   .res Assessment: Plan:    Plan:  PDMP reviewed  1. Zoloft 50mg  daily  Time spent with patient was 20 minutes. Greater than 50% of face to face time with patient was spent on counseling and coordination of care.    RTC 3 months  Patient advised to contact office with any questions, adverse effects, or acute worsening in signs and symptoms.   Diagnoses and all orders for this visit:  Generalized anxiety disorder -     sertraline (ZOLOFT) 50 MG tablet; Take 1 tablet (50 mg total) by  mouth daily.  Postpartum depression     Please see After Visit Summary for patient specific instructions.  Future Appointments  Date Time Provider Department Center  07/26/2022 10:30 AM [name], MD [facility]  04/15/2023 10:00 AM Van Clines, MD [facility] None    No orders of the defined types were placed in this encounter.   -------------------------------

## 2022-06-14 ENCOUNTER — Encounter: Payer: Self-pay | Admitting: *Deleted

## 2022-06-14 DIAGNOSIS — Z713 Dietary counseling and surveillance: Secondary | ICD-10-CM | POA: Diagnosis not present

## 2022-06-28 DIAGNOSIS — Z713 Dietary counseling and surveillance: Secondary | ICD-10-CM | POA: Diagnosis not present

## 2022-07-20 DIAGNOSIS — Z713 Dietary counseling and surveillance: Secondary | ICD-10-CM | POA: Diagnosis not present

## 2022-07-26 ENCOUNTER — Encounter: Payer: BC Managed Care – PPO | Admitting: Family Medicine

## 2022-08-17 DIAGNOSIS — F411 Generalized anxiety disorder: Secondary | ICD-10-CM | POA: Diagnosis not present

## 2022-08-17 DIAGNOSIS — F902 Attention-deficit hyperactivity disorder, combined type: Secondary | ICD-10-CM | POA: Diagnosis not present

## 2022-08-26 DIAGNOSIS — Z713 Dietary counseling and surveillance: Secondary | ICD-10-CM | POA: Diagnosis not present

## 2022-09-02 ENCOUNTER — Encounter: Payer: Self-pay | Admitting: *Deleted

## 2022-09-24 ENCOUNTER — Encounter: Payer: Self-pay | Admitting: Neurology

## 2022-09-27 DIAGNOSIS — Z713 Dietary counseling and surveillance: Secondary | ICD-10-CM | POA: Diagnosis not present

## 2022-10-06 IMAGING — DX DG CHEST 2V
2 series · 2 of 2 positions shown · non-contrast
Comparison: None.

CLINICAL DATA: Chronic cough, congestion and shortness of breath on
exertion intermittently for 3-4 months.

EXAM:
CHEST - 2 VIEW

[chest pa]
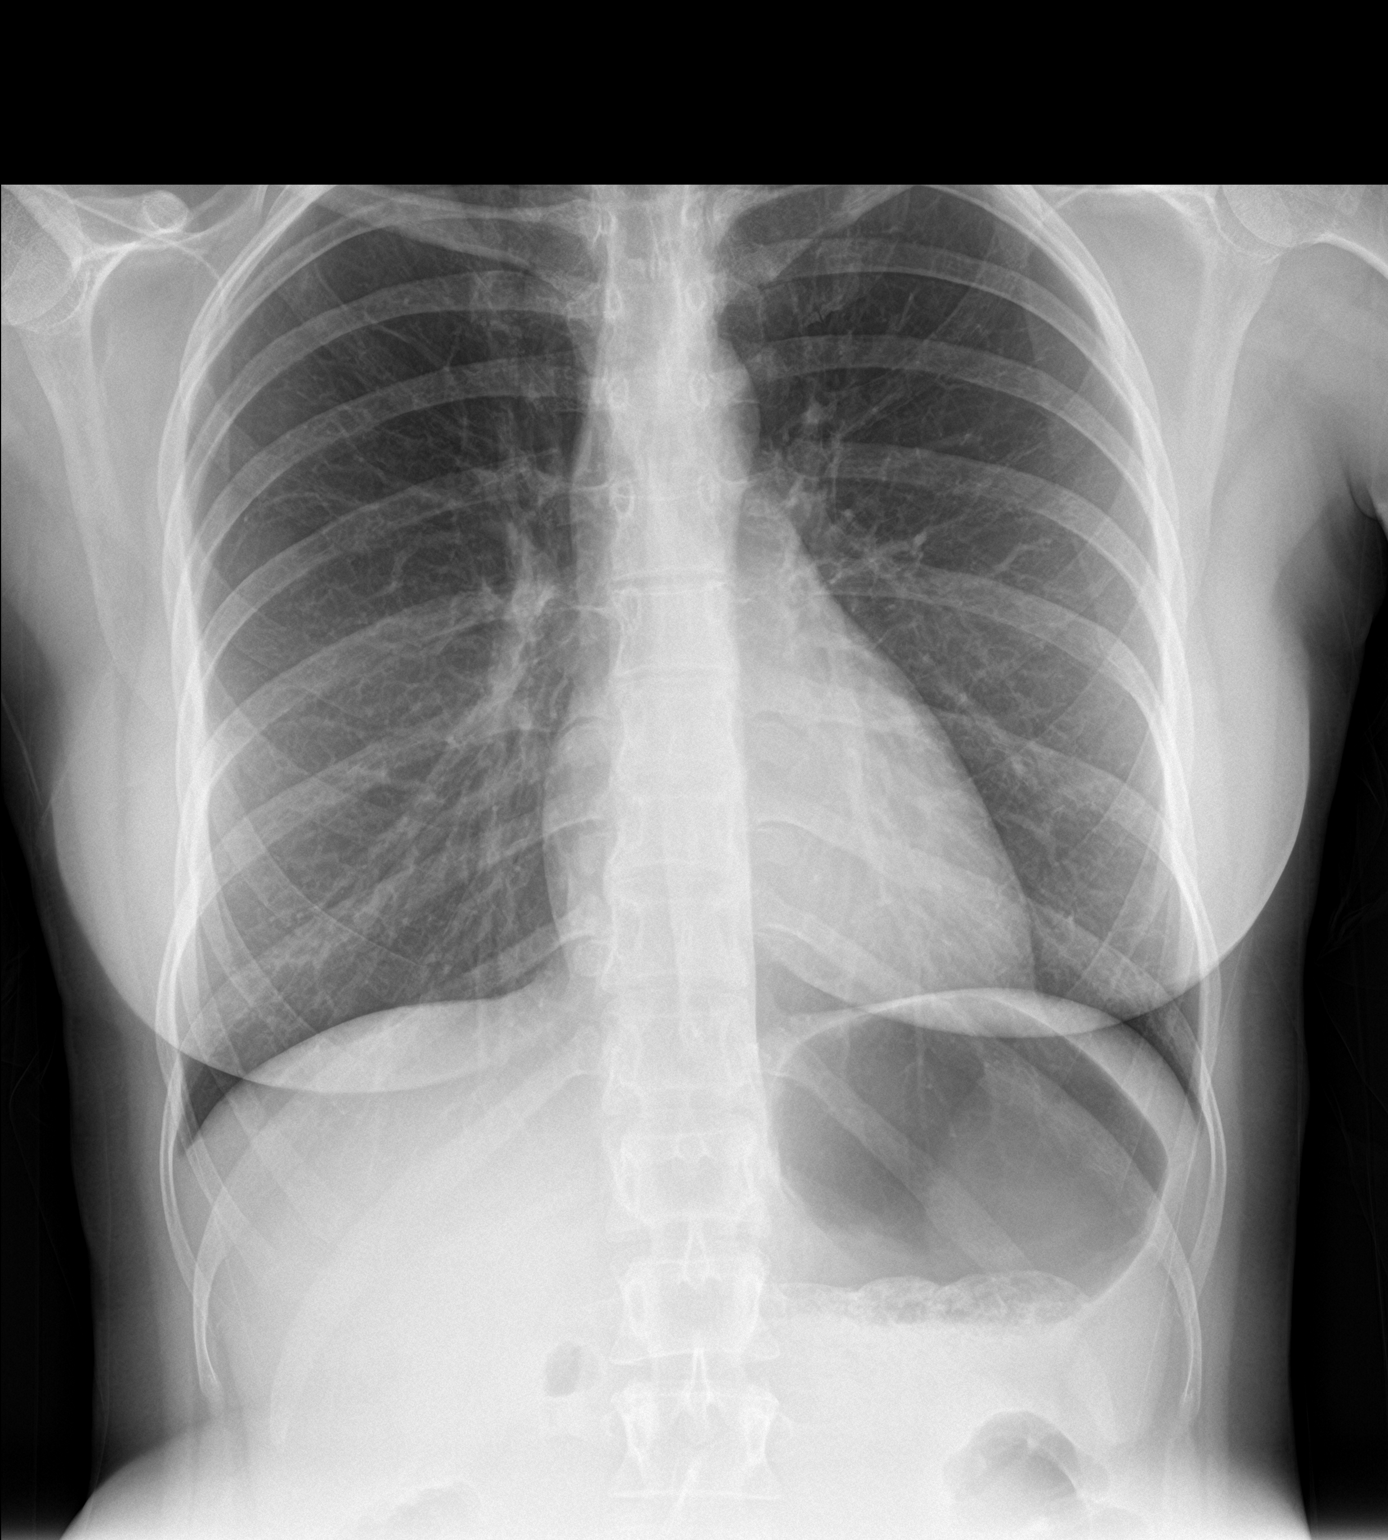

[chest lat]
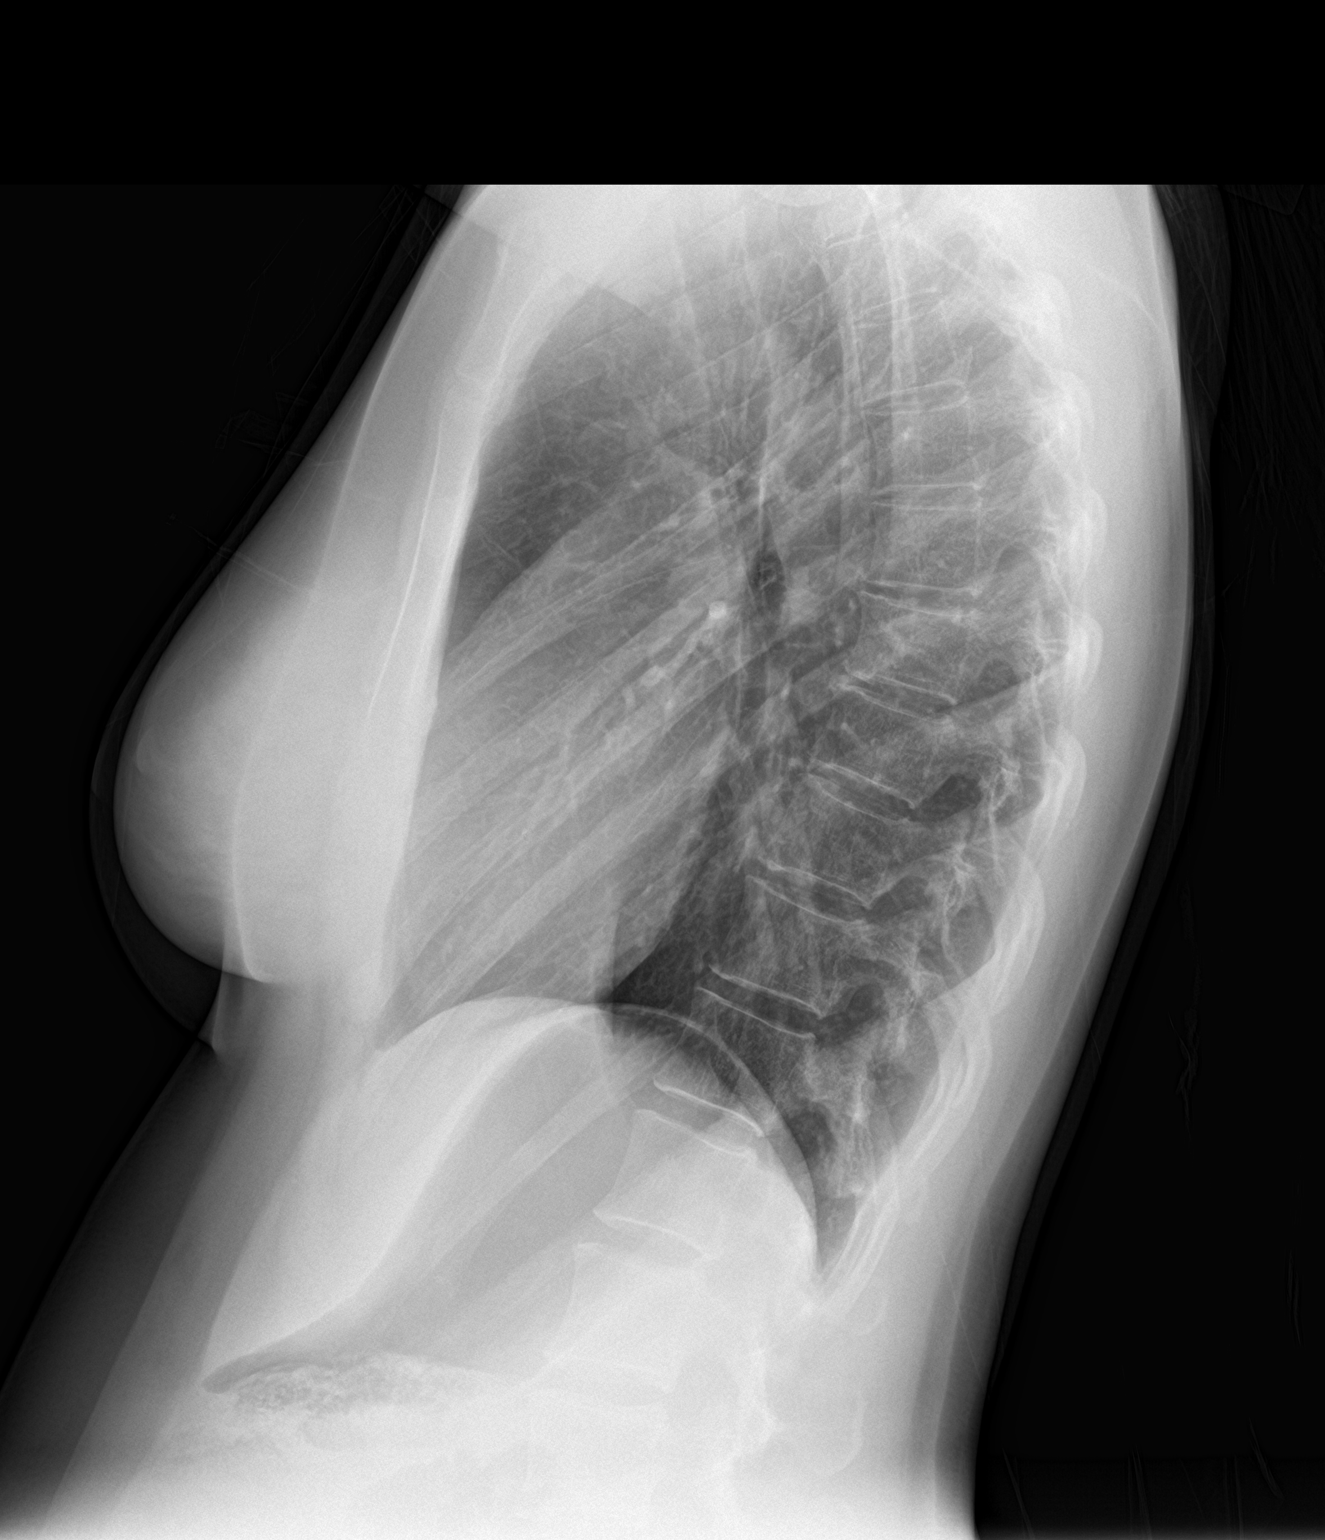

[2 of 2 positions shown; findings below may reference images not displayed]

FINDINGS: The heart size and mediastinal contours are within normal limits.
Both lungs are clear. The visualized skeletal structures are
unremarkable.
IMPRESSION: No active cardiopulmonary disease.

## 2022-10-12 ENCOUNTER — Encounter: Payer: BC Managed Care – PPO | Admitting: Family Medicine

## 2022-10-18 DIAGNOSIS — F411 Generalized anxiety disorder: Secondary | ICD-10-CM | POA: Diagnosis not present

## 2022-10-18 DIAGNOSIS — F902 Attention-deficit hyperactivity disorder, combined type: Secondary | ICD-10-CM | POA: Diagnosis not present

## 2022-10-20 DIAGNOSIS — F902 Attention-deficit hyperactivity disorder, combined type: Secondary | ICD-10-CM | POA: Diagnosis not present

## 2022-10-20 DIAGNOSIS — F411 Generalized anxiety disorder: Secondary | ICD-10-CM | POA: Diagnosis not present

## 2022-11-01 DIAGNOSIS — Z713 Dietary counseling and surveillance: Secondary | ICD-10-CM | POA: Diagnosis not present

## 2022-11-17 DIAGNOSIS — F411 Generalized anxiety disorder: Secondary | ICD-10-CM | POA: Diagnosis not present

## 2022-11-17 DIAGNOSIS — F902 Attention-deficit hyperactivity disorder, combined type: Secondary | ICD-10-CM | POA: Diagnosis not present

## 2022-11-23 DIAGNOSIS — Z713 Dietary counseling and surveillance: Secondary | ICD-10-CM | POA: Diagnosis not present

## 2022-11-24 DIAGNOSIS — F411 Generalized anxiety disorder: Secondary | ICD-10-CM | POA: Diagnosis not present

## 2022-11-24 DIAGNOSIS — F902 Attention-deficit hyperactivity disorder, combined type: Secondary | ICD-10-CM | POA: Diagnosis not present

## 2022-12-15 DIAGNOSIS — F902 Attention-deficit hyperactivity disorder, combined type: Secondary | ICD-10-CM | POA: Diagnosis not present

## 2022-12-15 DIAGNOSIS — F411 Generalized anxiety disorder: Secondary | ICD-10-CM | POA: Diagnosis not present

## 2022-12-21 DIAGNOSIS — Z713 Dietary counseling and surveillance: Secondary | ICD-10-CM | POA: Diagnosis not present

## 2023-01-06 DIAGNOSIS — F902 Attention-deficit hyperactivity disorder, combined type: Secondary | ICD-10-CM | POA: Diagnosis not present

## 2023-01-06 DIAGNOSIS — F411 Generalized anxiety disorder: Secondary | ICD-10-CM | POA: Diagnosis not present

## 2023-01-20 DIAGNOSIS — Z713 Dietary counseling and surveillance: Secondary | ICD-10-CM | POA: Diagnosis not present

## 2023-02-16 DIAGNOSIS — F411 Generalized anxiety disorder: Secondary | ICD-10-CM | POA: Diagnosis not present

## 2023-02-16 DIAGNOSIS — F902 Attention-deficit hyperactivity disorder, combined type: Secondary | ICD-10-CM | POA: Diagnosis not present

## 2023-03-02 DIAGNOSIS — F902 Attention-deficit hyperactivity disorder, combined type: Secondary | ICD-10-CM | POA: Diagnosis not present

## 2023-03-02 DIAGNOSIS — F411 Generalized anxiety disorder: Secondary | ICD-10-CM | POA: Diagnosis not present

## 2023-03-07 DIAGNOSIS — F902 Attention-deficit hyperactivity disorder, combined type: Secondary | ICD-10-CM | POA: Diagnosis not present

## 2023-03-07 DIAGNOSIS — F411 Generalized anxiety disorder: Secondary | ICD-10-CM | POA: Diagnosis not present

## 2023-04-04 ENCOUNTER — Encounter: Payer: Self-pay | Admitting: Family Medicine

## 2023-04-04 ENCOUNTER — Ambulatory Visit: Payer: BC Managed Care – PPO | Admitting: Family Medicine

## 2023-04-04 VITALS — BP 110/60 | HR 97 | Temp 99.0°F | Ht 61.0 in | Wt 109.2 lb

## 2023-04-04 DIAGNOSIS — R197 Diarrhea, unspecified: Secondary | ICD-10-CM | POA: Diagnosis not present

## 2023-04-04 DIAGNOSIS — J069 Acute upper respiratory infection, unspecified: Secondary | ICD-10-CM

## 2023-04-04 DIAGNOSIS — R051 Acute cough: Secondary | ICD-10-CM

## 2023-04-04 LAB — POC COVID19 BINAXNOW: SARS Coronavirus 2 Ag: NEGATIVE

## 2023-04-04 NOTE — Progress Notes (Signed)
Subjective   CC:  Chief Complaint  Patient presents with   Cough    Pt stated that she has had a cough since 04/01/2023 along with a runny nose and sore throat    HPI: Chelsea Collins is a 37 y.o. female who presents to the office today to address the problems listed above in the chief complaint. Patient complains of typical URI symptoms including nasal congestion, mild sore throat, cough, and mild malaise.  The symptoms have been present for 3-4. She denies high fever or productive cough, shortness of breath or significant GI symptoms.  Over-the-counter cold medicines have been minimally or mildly helpful.  She is using Mucinex DM which has been helpful today.  However, had acute episode of loose stools, some incontinence.  No abdominal pain.  Assessment  1. Viral URI with cough   2. Acute cough   3. Diarrhea, unspecified type      Plan  URI, viral syndrome: discussed dx; no sign or sx of bacterial infection is present. Treat supportively with antihistamines, decongestants, and/or cough meds.  She will monitor her stool pattern.  May use antidiarrheal if needed.  If further episodes of fecal incontinence persist, she will return for further evaluation.  Continue Mucinex DM  Follow up: as needed   Orders Placed This Encounter  Procedures   POC COVID-19   No orders of the defined types were placed in this encounter.     I reviewed the patients updated PMH, FH, and SocHx.    Patient Active Problem List   Diagnosis Date Noted   Meniere's disease of left ear 12/22/2021   Sensorineural hearing loss (SNHL) of both ears 12/22/2021   Tinnitus, bilateral 12/22/2021   Chronic cough 09/24/2021   Vision impairment 06/11/2021   Postpartum depression 03/01/2019   Complex partial seizure (HCC) 03/21/2018   Encephalomalacia 03/21/2018   Anxiety disorder 03/21/2018   Epilepsy (HCC) 01/30/2017   Abnormal MRI of head 10/27/2015   History of bacterial meningitis in infancy 10/27/2015    Current Meds  Medication Sig   fluticasone (FLONASE) 50 MCG/ACT nasal spray Place 1 spray into both nostrils daily.   meclizine (ANTIVERT) 25 MG tablet Take 1 tablet (25 mg total) by mouth 3 (three) times daily as needed for dizziness.   Oxcarbazepine (TRILEPTAL) 300 MG tablet TAKE 1 TABLET BY MOUTH TWO TIMES DAILY    Review of Systems: Constitutional: Negative for fever malaise or anorexia Cardiovascular: negative for chest pain Respiratory: negative for SOB or pleuritic chest pain Gastrointestinal: negative for abdominal pain  Objective  Vitals: BP 110/60   Pulse 97   Temp 99 F (37.2 C)   Ht 5\' 1"  (1.549 m)   Wt 109 lb 3.2 oz (49.5 kg)   SpO2 97%   BMI 20.63 kg/m  General: no acute respiratory distress  Psych:  Alert and oriented, normal mood and affect HEENT: Normocephalic, nasal congestion present, TMs w/o erythema, + cervical LAD, supple neck  Cardiovascular:  RRR without murmur or gallop. no peripheral edema Respiratory:  Good breath sounds bilaterally, CTAB with normal respiratory effort Skin:  Warm, no rashes  Office Visit on 04/04/2023  Component Date Value Ref Range Status   SARS Coronavirus 2 Ag 04/04/2023 Negative  Negative Final    Commons side effects, risks, benefits, and alternatives for medications and treatment plan prescribed today were discussed, and the patient expressed understanding of the given instructions. Patient is instructed to call or message via MyChart if he/she has any questions  or concerns regarding our treatment plan. No barriers to understanding were identified. We discussed Red Flag symptoms and signs in detail. Patient expressed understanding regarding what to do in case of urgent or emergency type symptoms.  Medication list was reconciled, printed and provided to the patient in AVS. Patient instructions and summary information was reviewed with the patient as documented in the AVS. This note was prepared with assistance of Dragon voice  recognition software. Occasional wrong-word or sound-a-like substitutions may have occurred due to the inherent limitations of voice recognition software

## 2023-04-06 ENCOUNTER — Ambulatory Visit: Payer: BC Managed Care – PPO | Admitting: Neurology

## 2023-04-13 ENCOUNTER — Other Ambulatory Visit (HOSPITAL_COMMUNITY): Payer: Self-pay | Admitting: Otolaryngology

## 2023-04-13 DIAGNOSIS — R1310 Dysphagia, unspecified: Secondary | ICD-10-CM

## 2023-04-13 DIAGNOSIS — H9313 Tinnitus, bilateral: Secondary | ICD-10-CM | POA: Diagnosis not present

## 2023-04-13 DIAGNOSIS — H8102 Meniere's disease, left ear: Secondary | ICD-10-CM | POA: Diagnosis not present

## 2023-04-13 DIAGNOSIS — H903 Sensorineural hearing loss, bilateral: Secondary | ICD-10-CM | POA: Diagnosis not present

## 2023-04-15 ENCOUNTER — Ambulatory Visit: Payer: BC Managed Care – PPO | Admitting: Neurology

## 2023-04-20 DIAGNOSIS — F411 Generalized anxiety disorder: Secondary | ICD-10-CM | POA: Diagnosis not present

## 2023-04-20 DIAGNOSIS — F902 Attention-deficit hyperactivity disorder, combined type: Secondary | ICD-10-CM | POA: Diagnosis not present

## 2023-05-02 ENCOUNTER — Ambulatory Visit (HOSPITAL_COMMUNITY): Payer: BC Managed Care – PPO

## 2023-05-03 ENCOUNTER — Ambulatory Visit (HOSPITAL_COMMUNITY)
Admission: RE | Admit: 2023-05-03 | Discharge: 2023-05-03 | Disposition: A | Discharge status: Home / Self Care | Payer: BC Managed Care – PPO | Source: Ambulatory Visit | Attending: Otolaryngology | Admitting: Otolaryngology

## 2023-05-03 DIAGNOSIS — R1310 Dysphagia, unspecified: Secondary | ICD-10-CM | POA: Insufficient documentation

## 2023-05-04 DIAGNOSIS — H9042 Sensorineural hearing loss, unilateral, left ear, with unrestricted hearing on the contralateral side: Secondary | ICD-10-CM | POA: Diagnosis not present

## 2023-05-05 ENCOUNTER — Ambulatory Visit: Payer: BC Managed Care – PPO | Admitting: Neurology

## 2023-05-05 ENCOUNTER — Encounter: Payer: Self-pay | Admitting: Neurology

## 2023-05-05 VITALS — BP 102/69 | HR 88 | Ht 61.0 in | Wt 111.6 lb

## 2023-05-05 DIAGNOSIS — G40309 Generalized idiopathic epilepsy and epileptic syndromes, not intractable, without status epilepticus: Secondary | ICD-10-CM | POA: Diagnosis not present

## 2023-05-05 MED ORDER — OXCARBAZEPINE 300 MG PO TABS: ORAL_TABLET | Freq: Two times a day (BID) | ORAL | 3 refills | Status: DC

## 2023-05-05 NOTE — Patient Instructions (Signed)
Always good to see you. Continue Oxcarbazepine 300mg  twice a day. Continue follow-up with ENT and Vestibular therapy. Follow-up in 1 year, call for any changes.   Seizure Precautions: 1. If medication has been prescribed for you to prevent seizures, take it exactly as directed.  Do not stop taking the medicine without talking to your doctor first, even if you have not had a seizure in a long time.   2. Avoid activities in which a seizure would cause danger to yourself or to others.  Don't operate dangerous machinery, swim alone, or climb in high or dangerous places, such as on ladders, roofs, or girders.  Do not drive unless your doctor says you may.  3. If you have any warning that you may have a seizure, lay down in a safe place where you can't hurt yourself.    4.  No driving for 6 months from last seizure, as per [facility].   Please refer to the following link on the Epile[employer] of [employer]'s website for more information: http://www.epilepsyfoundation.org/answerplace/Social/driving/drivingu.cfm   5.  Maintain good sleep hygiene. Avoid alcohol.  6.  Notify your neurology if you are planning pregnancy or if you become pregnant.  7.  Contact your doctor if you have any problems that may be related to the medicine you are taking.  8.  Call 911 and bring the patient back to the ED if:        A.  The seizure lasts longer than 5 minutes.       B.  The patient doesn't awaken shortly after the seizure  C.  The patient has new problems such as difficulty seeing, speaking or moving  D.  The patient was injured during the seizure  E.  The patient has a temperature over 102 F (39C)  F.  The patient vomited and now is having trouble breathing

## 2023-05-05 NOTE — Progress Notes (Signed)
NEUROLOGY FOLLOW UP OFFICE NOTE  Chelsea Collins [id number] 07-Dec-1985  HISTORY OF PRESENT ILLNESS: I had the pleasure of seeing Chelsea Collins in follow-up in the neurology clinic on 05/05/2023.  The patient was last seen on a year ago for seizures. She is alone in the office today. today.  Records and images were personally reviewed where available.  Since her last visit, she continues to do well from a seizure standpoint, seizure-free since May 2018 on Oxcarbazepine 300mg  BID, no side effects. There was only one incident of odd symptoms when she was by a fire pit 1-2 weeks ago and some vision changes where the building looked wavy, but thinks it is due to the gas. She continues to see ENT for Meniere's disease and will be doing vestibular therapy for symptoms of feeling off after a bout of Meniere's. She denies any falls. She denies any staring/unresponsive episodes, gaps in time, olfactory/gustatory hallucinations, focal numbness/tingling/weakness, myoclonic jerks, headaches. Sleep and mood are good.    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 She was diagnosed with epilepsy in childhood when she was having staring spells. She reports that seizures stopped at age 73 and she was taken off seizure medication at age 37. She denies any myoclonic jerks or convulsions. In 2012, she started having episodes where her left leg would become weak for a minute or so, sometimes it felt like her left arm was also affected. She had an MRI brain in [place] reported as showing previous injury to the occipital lobe. She saw a neurologist at that time with concern for MS, she was reassured there was no evidence of MS. In 2014, she was in a classroom and had an episode where she apparently walked toward the wall and stood there just staring and unresponsive. She was amnestic of the event  until her supervisor called her to the office and told her what happened. She recalls another episode that same semester, she was stressed and waiting in the lobby, then did not know how she got there. She had an MRI brain without contrast which did not show any acute changes, there was note of ulceration in the calcarine cortex of both hemispheres, left greater than right, increased FLAIR signal going into the deep while matter with decreased signal centrally. Wake and drowsy EEG was normal. When she left school, she reports episodes were not as bad, she was still having difficulties with her left side and started having troubles with anxiety as well. She would be at a store and would not remember a word or a color. 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 She had to sit on the floor and was pale. When she moved to [place], she started having episodes of feeling hot, nauseated, weird, pale, as well as zoning out ("walking into traffic"). She saw neurologist Dr. Terrace Arabia and had a repeat EEG which was abnormal, during hyperventilation which was performed twice, there were intermittent short bursts of generalized spike sharp wave, indicating a generalized epilepsy disorder. She was started on Lamotrigine but 5 weeks later had a severe allergic reaction with swollen lymph nodes, night sweats, then rash. She was switched to Keppra and  had a more severe immediate reaction within 1-2 days with rash and facial swelling. She was then switched to Trileptal which she tolerated well, and reports that all the symptoms she was having went away. She last saw Dr. Terrace Arabia in February to discuss Trileptal and contraceptives, and made the decision to switch to Vimpat. She presents today asking to be switched back to Trileptal, stating that the Vimpat was not  controlling her symptoms. She would have episodes of turning Gruner, nausea, vision change, staring off into space unresponsive. She was also having headaches with vomiting. Her psychiatrist was concerned that Remeron was also contributing to symptoms, but she had been taking Remeron with Trileptal without these symptoms.    She also reports episodes where she would have a sudden speech impediment lasting 30 seconds, where she knows what she wants to say but cannot get the words out or form the words. Twice in the past 2 years she has felt a sensation of fear prior to the speech difficulties. She denies any olfactory/gustatory hallucinations, the left-sided symptoms have not recurred since 2014, no myoclonic jerks. She has had headaches every other day when she is late to take her Vimpat dose, resolving after she takes the medication. Pain is around her eyes with some photosensitivity, no nausea/vomiting. She denies any diplopia, dizziness, neck/back pain, bowel/bladder dysfunction.   Epilepsy Risk Factors:  Bacterial meningitis in infancy with encephalomalacia in the bilateral occipital lobes. Her maternal cousin had seizures in childhood, a maternal uncle had 2 seizures felt to be related to glucose levels. Otherwise she had a normal birth and early development.  There is no history of febrile convulsions, significant traumatic brain injury, neurosurgical procedures.   Prior AEDs: Keppra, Lamotrigine, Trileptal  PAST MEDICAL HISTORY: Past Medical History:  Diagnosis Date   Anxiety disorder    Encephalomalacia 03/21/2018   Occipital bilateral   GAD (generalized anxiety disorder) 03/21/2018   Heat intolerance    Hypoglycemia    Meningitis    Postpartum depression 03/01/2019   Seizures (HCC)    as an infant    MEDICATIONS: Current Outpatient Medications on File Prior to Visit  Medication Sig Dispense Refill   meclizine (ANTIVERT) 25 MG tablet Take 1 tablet (25 mg total) by mouth 3 (three) times  daily as needed for dizziness. 30 tablet 1   methylphenidate (RITALIN) 10 MG tablet Take 10 mg by mouth 2 (two) times daily with breakfast and lunch.     Oxcarbazepine (TRILEPTAL) 300 MG tablet TAKE 1 TABLET BY MOUTH TWO TIMES DAILY 180 tablet 3   No current facility-administered medications on file prior to visit.    ALLERGIES: Allergies  Allergen Reactions   Keppra [Levetiracetam] Swelling and Rash   Lamictal [Lamotrigine] Rash   Contrast Media [Iodinated Contrast Media] Cough    MRI contrast NOT CT     FAMILY HISTORY: Family History  Problem Relation Age of Onset   Depression Mother    Miscarriages / [place] Mother    Intellectual disability Brother    Bipolar disorder Brother    Autism Brother    Diabetes Maternal Grandmother    Cancer Maternal Grandmother    Breast cancer Maternal Grandmother    Hypertension Maternal Grandfather    Heart disease Maternal Grandfather    Heart attack Maternal Grandfather     SOCIAL HISTORY: Social History   Socioeconomic History   Marital status: Married    Spouse name: Not on file   Number of children: 1   Years  of education: 14   Highest education level: Not on file  Occupational History    Comment: [employer]  Tobacco Use   Smoking status: Never   Smokeless tobacco: Never  Vaping Use   Vaping status: Never Used  Substance and Sexual Activity   Alcohol use: No    Alcohol/week: 0.0 standard drinks of alcohol   Drug use: No   Sexual activity: Yes    Partners: Male    Birth control/protection: Condom  Other Topics Concern   Not on file  Social History Narrative   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   Two 12oz cans of soda daily.   Social Determinants of Health   Financial Resource Strain: Low Risk  (09/21/2018)   Overall Financial Resource Strain (CARDIA)    Difficulty of Paying Living Expenses: Not hard at all  Food Insecurity: Low Risk  (04/13/2023)   Received from [facility]   Food vital sign     Within the past 12 months, you worried that your food would run out before you got money to buy more: Never true    Within the past 12 months, the food you bought just didn't last and you didn't have money to get more. : Never true  Transportation Needs: Unknown (09/21/2018)   PRAPARE - Administrator, Civil Service (Medical): No    Lack of Transportation (Non-Medical): Not on file  Physical Activity: Not on file  Stress: No Stress Concern Present (09/21/2018)   [employer] of Occupational Health - Occupational Stress Questionnaire    Feeling of Stress : Not at all  Social Connections: Not on file  Intimate Partner Violence: Not At Risk (09/21/2018)   Humiliation, Afraid, Rape, and Kick questionnaire    Fear of Current or Ex-Partner: No    Emotionally Abused: No    Physically Abused: No    Sexually Abused: No     PHYSICAL EXAM: Vitals:   05/05/23 0933  BP: 102/69  Pulse: 88  SpO2: 98%   General: No acute distress Head:  Normocephalic/atraumatic Skin/Extremities: No rash, no edema Neurological Exam: alert and awake. No aphasia or dysarthria. Fund of knowledge is appropriate.   Attention and concentration are normal.   Cranial nerves: Pupils equal, round. Extraocular movements intact except for left eye abduction and left esotropia (chronic), no nystagmus. Restricted visual fields, more on the left (chronic). No facial asymmetry.  Motor: Bulk and tone normal, muscle strength 5/5 throughout with no pronator drift.   Finger to nose testing intact.  Gait narrow-based and steady, able to tandem walk adequately.  Romberg negative.   IMPRESSION: This is a pleasant 37 yo RH woman with a history of bacterial meningitis in infancy with bilateral occipital encephalomalacia, seizures in childhood with staring spells that initially stopped at age 24 but recurred in 2014. She also started having recurrent episodes of left-sided weakness in 2014 that had quieted down. She had  recurrent episodes of becoming pale, nauseated, with staring spells that stopped after initiation of oxcarbazepine. EEG showed generalized discharges during hyperventilation suggestive of a generalized epilepsy. She has been seizure-free since May 2018 on Oxcarbazepine 300mg  BID, refills sent. Continue follow-up with ENT and Vestibular therapy. She does not drive. Follow-up in 1 year, call for any changes.   Thank you for allowing me to participate in her care.  Please do not hesitate to call for any questions or concerns.   Patrcia Dolly, M.D.   CC: Dr. Mardelle Matte

## 2023-06-15 DIAGNOSIS — F411 Generalized anxiety disorder: Secondary | ICD-10-CM | POA: Diagnosis not present

## 2023-06-15 DIAGNOSIS — F902 Attention-deficit hyperactivity disorder, combined type: Secondary | ICD-10-CM | POA: Diagnosis not present

## 2023-06-16 DIAGNOSIS — F902 Attention-deficit hyperactivity disorder, combined type: Secondary | ICD-10-CM | POA: Diagnosis not present

## 2023-06-16 DIAGNOSIS — F411 Generalized anxiety disorder: Secondary | ICD-10-CM | POA: Diagnosis not present

## 2023-07-13 DIAGNOSIS — F902 Attention-deficit hyperactivity disorder, combined type: Secondary | ICD-10-CM | POA: Diagnosis not present

## 2023-07-13 DIAGNOSIS — F411 Generalized anxiety disorder: Secondary | ICD-10-CM | POA: Diagnosis not present

## 2023-08-24 DIAGNOSIS — F902 Attention-deficit hyperactivity disorder, combined type: Secondary | ICD-10-CM | POA: Diagnosis not present

## 2023-08-24 DIAGNOSIS — F411 Generalized anxiety disorder: Secondary | ICD-10-CM | POA: Diagnosis not present

## 2023-09-06 DIAGNOSIS — F411 Generalized anxiety disorder: Secondary | ICD-10-CM | POA: Diagnosis not present

## 2023-09-06 DIAGNOSIS — F902 Attention-deficit hyperactivity disorder, combined type: Secondary | ICD-10-CM | POA: Diagnosis not present

## 2023-10-03 DIAGNOSIS — F902 Attention-deficit hyperactivity disorder, combined type: Secondary | ICD-10-CM | POA: Diagnosis not present

## 2023-10-03 DIAGNOSIS — F411 Generalized anxiety disorder: Secondary | ICD-10-CM | POA: Diagnosis not present

## 2023-11-02 DIAGNOSIS — F411 Generalized anxiety disorder: Secondary | ICD-10-CM | POA: Diagnosis not present

## 2023-11-02 DIAGNOSIS — F902 Attention-deficit hyperactivity disorder, combined type: Secondary | ICD-10-CM | POA: Diagnosis not present

## 2023-11-08 DIAGNOSIS — F902 Attention-deficit hyperactivity disorder, combined type: Secondary | ICD-10-CM | POA: Diagnosis not present

## 2023-11-08 DIAGNOSIS — F411 Generalized anxiety disorder: Secondary | ICD-10-CM | POA: Diagnosis not present

## 2023-11-16 DIAGNOSIS — F411 Generalized anxiety disorder: Secondary | ICD-10-CM | POA: Diagnosis not present

## 2023-11-16 DIAGNOSIS — F902 Attention-deficit hyperactivity disorder, combined type: Secondary | ICD-10-CM | POA: Diagnosis not present

## 2023-11-28 DIAGNOSIS — F902 Attention-deficit hyperactivity disorder, combined type: Secondary | ICD-10-CM | POA: Diagnosis not present

## 2023-11-28 DIAGNOSIS — F411 Generalized anxiety disorder: Secondary | ICD-10-CM | POA: Diagnosis not present

## 2023-12-06 ENCOUNTER — Telehealth: Payer: Self-pay | Admitting: Neurology

## 2023-12-06 DIAGNOSIS — F411 Generalized anxiety disorder: Secondary | ICD-10-CM | POA: Diagnosis not present

## 2023-12-06 DIAGNOSIS — F902 Attention-deficit hyperactivity disorder, combined type: Secondary | ICD-10-CM | POA: Diagnosis not present

## 2023-12-06 NOTE — Telephone Encounter (Signed)
 Pt wants to know if Dr.Aquino is the only provider that can increase or lower her trileptal. She has noticed it has helped her with ADHD and anxiety. She just wast curious if aquino was the only one that could adjust it. She would like to speak with someone about that.

## 2023-12-07 NOTE — Telephone Encounter (Signed)
 I can increase the oxcarbazepine 300mg : take 1.5 tabs (450mg ) twice a day and she can let us know if helpful. If not, recommend Psychiatry eval for ADHD and anxiety. Let me know if she wants to proceed, thanks

## 2023-12-08 NOTE — Telephone Encounter (Signed)
 Pls check which pharmacy, [facility] I sent Rx to last time or [facility]? Thanks

## 2023-12-08 NOTE — Telephone Encounter (Addendum)
 Pt called an informed that Dr Karel Jarvis said we can increase the oxcarbazepine 300mg : take 1.5 tabs (450mg ) twice a day and she can let us know if helpful. If not, recommend Psychiatry eval for ADHD and anxiety. Let me know if she wants to proceed. Pt said she already sees psychiatry   Pt would like the new RX for oxcarbazepine

## 2023-12-09 MED ORDER — OXCARBAZEPINE 300 MG PO TABS: ORAL_TABLET | ORAL | 3 refills | Status: DC

## 2023-12-09 NOTE — Telephone Encounter (Signed)
 [facility] on [address] is where she wants it sent

## 2023-12-09 NOTE — Addendum Note (Signed)
 Addended by: Van Clines on: 12/09/2023 10:11 AM   Modules accepted: Orders

## 2023-12-13 DIAGNOSIS — F902 Attention-deficit hyperactivity disorder, combined type: Secondary | ICD-10-CM | POA: Diagnosis not present

## 2023-12-13 DIAGNOSIS — F411 Generalized anxiety disorder: Secondary | ICD-10-CM | POA: Diagnosis not present

## 2023-12-20 DIAGNOSIS — F902 Attention-deficit hyperactivity disorder, combined type: Secondary | ICD-10-CM | POA: Diagnosis not present

## 2023-12-20 DIAGNOSIS — F411 Generalized anxiety disorder: Secondary | ICD-10-CM | POA: Diagnosis not present

## 2024-01-19 ENCOUNTER — Ambulatory Visit: Admitting: Family Medicine

## 2024-01-19 ENCOUNTER — Encounter: Payer: Self-pay | Admitting: Family Medicine

## 2024-01-19 VITALS — BP 114/78 | HR 80 | Temp 98.2°F | Ht 61.0 in | Wt 112.8 lb

## 2024-01-19 DIAGNOSIS — R1310 Dysphagia, unspecified: Secondary | ICD-10-CM

## 2024-01-19 DIAGNOSIS — J45991 Cough variant asthma: Secondary | ICD-10-CM | POA: Diagnosis not present

## 2024-01-19 DIAGNOSIS — J309 Allergic rhinitis, unspecified: Secondary | ICD-10-CM

## 2024-01-19 DIAGNOSIS — R053 Chronic cough: Secondary | ICD-10-CM

## 2024-01-19 MED ORDER — FLUTICASONE-SALMETEROL 250-50 MCG/ACT IN AEPB: 1.0000 | INHALATION_SPRAY | Freq: Two times a day (BID) | RESPIRATORY_TRACT | 2 refills | Status: DC

## 2024-01-19 MED ORDER — FLUTICASONE PROPIONATE 50 MCG/ACT NA SUSP: 1.0000 | Freq: Every day | NASAL | 6 refills | Status: DC

## 2024-01-19 MED ORDER — OMEPRAZOLE 20 MG PO CPDR: 20.0000 mg | DELAYED_RELEASE_CAPSULE | Freq: Every day | ORAL | 3 refills | Status: DC

## 2024-01-19 NOTE — Patient Instructions (Signed)
 Please return for complete physical and follow up on cough in 2-3 months.  If you have any questions or concerns, please don't hesitate to send me a message via MyChart or call the office at [phone]. Thank you for visiting with us  today! It's our pleasure caring for you.    VISIT SUMMARY: Today, you were seen for a persistent dry cough that has been ongoing for several years. We discussed your symptoms, including coughing fits in the morning and at night, and your history of treatments that have not been effective. We also reviewed your history of Meniere's disease and your current management strategies.  YOUR PLAN: -CHRONIC COUGH: Chronic cough is a cough that lasts for an extended period of time. We will start you on Flonase  for your nasal symptoms and recommend Zyrtec  or Allegra for allergy management. Additionally, we will prescribe a daily inhaler with a long-acting bronchodilator to see if it helps with your cough. A prescription antacid will also be provided in case reflux is contributing to your symptoms. We will monitor your symptoms for 1-3 months and reassess. If there is no improvement in 4-6 weeks, we may refer you to a pulmonologist.  -POSSIBLE COUGH VARIANT ASTHMA: Cough variant asthma is a type of asthma where the main symptom is a dry cough. We will prescribe a daily inhaler with a long-acting bronchodilator to see if it helps with your cough. We will monitor your symptoms for 1-3 months and reassess. If there is no improvement in 4-6 weeks, we may refer you to a pulmonologist.  -ALLERGIC RHINITIS: Allergic rhinitis is an allergic reaction that causes sneezing, watery eyes, and nasal congestion. We will start you on Flonase  for your nasal symptoms and recommend Zyrtec  or Allegra for allergy management.  -MENIERE'S DISEASE: Meniere's disease is a disorder of the inner ear that can cause episodes of dizziness. Continue to manage your salt intake to help control your  symptoms.  INSTRUCTIONS: Please follow the prescribed treatments and monitor your symptoms. We will reassess your condition in 1-3 months. If there is no improvement in 4-6 weeks, we may refer you to a pulmonologist. Continue to manage your salt intake for Meniere's disease.                      Contains text generated by Abridge.                                 Contains text generated by Abridge.

## 2024-01-19 NOTE — Progress Notes (Signed)
 Subjective  CC:  Chief Complaint  Patient presents with   Cough    Pt would like a referral for a cough that she has had for the past 5 years. It  has now gotten worse    HPI: Chelsea Collins is a 38 y.o. female who presents to the office today to address the problems listed above in the chief complaint. Discussed the use of AI scribe software for clinical note transcription with the patient, who gave verbal consent to proceed.  History of Present Illness Chelsea Collins is a 38 year old female with chronic cough who presents with persistent dry cough. She is accompanied by Bambi Lever, her partner.  She has experienced a persistent dry cough for several years, characterized by fits particularly in the morning and when lying down at night. These episodes last about five minutes, during which her eyes water as she struggles to catch her breath. She recalls similar issues since childhood, with prolonged coughing after illnesses.  She has been treated with omeprazole  and allergy medications in the past, but these have not significantly improved her cough. Her cough does not worsen with pollen exposure, although she experiences sneezing, nasal congestion, and watery eyes during high pollen seasons. Over-the-counter allergy medications help alleviate these symptoms but do not significantly impact her cough. A swallow test ordered by an ENT returned normal results. I reviewed ent notes: normal esophogram/barium swallow with tablet study. Had c/o dysphagia as well. She is not on any long-term allergy medications and does not currently take any medication for her cough. Talking on the phone for extended periods can trigger her coughing fits. Has had normal chest xray.   There is no known family history of asthma, although she is unaware of her father's side of the family's medical history. She does not exercise regularly and works from home, limiting her physical activity. She denies heartburn or reflux  symptoms, although her cough can be worse when lying down or upon waking up.  She has a history of Meniere's disease, which has been stable with dietary modifications to limit salt intake. She has not experienced significant episodes of dizziness recently.   Assessment  1. Chronic cough   2. Cough variant asthma   3. Chronic allergic rhinitis   4. Dysphagia, unspecified type      Plan  Assessment and Plan Assessment & Plan Chronic cough Chronic dry cough with episodes of coughing fits, possibly triggered by prolonged phone conversations and recumbency. Previous treatments with omeprazole  and allergy medications were not clearly effective. ENT evaluation and swallow test were unremarkable. Differential includes cough variant asthma and allergic rhinitis, GERD or other. - Prescribe Flonase  for nasal symptoms. - Recommend Zyrtec  or Allegra for allergy management. - Prescribe a daily inhaler with a long-acting bronchodilator to assess for cough variant asthma. Advair bid - Prescribe a prescription antacid for potential reflux. Omeprazole  20 daily - Monitor symptoms for 1-3 months and reassess. - Consider referral to a pulmonologist if no improvement in 4-6 weeks.  Possible cough variant asthma Chronic cough exacerbated by certain activities suggests cough variant asthma. No family history of asthma. History of prolonged coughs during childhood illnesses. Discussed trial of inhaler as diagnostic and therapeutic approach. - Prescribe a daily inhaler with a long-acting bronchodilator to assess for cough variant asthma. - Monitor symptoms for 1-3 months and reassess. - Consider referral to a pulmonologist if no improvement in 4-6 weeks.  Allergic rhinitis Symptoms include sneezing, watery eyes, and nasal congestion, particularly during  high pollen seasons. Over-the-counter allergy medications effective for these symptoms but not for chronic cough. - Prescribe Flonase  for nasal symptoms. -  Recommend Zyrtec  or Allegra for allergy management.  Meniere's disease Previous episodes of dizziness with some current dizziness noted. Salt intake somewhat controlled. - Manage salt intake.  Overdue for HM/pap/CPE;  Follow up: rec f/u for cpe No orders of the defined types were placed in this encounter.  Meds ordered this encounter  Medications   fluticasone  (FLONASE ) 50 MCG/ACT nasal spray    Sig: Place 1 spray into both nostrils daily.    Dispense:  16 g    Refill:  6   omeprazole  (PRILOSEC) 20 MG capsule    Sig: Take 1 capsule (20 mg total) by mouth daily.    Dispense:  30 capsule    Refill:  3   fluticasone -salmeterol (ADVAIR) 250-50 MCG/ACT AEPB    Sig: Inhale 1 puff into the lungs in the morning and at bedtime.    Dispense:  60 each    Refill:  2     I reviewed the patients updated PMH, FH, and SocHx.  Patient Active Problem List   Diagnosis Date Noted   Meniere's disease of left ear 12/22/2021   Sensorineural hearing loss (SNHL) of both ears 12/22/2021   Tinnitus, bilateral 12/22/2021   Chronic cough 09/24/2021   Vision impairment 06/11/2021   Postpartum depression 03/01/2019   Complex partial seizure (HCC) 03/21/2018   Encephalomalacia 03/21/2018   Anxiety disorder 03/21/2018   Epilepsy (HCC) 01/30/2017   Abnormal MRI of head 10/27/2015   History of bacterial meningitis in infancy 10/27/2015   Current Meds  Medication Sig   fluticasone  (FLONASE ) 50 MCG/ACT nasal spray Place 1 spray into both nostrils daily.   fluticasone -salmeterol (ADVAIR) 250-50 MCG/ACT AEPB Inhale 1 puff into the lungs in the morning and at bedtime.   meclizine  (ANTIVERT ) 25 MG tablet Take 1 tablet (25 mg total) by mouth 3 (three) times daily as needed for dizziness.   methylphenidate (RITALIN) 10 MG tablet Take 10 mg by mouth 2 (two) times daily with breakfast and lunch.   omeprazole  (PRILOSEC) 20 MG capsule Take 1 capsule (20 mg total) by mouth daily.   Oxcarbazepine  (TRILEPTAL ) 300 MG  tablet Take 1 and 1/2 tablets twice a day   Allergies: Patient is allergic to keppra  [levetiracetam ], lamictal  [lamotrigine ], and contrast media [iodinated contrast media]. Family History: Patient family history includes Autism in her brother; Bipolar disorder in her brother; Breast cancer in her maternal grandmother; Cancer in her maternal grandmother; Depression in her mother; Diabetes in her maternal grandmother; Heart attack in her maternal grandfather; Heart disease in her maternal grandfather; Hypertension in her maternal grandfather; Intellectual disability in her brother; Miscarriages / [place] in her mother. Social History:  Patient  reports that she has never smoked. She has never used smokeless tobacco. She reports that she does not drink alcohol and does not use drugs.  Review of Systems: Constitutional: Negative for fever malaise or anorexia Cardiovascular: negative for chest pain Respiratory: negative for SOB or persistent cough Gastrointestinal: negative for abdominal pain  Objective  Vitals: BP 114/78   Pulse 80   Temp 98.2 F (36.8 C)   Ht 5\' 1"  (1.549 m)   Wt 112 lb 12.8 oz (51.2 kg)   SpO2 98%   BMI 21.31 kg/m  General: no acute distress , A&Ox3, + hacking cough episodically HEENT: PEERL, conjunctiva normal, neck is supple, OP clear, TM clear bilaterally Cardiovascular:  RRR  without murmur or gallop.  Respiratory:  Good breath sounds bilaterally, CTAB with normal respiratory effort, no wheezing. Some cough with deep breaths Skin:  Warm, no rashes Commons side effects, risks, benefits, and alternatives for medications and treatment plan prescribed today were discussed, and the patient expressed understanding of the given instructions. Patient is instructed to call or message via MyChart if he/she has any questions or concerns regarding our treatment plan. No barriers to understanding were identified. We discussed Red Flag symptoms and signs in detail. Patient  expressed understanding regarding what to do in case of urgent or emergency type symptoms.  Medication list was reconciled, printed and provided to the patient in AVS. Patient instructions and summary information was reviewed with the patient as documented in the AVS. This note was prepared with assistance of Dragon voice recognition software. Occasional wrong-word or sound-a-like substitutions may have occurred due to the inherent limitations of voice recognition software

## 2024-01-25 ENCOUNTER — Other Ambulatory Visit (HOSPITAL_BASED_OUTPATIENT_CLINIC_OR_DEPARTMENT_OTHER): Payer: Self-pay

## 2024-02-01 DIAGNOSIS — F902 Attention-deficit hyperactivity disorder, combined type: Secondary | ICD-10-CM | POA: Diagnosis not present

## 2024-02-01 DIAGNOSIS — F411 Generalized anxiety disorder: Secondary | ICD-10-CM | POA: Diagnosis not present

## 2024-02-16 DIAGNOSIS — F411 Generalized anxiety disorder: Secondary | ICD-10-CM | POA: Diagnosis not present

## 2024-02-16 DIAGNOSIS — F902 Attention-deficit hyperactivity disorder, combined type: Secondary | ICD-10-CM | POA: Diagnosis not present

## 2024-02-21 ENCOUNTER — Encounter: Admitting: Family Medicine

## 2024-03-09 DIAGNOSIS — F411 Generalized anxiety disorder: Secondary | ICD-10-CM | POA: Diagnosis not present

## 2024-03-09 DIAGNOSIS — F902 Attention-deficit hyperactivity disorder, combined type: Secondary | ICD-10-CM | POA: Diagnosis not present

## 2024-04-09 DIAGNOSIS — F902 Attention-deficit hyperactivity disorder, combined type: Secondary | ICD-10-CM | POA: Diagnosis not present

## 2024-04-09 DIAGNOSIS — F411 Generalized anxiety disorder: Secondary | ICD-10-CM | POA: Diagnosis not present

## 2024-04-18 ENCOUNTER — Encounter: Payer: Self-pay | Admitting: Family Medicine

## 2024-04-18 ENCOUNTER — Telehealth: Payer: Self-pay

## 2024-04-18 ENCOUNTER — Other Ambulatory Visit: Payer: Self-pay

## 2024-04-18 ENCOUNTER — Ambulatory Visit: Admitting: Family Medicine

## 2024-04-18 VITALS — BP 106/67 | HR 89 | Temp 98.2°F | Ht 61.0 in | Wt 116.2 lb

## 2024-04-18 DIAGNOSIS — R053 Chronic cough: Secondary | ICD-10-CM | POA: Diagnosis not present

## 2024-04-18 DIAGNOSIS — Z23 Encounter for immunization: Secondary | ICD-10-CM | POA: Diagnosis not present

## 2024-04-18 DIAGNOSIS — J45991 Cough variant asthma: Secondary | ICD-10-CM | POA: Insufficient documentation

## 2024-04-18 MED ORDER — ALBUTEROL SULFATE HFA 108 (90 BASE) MCG/ACT IN AERS: 2.0000 | INHALATION_SPRAY | RESPIRATORY_TRACT | 2 refills | Status: AC | PRN

## 2024-04-18 MED ORDER — ALBUTEROL SULFATE HFA 108 (90 BASE) MCG/ACT IN AERS: 2.0000 | INHALATION_SPRAY | RESPIRATORY_TRACT | 2 refills | Status: DC | PRN

## 2024-04-18 MED ORDER — FLUTICASONE-SALMETEROL 250-50 MCG/ACT IN AEPB: 1.0000 | INHALATION_SPRAY | Freq: Two times a day (BID) | RESPIRATORY_TRACT | 3 refills | Status: AC

## 2024-04-18 MED ORDER — FLUTICASONE-SALMETEROL 250-50 MCG/ACT IN AEPB: 1.0000 | INHALATION_SPRAY | Freq: Two times a day (BID) | RESPIRATORY_TRACT | 3 refills | Status: DC

## 2024-04-18 MED ORDER — MONTELUKAST SODIUM 10 MG PO TABS: 10.0000 mg | ORAL_TABLET | Freq: Every day | ORAL | 3 refills | Status: AC

## 2024-04-18 NOTE — Telephone Encounter (Signed)
 Copied from CRM [id number]. Topic: Clinical - Prescription Issue >> Apr 18, 2024  1:04 PM Deaijah H wrote: Reason for CRM: Patient called in to have prescriptions fluticasone -salmeterol (ADVAIR) 250-50 MCG/ACT & albuterol  (VENTOLIN  HFA) 108 (90 Base) MCG/ACT inhaler sent to  [facility] [address] [place] [address] Phone: [phone] Fax: [phone]. Please and confirm once completed.  Rx sent

## 2024-04-18 NOTE — Patient Instructions (Signed)
 Please follow up as scheduled for your next visit with me: 05/16/2024   If you have any questions or concerns, please don't hesitate to send me a message via MyChart or call the office at [phone]. Thank you for visiting with us  today! It's our pleasure caring for you.   Asthma, Adult  Asthma is a long-term (chronic) condition that causes recurrent episodes in which the lower airways in the lungs become tight and narrow. The narrowing is caused by inflammation and tightening of the smooth muscle around the lower airways. Asthma episodes, also called asthma attacks or asthma flares, may cause coughing, making high-pitched whistling sounds when you breathe, most often when you breathe out (wheezing), shortness of breath, and chest pain. The airways may produce extra mucus caused by the inflammation and irritation. During an attack, it can be difficult to breathe. Asthma attacks can range from minor to life-threatening. Asthma cannot be cured, but medicines and lifestyle changes can help control it and treat acute attacks. It is important to keep your asthma well controlled so the condition does not interfere with your daily life. What are the causes? This condition is believed to be caused by inherited (genetic) and environmental factors, but its exact cause is not known. What can trigger an asthma attack? Many things can bring on an asthma attack or make symptoms worse. These triggers are different for every person. Common triggers include: Allergens and irritants like mold, dust, pet dander, cockroaches, pollen, air pollution, and chemical odors. Cigarette smoke. Weather changes and cold air. Stress and strong emotional responses such as crying or laughing hard. Certain medications such as aspirin or beta blockers. Infections and inflammatory conditions, such as the flu, a cold, pneumonia, or inflammation of the nasal membranes (rhinitis). Gastroesophageal reflux disease (GERD). What are the  signs or symptoms? Symptoms may occur right after exposure to an asthma trigger or hours later and can vary by person. Common signs and symptoms include: Wheezing. Trouble breathing (shortness of breath). Excessive nighttime or early morning coughing. Chest tightness. Tiredness (fatigue) with minimal activity. Difficulty talking in complete sentences. Poor exercise tolerance. How is this diagnosed? This condition is diagnosed based on: A physical exam and your medical history. Tests, which may include: Lung function studies to evaluate the flow of air in your lungs. Allergy tests. Imaging tests, such as X-rays. How is this treated? There is no cure, but symptoms can be controlled with proper treatment. Treatment usually involves: Identifying and avoiding your asthma triggers. Inhaled medicines. Two types are commonly used to treat asthma, depending on severity: Controller medicines. These help prevent asthma symptoms from occurring. They are taken every day. Fast-acting reliever or rescue medicines. These quickly relieve asthma symptoms. They are used as needed and provide short-term relief. Using other medicines, such as: Allergy medicines, such as antihistamines, if your asthma attacks are triggered by allergens. Immune medicines (immunomodulators). These are medicines that help control the immune system. Using supplemental oxygen. This is only needed during a severe episode. Creating an asthma action plan. An asthma action plan is a written plan for managing and treating your asthma attacks. This plan includes: A list of your asthma triggers and how to avoid them. Information about when medicines should be taken and when their dosage should be changed. Instructions about using a device called a peak flow meter. A peak flow meter measures how well the lungs are working and the severity of your asthma. It helps you monitor your condition. Follow these instructions at home:  Take  over-the-counter and prescription medicines only as told by your health care provider. Stay up to date on all vaccinations as recommended by your healthcare provider, including vaccines for the flu and pneumonia. Use a peak flow meter and keep track of your peak flow readings. Understand and use your asthma action plan to address any asthma flares. Do not smoke or allow anyone to smoke in your home. Contact a health care provider if: You have wheezing, shortness of breath, or a cough that is not responding to medicines. Your medicines are causing side effects, such as a rash, itching, swelling, or trouble breathing. You need to use a reliever medicine more than 2-3 times a week. Your peak flow reading is still at 50-79% of your personal best after following your action plan for 1 hour. You have a fever and shortness of breath. Get help right away if: You are getting worse and do not respond to treatment during an asthma attack. You are short of breath when at rest or when doing very little physical activity. You have difficulty eating, drinking, or talking. You have chest pain or tightness. You develop a fast heartbeat or palpitations. You have a bluish color to your lips or fingernails. You are light-headed or dizzy, or you faint. Your peak flow reading is less than 50% of your personal best. You feel too tired to breathe normally. These symptoms may be an emergency. Get help right away. Call 911. Do not wait to see if the symptoms will go away. Do not drive yourself to the hospital. Summary Asthma is a long-term (chronic) condition that causes recurrent episodes in which the airways become tight and narrow. Asthma episodes, also called asthma attacks or asthma flares, can cause coughing, wheezing, shortness of breath, and chest pain. Asthma cannot be cured, but medicines and lifestyle changes can help keep it well controlled and prevent asthma flares. Make sure you understand how to  avoid triggers and how and when to use your medicines. Asthma attacks can range from minor to life-threatening. Get help right away if you have an asthma attack and do not respond to treatment with your usual rescue medicines. This information is not intended to replace advice given to you by your health care provider. Make sure you discuss any questions you have with your health care provider. Document Revised: 06/24/2021 Document Reviewed: 06/15/2021 Elsevier Patient Education  2024 [employer].

## 2024-04-18 NOTE — Progress Notes (Signed)
**Note Chelsea-Identified via Obfuscation**  Subjective  CC:  Chief Complaint  Patient presents with   Asthma    HPI: Chelsea Collins is a 38 y.o. female who presents to the office today to address the problems listed above in the chief complaint. Leina is here for follow-up from visit in the end of May.  Chronic cough.  We treated her with Advair, Flonase  and omeprazole .  She did not take omeprazole  and did not feel the Flonase  made a significant difference, however Advair has significantly improved her cough.  When she takes it twice daily, she is rarely coughing now.  She did not have it for a recent trip last for a week and the cough has returned.  She has had no chest pain or shortness of breath.  No wheezing. See last note Assessment  1. Cough variant asthma   2. Chronic cough   3. Need for pneumococcal vaccination      Plan  Chronic cough, symptoms most consistent with cough variant asthma: Will continue Advair twice daily, add Singulair  10 daily and add Ventolin  as needed.  If remains stable, will continue with his medications.  If not improving, recommend pulmonology evaluation.  Patient agrees.  Prevnar 20 updated today due to new diagnosis  Follow up: For physical as scheduled 05/16/2024  Orders Placed This Encounter  Procedures   Pneumococcal conjugate vaccine 20-valent (Prevnar 20)   Meds ordered this encounter  Medications   fluticasone -salmeterol (ADVAIR) 250-50 MCG/ACT AEPB    Sig: Inhale 1 puff into the lungs in the morning and at bedtime.    Dispense:  180 each    Refill:  3   montelukast  (SINGULAIR ) 10 MG tablet    Sig: Take 1 tablet (10 mg total) by mouth at bedtime.    Dispense:  90 tablet    Refill:  3   albuterol  (VENTOLIN  HFA) 108 (90 Base) MCG/ACT inhaler    Sig: Inhale 2 puffs into the lungs every 4 (four) hours as needed for wheezing or shortness of breath.    Dispense:  1 each    Refill:  2      I reviewed the patients updated PMH, FH, and SocHx.    Patient Active Problem List    Diagnosis Date Noted   Cough variant asthma 04/18/2024   Meniere's disease of left ear 12/22/2021   Sensorineural hearing loss (SNHL) of both ears 12/22/2021   Tinnitus, bilateral 12/22/2021   Chronic cough 09/24/2021   Vision impairment 06/11/2021   Postpartum depression 03/01/2019   Complex partial seizure (HCC) 03/21/2018   Encephalomalacia 03/21/2018   Anxiety disorder 03/21/2018   Epilepsy (HCC) 01/30/2017   Abnormal MRI of head 10/27/2015   History of bacterial meningitis in infancy 10/27/2015   Current Meds  Medication Sig   albuterol  (VENTOLIN  HFA) 108 (90 Base) MCG/ACT inhaler Inhale 2 puffs into the lungs every 4 (four) hours as needed for wheezing or shortness of breath.   meclizine  (ANTIVERT ) 25 MG tablet Take 1 tablet (25 mg total) by mouth 3 (three) times daily as needed for dizziness.   methylphenidate (RITALIN) 10 MG tablet Take 10 mg by mouth 2 (two) times daily with breakfast and lunch.   montelukast  (SINGULAIR ) 10 MG tablet Take 1 tablet (10 mg total) by mouth at bedtime.   Oxcarbazepine  (TRILEPTAL ) 300 MG tablet Take 1 and 1/2 tablets twice a day   [DISCONTINUED] fluticasone -salmeterol (ADVAIR) 250-50 MCG/ACT AEPB Inhale 1 puff into the lungs in the morning and at bedtime.    Allergies:  Patient is allergic to keppra  [levetiracetam ], lamictal  [lamotrigine ], and contrast media [iodinated contrast media]. Family History: Patient family history includes Autism in her brother; Bipolar disorder in her brother; Breast cancer in her maternal grandmother; Cancer in her maternal grandmother; Depression in her mother; Diabetes in her maternal grandmother; Heart attack in her maternal grandfather; Heart disease in her maternal grandfather; Hypertension in her maternal grandfather; Intellectual disability in her brother; Miscarriages / [place] in her mother. Social History:  Patient  reports that she has never smoked. She has never used smokeless tobacco. She reports that she  does not drink alcohol and does not use drugs.  Review of Systems: Constitutional: Negative for fever malaise or anorexia Cardiovascular: negative for chest pain Respiratory: negative for SOB or persistent cough Gastrointestinal: negative for abdominal pain  Objective  Vitals: BP 106/67   Pulse 89   Temp 98.2 F (36.8 C)   Ht 5' 1 (1.549 m)   Wt 116 lb 3.2 oz (52.7 kg)   SpO2 98%   BMI 21.96 kg/m  General: no acute distress , A&Ox3, not coughing today HEENT: PEERL, conjunctiva normal, neck is supple Cardiovascular:  RRR without murmur or gallop.  Respiratory:  Good breath sounds bilaterally, CTAB with normal respiratory effort   Commons side effects, risks, benefits, and alternatives for medications and treatment plan prescribed today were discussed, and the patient expressed understanding of the given instructions. Patient is instructed to call or message via MyChart if he/she has any questions or concerns regarding our treatment plan. No barriers to understanding were identified. We discussed Red Flag symptoms and signs in detail. Patient expressed understanding regarding what to do in case of urgent or emergency type symptoms.  Medication list was reconciled, printed and provided to the patient in AVS. Patient instructions and summary information was reviewed with the patient as documented in the AVS. This note was prepared with assistance of Dragon voice recognition software. Occasional wrong-word or sound-a-like substitutions may have occurred due to the inherent limitations of voice recognition software

## 2024-05-03 DIAGNOSIS — F902 Attention-deficit hyperactivity disorder, combined type: Secondary | ICD-10-CM | POA: Diagnosis not present

## 2024-05-03 DIAGNOSIS — F411 Generalized anxiety disorder: Secondary | ICD-10-CM | POA: Diagnosis not present

## 2024-05-08 ENCOUNTER — Encounter: Admitting: Family Medicine

## 2024-05-08 ENCOUNTER — Ambulatory Visit: Payer: Self-pay | Admitting: Neurology

## 2024-05-08 ENCOUNTER — Encounter: Payer: Self-pay | Admitting: Neurology

## 2024-05-08 VITALS — BP 112/66 | HR 87 | Ht 60.0 in | Wt 115.0 lb

## 2024-05-08 DIAGNOSIS — G40309 Generalized idiopathic epilepsy and epileptic syndromes, not intractable, without status epilepticus: Secondary | ICD-10-CM | POA: Diagnosis not present

## 2024-05-08 MED ORDER — OXCARBAZEPINE 300 MG PO TABS: ORAL_TABLET | ORAL | 3 refills | Status: AC

## 2024-05-08 NOTE — Patient Instructions (Signed)
 It's always good to see you. Have fun at [facility]!  Continue Oxcarbazepine  300mg : take 1 tablet twice a day  2. If seizures continue or increase, we may need to increase Oxcarbazepine  dose  3. Follow-up in 1 year, call for any changes.   Seizure Precautions: 1. If medication has been prescribed for you to prevent seizures, take it exactly as directed.  Do not stop taking the medicine without talking to your doctor first, even if you have not had a seizure in a long time.   2. Avoid activities in which a seizure would cause danger to yourself or to others.  Don't operate dangerous machinery, swim alone, or climb in high or dangerous places, such as on ladders, roofs, or girders.  Do not drive unless your doctor says you may.  3. If you have any warning that you may have a seizure, lay down in a safe place where you can't hurt yourself.    4.  No driving for 6 months from last seizure, as per [place]  state law.   Please refer to the following link on the [employer]'s website for more information: http://www.epilepsyfoundation.org/answerplace/Social/driving/drivingu.cfm   5.  Maintain good sleep hygiene. Avoid alcohol.  6.  Notify your neurology if you are planning pregnancy or if you become pregnant.  7.  Contact your doctor if you have any problems that may be related to the medicine you are taking.  8.  Call 911 and bring the patient back to the ED if:        A.  The seizure lasts longer than 5 minutes.       B.  The patient doesn't awaken shortly after the seizure  C.  The patient has new problems such as difficulty seeing, speaking or moving  D.  The patient was injured during the seizure  E.  The patient has a temperature over 102 F (39C)  F.  The patient vomited and now is having trouble breathing

## 2024-05-08 NOTE — Progress Notes (Signed)
 NEUROLOGY FOLLOW UP OFFICE NOTE  Chelsea Collins [id number] Feb 09, 1986  HISTORY OF PRESENT ILLNESS: I had the pleasure of seeing Chelsea Collins in follow-up in the neurology clinic on 05/08/2024.  The patient was last seen a year ago for seizures. She is alone in the office today.  Records and images were personally reviewed where available.  Since her last visit, she reports having a nocturnal seizure on 09/14/23. She woke up early in the morning thinking she was biting herself, she found she bit the left side of her cheek. She did not notice any body jerking, no incontinence. She was under a lot of anxiety and stress at that time. She contacted our office in 11/2023 asking about increasing Oxcarbazepine  because it also helps with her anxiety and ADHD. Dose increased to 450mg  BID (300mg  1.5 tabs BID). She did not do well as her ADHD medication was also adjusted a couple of week later, she is back to taking 300mg  BID without side effects. The Ritalin BID is going well for her ADHD. She denies any staring/unresponsive episodes, focal numbness/tingling/weakness, myoclonic jerks. She has occasional slight dizziness but has not had the Meniere's attacks. She has been having cough, diagnosed with cough variant asthma.    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 She was diagnosed with epilepsy in childhood when she was having staring spells. She reports that seizures stopped at age 6 and she was taken off seizure medication at age 23. She denies any myoclonic jerks or convulsions. In 2012, she started having episodes where her left leg would become weak for a minute or so, sometimes it felt like her left arm was also affected. She had an MRI brain in [place] reported as showing previous injury to the occipital lobe. She saw a neurologist at that time with concern for MS, she was  reassured there was no evidence of MS. In 2014, she was in a classroom and had an episode where she apparently walked toward the wall and stood there just staring and unresponsive. She was amnestic of the event until her supervisor called her to the office and told her what happened. She recalls another episode that same semester, she was stressed and waiting in the lobby, then did not know how she got there. She had an MRI brain without contrast which did not show any acute changes, there was note of ulceration in the calcarine cortex of both hemispheres, left greater than right, increased FLAIR signal going into the deep while matter with decreased signal centrally. Wake and drowsy EEG was normal. When she left school, she reports episodes were not as bad, she was still having difficulties with her left side and started having troubles with anxiety as well. She would be at a store and would not remember a word or a color. 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 She had to sit on the floor and was pale. When she moved to [place], she started having episodes of feeling hot, nauseated, weird, pale, as well as zoning out (walking into traffic). She saw neurologist Dr. Onita and had a repeat EEG which was abnormal, during hyperventilation which was performed twice, there were  intermittent short bursts of generalized spike sharp wave, indicating a generalized epilepsy disorder. She was started on Lamotrigine  but 5 weeks later had a severe allergic reaction with swollen lymph nodes, night sweats, then rash. She was switched to Keppra  and had a more severe immediate reaction within 1-2 days with rash and facial swelling. She was then switched to Trileptal  which she tolerated well, and reports that all the symptoms she was having went  away. She last saw Dr. Onita in February to discuss Trileptal  and contraceptives, and made the decision to switch to Vimpat . She presents today asking to be switched back to Trileptal , stating that the Vimpat  was not controlling her symptoms. She would have episodes of turning Mangieri, nausea, vision change, staring off into space unresponsive. She was also having headaches with vomiting. Her psychiatrist was concerned that Remeron was also contributing to symptoms, but she had been taking Remeron with Trileptal  without these symptoms.    She also reports episodes where she would have a sudden speech impediment lasting 30 seconds, where she knows what she wants to say but cannot get the words out or form the words. Twice in the past 2 years she has felt a sensation of fear prior to the speech difficulties. She denies any olfactory/gustatory hallucinations, the left-sided symptoms have not recurred since 2014, no myoclonic jerks. She has had headaches every other day when she is late to take her Vimpat  dose, resolving after she takes the medication. Pain is around her eyes with some photosensitivity, no nausea/vomiting. She denies any diplopia, dizziness, neck/back pain, bowel/bladder dysfunction.   Epilepsy Risk Factors:  Bacterial meningitis in infancy with encephalomalacia in the bilateral occipital lobes. Her maternal cousin had seizures in childhood, a maternal uncle had 2 seizures felt to be related to glucose levels. Otherwise she had a normal birth and early development.  There is no history of febrile convulsions, significant traumatic brain injury, neurosurgical procedures.   Prior AEDs: Keppra , Lamotrigine , Trileptal   PAST MEDICAL HISTORY: Past Medical History:  Diagnosis Date   Anxiety disorder    Encephalomalacia 03/21/2018   Occipital bilateral   GAD (generalized anxiety disorder) 03/21/2018   Heat intolerance    Hypoglycemia    Meningitis    Postpartum depression 03/01/2019   Seizures (HCC)     as an infant    MEDICATIONS: Current Outpatient Medications on File Prior to Visit  Medication Sig Dispense Refill   albuterol  (VENTOLIN  HFA) 108 (90 Base) MCG/ACT inhaler Inhale 2 puffs into the lungs every 4 (four) hours as needed for wheezing or shortness of breath. 1 each 2   fluticasone -salmeterol (ADVAIR) 250-50 MCG/ACT AEPB Inhale 1 puff into the lungs in the morning and at bedtime. 180 each 3   meclizine  (ANTIVERT ) 25 MG tablet Take 1 tablet (25 mg total) by mouth 3 (three) times daily as needed for dizziness. 30 tablet 1   methylphenidate (RITALIN) 10 MG tablet Take 10 mg by mouth 2 (two) times daily with breakfast and lunch.     montelukast  (SINGULAIR ) 10 MG tablet Take 1 tablet (10 mg total) by mouth at bedtime. 90 tablet 3   Oxcarbazepine  (TRILEPTAL ) 300 MG tablet Take 1 and 1/2 tablets twice a day 270 tablet 3   No current facility-administered medications on file prior to visit.    ALLERGIES: Allergies  Allergen Reactions   Keppra  [Levetiracetam ] Swelling and Rash   Lamictal  [Lamotrigine ] Rash   Contrast Media [Iodinated Contrast Media] Cough    MRI contrast NOT CT  FAMILY HISTORY: Family History  Problem Relation Age of Onset   Depression Mother    Miscarriages / [place] Mother    Intellectual disability Brother    Bipolar disorder Brother    Autism Brother    Diabetes Maternal Grandmother    Cancer Maternal Grandmother    Breast cancer Maternal Grandmother    Hypertension Maternal Grandfather    Heart disease Maternal Grandfather    Heart attack Maternal Grandfather     SOCIAL HISTORY: Social History   Socioeconomic History   Marital status: Married    Spouse name: Not on file   Number of children: 1   Years of education: 14   Highest education level: Not on file  Occupational History    Comment: [employer]  Tobacco Use   Smoking status: Never   Smokeless tobacco: Never  Vaping Use   Vaping status: Never Used  Substance and Sexual Activity    Alcohol use: No    Alcohol/week: 0.0 standard drinks of alcohol   Drug use: No   Sexual activity: Yes    Partners: Male    Birth control/protection: Condom  Other Topics Concern   Not on file  Social History Narrative   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   Two 12oz cans of soda daily.   Social Drivers of Corporate investment banker Strain: Low Risk  (09/21/2018)   Overall Financial Resource Strain (CARDIA)    Difficulty of Paying Living Expenses: Not hard at all  Food Insecurity: Low Risk  (04/13/2023)   Received from [facility]   Hunger Vital Sign    Within the past 12 months, you worried that your food would run out before you got money to buy more: Never true    Within the past 12 months, the food you bought just didn't last and you didn't have money to get more. : Never true  Transportation Needs: Unknown (09/21/2018)   PRAPARE - Administrator, Civil Service (Medical): No    Lack of Transportation (Non-Medical): Not on file  Physical Activity: Not on file  Stress: No Stress Concern Present (09/21/2018)   [employer] of Occupational Health - Occupational Stress Questionnaire    Feeling of Stress : Not at all  Social Connections: Not on file  Intimate Partner Violence: Not At Risk (09/21/2018)   Humiliation, Afraid, Rape, and Kick questionnaire    Fear of Current or Ex-Partner: No    Emotionally Abused: No    Physically Abused: No    Sexually Abused: No     PHYSICAL EXAM: Vitals:   05/08/24 1018  BP: 112/66  Pulse: 87  SpO2: 96%   General: No acute distress Head:  Normocephalic/atraumatic Skin/Extremities: No rash, no edema Neurological Exam: alert and awake. No aphasia or dysarthria. Fund of knowledge is appropriate.  Attention and concentration are normal.   Cranial nerves: Pupils equal, round. Extraocular movements intact except for left eye abduction and left esotropia (chronic), no nystagmus. Restricted visual fields, more  on the left (chronic). No facial asymmetry.  Motor: Bulk and tone normal, muscle strength 5/5 throughout with no pronator drift.   Finger to nose testing intact.  Gait narrow-based and steady, able to tandem walk adequately.  Romberg negative.   IMPRESSION: This is a pleasant 38 yo RH woman with a history of ADHD, anxiety, bacterial meningitis in infancy with bilateral occipital encephalomalacia, seizures in childhood with staring spells that initially stopped at age 21 but recurred in 2014.  She also started having recurrent episodes of left-sided weakness in 2014 that had quieted down. She had recurrent episodes of becoming pale, nauseated, with staring spells that stopped after initiation of oxcarbazepine . EEG showed generalized discharges during hyperventilation suggestive of a generalized epilepsy. She was seizure-free for 6 years until a possible nocturnal seizure 09/14/23 in the setting of significant stress. We agreed to continue on current dose of Oxcarbazepine  300mg  BID, low threshold to increase if seizures increase. She does not drive. Follow-up in 1 year, call for any changes.    Thank you for allowing me to participate in her care.  Please do not hesitate to call for any questions or concerns.    Darice Shivers, M.D.   CC: Dr. Jodie

## 2024-05-09 ENCOUNTER — Other Ambulatory Visit (HOSPITAL_BASED_OUTPATIENT_CLINIC_OR_DEPARTMENT_OTHER): Payer: Self-pay

## 2024-05-11 DIAGNOSIS — F411 Generalized anxiety disorder: Secondary | ICD-10-CM | POA: Diagnosis not present

## 2024-05-11 DIAGNOSIS — F902 Attention-deficit hyperactivity disorder, combined type: Secondary | ICD-10-CM | POA: Diagnosis not present

## 2024-05-16 ENCOUNTER — Encounter: Admitting: Family Medicine

## 2024-05-22 ENCOUNTER — Other Ambulatory Visit (HOSPITAL_BASED_OUTPATIENT_CLINIC_OR_DEPARTMENT_OTHER): Payer: Self-pay

## 2024-06-21 DIAGNOSIS — F902 Attention-deficit hyperactivity disorder, combined type: Secondary | ICD-10-CM | POA: Diagnosis not present

## 2024-06-21 DIAGNOSIS — F411 Generalized anxiety disorder: Secondary | ICD-10-CM | POA: Diagnosis not present

## 2024-07-03 DIAGNOSIS — H53483 Generalized contraction of visual field, bilateral: Secondary | ICD-10-CM | POA: Diagnosis not present

## 2024-07-18 DIAGNOSIS — H9313 Tinnitus, bilateral: Secondary | ICD-10-CM | POA: Diagnosis not present

## 2024-07-18 DIAGNOSIS — H8102 Meniere's disease, left ear: Secondary | ICD-10-CM | POA: Diagnosis not present

## 2024-07-18 DIAGNOSIS — H9042 Sensorineural hearing loss, unilateral, left ear, with unrestricted hearing on the contralateral side: Secondary | ICD-10-CM | POA: Diagnosis not present

## 2024-07-18 DIAGNOSIS — H903 Sensorineural hearing loss, bilateral: Secondary | ICD-10-CM | POA: Diagnosis not present

## 2024-08-08 ENCOUNTER — Encounter: Payer: Self-pay | Admitting: Neurology

## 2024-08-08 DIAGNOSIS — F411 Generalized anxiety disorder: Secondary | ICD-10-CM | POA: Diagnosis not present

## 2024-08-08 DIAGNOSIS — F902 Attention-deficit hyperactivity disorder, combined type: Secondary | ICD-10-CM | POA: Diagnosis not present

## 2024-08-10 DIAGNOSIS — F411 Generalized anxiety disorder: Secondary | ICD-10-CM | POA: Diagnosis not present

## 2024-08-10 DIAGNOSIS — F902 Attention-deficit hyperactivity disorder, combined type: Secondary | ICD-10-CM | POA: Diagnosis not present

## 2025-05-08 ENCOUNTER — Ambulatory Visit: Admitting: Neurology
